# Patient Record
Sex: Female | Born: 1935 | Race: White | Hispanic: No | Marital: Single | Smoking: Never smoker
Health system: Southern US, Community
[De-identification: ages and names within clinical notes are randomized; demographics above are authoritative.]

## PROBLEM LIST (undated history)

## (undated) DIAGNOSIS — K219 Gastro-esophageal reflux disease without esophagitis: Secondary | ICD-10-CM

## (undated) DIAGNOSIS — M1990 Unspecified osteoarthritis, unspecified site: Secondary | ICD-10-CM

## (undated) DIAGNOSIS — R140 Abdominal distension (gaseous): Secondary | ICD-10-CM

## (undated) DIAGNOSIS — R410 Disorientation, unspecified: Secondary | ICD-10-CM

## (undated) DIAGNOSIS — Z923 Personal history of irradiation: Secondary | ICD-10-CM

## (undated) DIAGNOSIS — R519 Headache, unspecified: Secondary | ICD-10-CM

## (undated) DIAGNOSIS — R51 Headache: Secondary | ICD-10-CM

## (undated) DIAGNOSIS — I493 Ventricular premature depolarization: Secondary | ICD-10-CM

## (undated) DIAGNOSIS — R002 Palpitations: Secondary | ICD-10-CM

## (undated) DIAGNOSIS — G4730 Sleep apnea, unspecified: Secondary | ICD-10-CM

## (undated) DIAGNOSIS — I10 Essential (primary) hypertension: Secondary | ICD-10-CM

## (undated) DIAGNOSIS — D0590 Unspecified type of carcinoma in situ of unspecified breast: Principal | ICD-10-CM

## (undated) DIAGNOSIS — F419 Anxiety disorder, unspecified: Secondary | ICD-10-CM

## (undated) DIAGNOSIS — R413 Other amnesia: Secondary | ICD-10-CM

## (undated) DIAGNOSIS — F329 Major depressive disorder, single episode, unspecified: Secondary | ICD-10-CM

## (undated) DIAGNOSIS — C801 Malignant (primary) neoplasm, unspecified: Secondary | ICD-10-CM

## (undated) DIAGNOSIS — K6289 Other specified diseases of anus and rectum: Secondary | ICD-10-CM

## (undated) DIAGNOSIS — E785 Hyperlipidemia, unspecified: Secondary | ICD-10-CM

## (undated) DIAGNOSIS — F32A Depression, unspecified: Secondary | ICD-10-CM

## (undated) HISTORY — DX: Headache, unspecified: R51.9

## (undated) HISTORY — DX: Hyperlipidemia, unspecified: E78.5

## (undated) HISTORY — DX: Unspecified type of carcinoma in situ of unspecified breast: D05.90

## (undated) HISTORY — DX: Other amnesia: R41.3

## (undated) HISTORY — DX: Major depressive disorder, single episode, unspecified: F32.9

## (undated) HISTORY — DX: Headache: R51

## (undated) HISTORY — DX: Anxiety disorder, unspecified: F41.9

## (undated) HISTORY — DX: Ventricular premature depolarization: I49.3

## (undated) HISTORY — DX: Disorientation, unspecified: R41.0

## (undated) HISTORY — DX: Other specified diseases of anus and rectum: K62.89

## (undated) HISTORY — DX: Depression, unspecified: F32.A

## (undated) HISTORY — PX: BREAST BIOPSY: SHX20

## (undated) HISTORY — DX: Essential (primary) hypertension: I10

## (undated) HISTORY — DX: Unspecified osteoarthritis, unspecified site: M19.90

## (undated) HISTORY — DX: Palpitations: R00.2

## (undated) HISTORY — DX: Gastro-esophageal reflux disease without esophagitis: K21.9

## (undated) HISTORY — DX: Abdominal distension (gaseous): R14.0

## (undated) HISTORY — DX: Sleep apnea, unspecified: G47.30

---

## 1976-09-11 HISTORY — PX: ABDOMINAL HYSTERECTOMY: SHX81

## 2002-03-05 ENCOUNTER — Ambulatory Visit (HOSPITAL_BASED_OUTPATIENT_CLINIC_OR_DEPARTMENT_OTHER): Admission: RE | Admit: 2002-03-05 | Discharge: 2002-03-05 | Payer: Self-pay | Admitting: *Deleted

## 2002-03-05 ENCOUNTER — Encounter (INDEPENDENT_AMBULATORY_CARE_PROVIDER_SITE_OTHER): Payer: Self-pay | Admitting: Specialist

## 2003-06-25 ENCOUNTER — Encounter: Payer: Self-pay | Admitting: Gastroenterology

## 2003-06-25 ENCOUNTER — Encounter: Admission: RE | Admit: 2003-06-25 | Discharge: 2003-06-25 | Payer: Self-pay | Admitting: Gastroenterology

## 2003-09-12 HISTORY — PX: FOOT SURGERY: SHX648

## 2003-10-13 ENCOUNTER — Ambulatory Visit (HOSPITAL_COMMUNITY): Admission: RE | Admit: 2003-10-13 | Discharge: 2003-10-13 | Payer: Self-pay | Admitting: Radiology

## 2004-02-09 ENCOUNTER — Encounter: Admission: RE | Admit: 2004-02-09 | Discharge: 2004-02-09 | Payer: Self-pay | Admitting: Gastroenterology

## 2005-09-11 HISTORY — PX: ROTATOR CUFF REPAIR: SHX139

## 2006-01-03 ENCOUNTER — Ambulatory Visit: Payer: Self-pay | Admitting: Family Medicine

## 2006-04-13 ENCOUNTER — Ambulatory Visit (HOSPITAL_BASED_OUTPATIENT_CLINIC_OR_DEPARTMENT_OTHER): Admission: RE | Admit: 2006-04-13 | Discharge: 2006-04-14 | Payer: Self-pay | Admitting: Orthopedic Surgery

## 2009-02-05 DIAGNOSIS — E7800 Pure hypercholesterolemia, unspecified: Secondary | ICD-10-CM | POA: Insufficient documentation

## 2009-02-05 DIAGNOSIS — I10 Essential (primary) hypertension: Secondary | ICD-10-CM | POA: Insufficient documentation

## 2009-02-05 DIAGNOSIS — F419 Anxiety disorder, unspecified: Secondary | ICD-10-CM | POA: Insufficient documentation

## 2009-09-11 HISTORY — PX: KIDNEY STONE SURGERY: SHX686

## 2009-09-11 HISTORY — PX: CATARACT EXTRACTION: SUR2

## 2010-07-12 ENCOUNTER — Ambulatory Visit (HOSPITAL_BASED_OUTPATIENT_CLINIC_OR_DEPARTMENT_OTHER): Admission: RE | Admit: 2010-07-12 | Discharge: 2010-07-12 | Payer: Self-pay | Admitting: Urology

## 2010-09-11 DIAGNOSIS — C50919 Malignant neoplasm of unspecified site of unspecified female breast: Secondary | ICD-10-CM

## 2010-09-11 HISTORY — DX: Malignant neoplasm of unspecified site of unspecified female breast: C50.919

## 2010-09-21 ENCOUNTER — Other Ambulatory Visit: Payer: Self-pay | Admitting: Radiology

## 2010-09-27 ENCOUNTER — Encounter
Admission: RE | Admit: 2010-09-27 | Discharge: 2010-09-27 | Payer: Self-pay | Source: Home / Self Care | Attending: Radiology | Admitting: Radiology

## 2010-10-12 HISTORY — PX: OTHER SURGICAL HISTORY: SHX169

## 2010-10-17 ENCOUNTER — Ambulatory Visit (HOSPITAL_COMMUNITY)
Admission: RE | Admit: 2010-10-17 | Discharge: 2010-10-17 | Disposition: A | Discharge status: Home / Self Care | Payer: Medicare Other | Source: Ambulatory Visit | Attending: General Surgery | Admitting: General Surgery

## 2010-10-17 ENCOUNTER — Encounter (HOSPITAL_COMMUNITY)
Admission: RE | Admit: 2010-10-17 | Discharge: 2010-10-17 | Disposition: A | Discharge status: Home / Self Care | Payer: Medicare Other | Source: Ambulatory Visit | Attending: General Surgery | Admitting: General Surgery

## 2010-10-17 ENCOUNTER — Other Ambulatory Visit (HOSPITAL_COMMUNITY): Payer: Self-pay

## 2010-10-17 ENCOUNTER — Other Ambulatory Visit (HOSPITAL_COMMUNITY): Payer: Self-pay | Admitting: General Surgery

## 2010-10-17 DIAGNOSIS — Z01811 Encounter for preprocedural respiratory examination: Secondary | ICD-10-CM

## 2010-10-17 DIAGNOSIS — C50919 Malignant neoplasm of unspecified site of unspecified female breast: Secondary | ICD-10-CM | POA: Insufficient documentation

## 2010-10-17 LAB — URINALYSIS, ROUTINE W REFLEX MICROSCOPIC
Bilirubin Urine: NEGATIVE
Ketones, ur: NEGATIVE mg/dL
Nitrite: NEGATIVE
Protein, ur: NEGATIVE mg/dL
Urine Glucose, Fasting: NEGATIVE mg/dL

## 2010-10-17 LAB — COMPREHENSIVE METABOLIC PANEL
ALT: 18 U/L (ref 0–35)
Alkaline Phosphatase: 76 U/L (ref 39–117)
CO2: 30 mEq/L (ref 19–32)
GFR calc non Af Amer: >60 mL/min (ref >60)
Glucose, Bld: 121 mg/dL — ABNORMAL HIGH (ref 70–99)
Potassium: 4.6 mEq/L (ref 3.5–5.1)
Sodium: 143 mEq/L (ref 135–145)

## 2010-10-17 LAB — CBC
HCT: 37.5 % (ref 36.0–46.0)
Hemoglobin: 12.7 g/dL (ref 12.0–15.0)
RBC: 4.28 MIL/uL (ref 3.87–5.11)
WBC: 6.5 10*3/uL (ref 4.0–10.5)

## 2010-10-17 LAB — DIFFERENTIAL
Basophils Absolute: 0 10*3/uL (ref 0.0–0.1)
Lymphocytes Relative: 20 % (ref 12–46)
Neutro Abs: 4.6 10*3/uL (ref 1.7–7.7)

## 2010-10-18 ENCOUNTER — Ambulatory Visit (HOSPITAL_COMMUNITY)
Admission: RE | Admit: 2010-10-18 | Discharge: 2010-10-18 | Disposition: A | Discharge status: Home / Self Care | Payer: Medicare Other | Source: Ambulatory Visit | Attending: General Surgery | Admitting: General Surgery

## 2010-10-18 DIAGNOSIS — K219 Gastro-esophageal reflux disease without esophagitis: Secondary | ICD-10-CM | POA: Insufficient documentation

## 2010-10-18 DIAGNOSIS — I10 Essential (primary) hypertension: Secondary | ICD-10-CM | POA: Insufficient documentation

## 2010-10-18 DIAGNOSIS — G4733 Obstructive sleep apnea (adult) (pediatric): Secondary | ICD-10-CM | POA: Insufficient documentation

## 2010-10-18 DIAGNOSIS — D0590 Unspecified type of carcinoma in situ of unspecified breast: Secondary | ICD-10-CM | POA: Insufficient documentation

## 2010-10-19 ENCOUNTER — Other Ambulatory Visit: Payer: Self-pay | Admitting: General Surgery

## 2010-10-19 ENCOUNTER — Other Ambulatory Visit (HOSPITAL_COMMUNITY): Payer: Self-pay | Admitting: General Surgery

## 2010-10-19 DIAGNOSIS — R911 Solitary pulmonary nodule: Secondary | ICD-10-CM

## 2010-10-21 ENCOUNTER — Other Ambulatory Visit (HOSPITAL_COMMUNITY): Payer: Self-pay | Admitting: General Surgery

## 2010-10-21 ENCOUNTER — Ambulatory Visit (HOSPITAL_COMMUNITY)
Admission: RE | Admit: 2010-10-21 | Discharge: 2010-10-21 | Disposition: A | Discharge status: Home / Self Care | Payer: Medicare Other | Source: Ambulatory Visit | Attending: General Surgery | Admitting: General Surgery

## 2010-10-21 ENCOUNTER — Encounter (HOSPITAL_COMMUNITY): Payer: Self-pay

## 2010-10-21 DIAGNOSIS — J984 Other disorders of lung: Secondary | ICD-10-CM | POA: Insufficient documentation

## 2010-10-21 DIAGNOSIS — I2510 Atherosclerotic heart disease of native coronary artery without angina pectoris: Secondary | ICD-10-CM | POA: Insufficient documentation

## 2010-10-21 DIAGNOSIS — R911 Solitary pulmonary nodule: Secondary | ICD-10-CM

## 2010-10-21 HISTORY — DX: Essential (primary) hypertension: I10

## 2010-10-21 HISTORY — DX: Malignant (primary) neoplasm, unspecified: C80.1

## 2010-10-21 MED ORDER — IOHEXOL 300 MG/ML  SOLN
80.0000 mL | Freq: Once | INTRAMUSCULAR | Status: AC | PRN
  Administered 2010-10-21: 80 mL via INTRAVENOUS

## 2010-10-23 NOTE — Op Note (Signed)
Brandi Hanson, Brandi Hanson NO.:  [id number]  MEDICAL RECORD NO.:  [id number]           PATIENT TYPE:  LOCATION:                                 FACILITY:  PHYSICIAN:  Angelia Mould. Derrell Lolling, M.D.DATE OF BIRTH:  1935/10/21  DATE OF PROCEDURE:  10/18/2010 DATE OF DISCHARGE:                              OPERATIVE REPORT   PREOPERATIVE DIAGNOSIS:  Ductal carcinoma in situ, left breast, stage Tis N0.  POSTOPERATIVE DIAGNOSIS:  Ductal carcinoma in situ, left breast, stage Tis N0.  OPERATIONS PERFORMED: 1. Left partial mastectomy with needle localization and specimen     mammogram. 2. Re-excision of medial margin.  SURGEON:  Angelia Mould. Derrell Lolling, MD  OPERATIVE INDICATIONS:  This is a 75 year old Caucasian female who has had a couple of breast biopsies in the past but no prior cancer.  Recent screening mammogram showed a 4-mm area of microcalcifications in the central left breast in the retroareolar area.  This was a very small area.  Core biopsy showed a ductal carcinoma in situ which was receptor positive.  I have discussed her care on two occasions with her.  She had an MRI which showed a biopsy cavity measuring 2 cm x 1.6 cm and a biopsy clip in appropriate area immediately adjacent to the microcalcifications.  This was in the left retroareolar area at the junction of the middle and posterior one third of breast.  She would like to try for breast conservation and is brought to the operating room electively.  OPERATIVE TECHNIQUE:  The patient underwent wire localization by Dr. Rogelia Mire at the [facility].  The wire entered laterally and was directed medially and went right through the marker clip in the area of microcalcifications and was well placed.  The patient was brought to the [facility] Operating Room.  She was taken to the operating room where general anesthesia was induced.  The left breast was prepped and draped in a sterile fashion.  Intravenous  antibiotics were given.  A surgical time-out was held identifying correct patient, correct procedure, and correct site.  I reviewed the x-rays and put them up to review them.  I felt that the best incision was going to be a curved transverse circumareolar incision and I placed it superiorly where the most lateral aspect of the incision was near the insertion site of the wire.  This incision was made. Dissection was carried down into the breast tissue, and I did fairly extensive dissection medially and took the dissection all the way back to the chest wall in most places.  When I came down 2 cm medial to the nipple, I transected the very tip of the wire.  I removed the specimen and marked it with a 6-color margin marker kit.  I also re-excised the medial margin taking about 2 cm x 3 cm rectangle of tissue and also marked it with the yellow color for the medial margin and marked it as re-excision medial margin, yellow color indicating new medial margin.  I placed both of these specimens in the Faxitron and looked at the pictures.  It looked like the marker  clip and the microcalcifications were actually in the main specimen but saw the hook of the wire in the second specimen.  These were sent to Dr. Tilda Burrow at [facility] and we discussed the case.  We felt that we had removed everything that needed to be removed.  The specimens were sent with careful notations to the pathologist.  Hemostasis was excellent and achieved with electrocautery.  The wound was irrigated with saline.  The deeper breast tissues were closed in layers with interrupted sutures of 3-0 Vicryl and the skin closed with a running subcuticular suture of 4-0 Monocryl and Dermabond.  Ice pack was placed.  The patient tolerated the procedure well and was taken to recovery room in stable condition.  Estimated blood loss was about 10-15 mL.  Complications none.  Sponge, needles, and instrument counts  were correct.     Angelia Mould. Derrell Lolling, M.D.     HMI/MEDQ  D:  10/18/2010  T:  10/19/2010  Job:  [id number]  cc:   Harrietta Guardian. Little, M.D. Dr. Rogelia Mire  Electronically Signed by Claud Kelp M.D. on 10/23/2010 03:59:21 PM

## 2010-11-02 ENCOUNTER — Encounter (HOSPITAL_BASED_OUTPATIENT_CLINIC_OR_DEPARTMENT_OTHER): Payer: Medicare Other | Admitting: Oncology

## 2010-11-02 ENCOUNTER — Encounter: Payer: Medicare Other | Admitting: Oncology

## 2010-11-02 DIAGNOSIS — C50119 Malignant neoplasm of central portion of unspecified female breast: Secondary | ICD-10-CM

## 2010-11-18 ENCOUNTER — Ambulatory Visit: Payer: Medicare Other | Attending: Radiation Oncology | Admitting: Radiation Oncology

## 2010-11-18 DIAGNOSIS — L538 Other specified erythematous conditions: Secondary | ICD-10-CM | POA: Insufficient documentation

## 2010-11-18 DIAGNOSIS — C50119 Malignant neoplasm of central portion of unspecified female breast: Secondary | ICD-10-CM | POA: Insufficient documentation

## 2010-11-18 DIAGNOSIS — Z510 Encounter for antineoplastic radiation therapy: Secondary | ICD-10-CM | POA: Insufficient documentation

## 2010-11-18 DIAGNOSIS — M25519 Pain in unspecified shoulder: Secondary | ICD-10-CM | POA: Insufficient documentation

## 2010-11-22 LAB — POCT I-STAT 4, (NA,K, GLUC, HGB,HCT)
HCT: 41 % (ref 36.0–46.0)
Hemoglobin: 13.9 g/dL (ref 12.0–15.0)
Potassium: 4.1 mEq/L (ref 3.5–5.1)

## 2010-12-27 ENCOUNTER — Other Ambulatory Visit: Payer: Self-pay | Admitting: Oncology

## 2010-12-27 ENCOUNTER — Encounter (HOSPITAL_BASED_OUTPATIENT_CLINIC_OR_DEPARTMENT_OTHER): Payer: Medicare Other | Admitting: Oncology

## 2010-12-27 DIAGNOSIS — C50119 Malignant neoplasm of central portion of unspecified female breast: Secondary | ICD-10-CM

## 2010-12-27 DIAGNOSIS — D0590 Unspecified type of carcinoma in situ of unspecified breast: Secondary | ICD-10-CM

## 2010-12-27 DIAGNOSIS — Z170 Estrogen receptor positive status [ER+]: Secondary | ICD-10-CM

## 2010-12-27 LAB — CBC WITH DIFFERENTIAL/PLATELET
BASO%: 0.3 % (ref 0.0–2.0)
EOS%: 1.7 % (ref 0.0–7.0)
HCT: 35.7 % (ref 34.8–46.6)
MCHC: 34.6 g/dL (ref 31.5–36.0)
MONO#: 0.6 10*3/uL (ref 0.1–0.9)
NEUT%: 73 % (ref 38.4–76.8)
RDW: 12.6 % (ref 11.2–14.5)
WBC: 4.2 10*3/uL (ref 3.9–10.3)
lymph#: 0.5 10*3/uL — ABNORMAL LOW (ref 0.9–3.3)

## 2010-12-27 LAB — COMPREHENSIVE METABOLIC PANEL
ALT: 23 U/L (ref 0–35)
AST: 27 U/L (ref 0–37)
Albumin: 4.2 g/dL (ref 3.5–5.2)
CO2: 31 mEq/L (ref 19–32)
Calcium: 9.6 mg/dL (ref 8.4–10.5)
Chloride: 103 mEq/L (ref 96–112)
Potassium: 4.4 mEq/L (ref 3.5–5.3)
Sodium: 140 mEq/L (ref 135–145)
Total Protein: 6.2 g/dL (ref 6.0–8.3)

## 2010-12-27 LAB — LACTATE DEHYDROGENASE: LDH: 147 U/L (ref 94–250)

## 2011-01-27 NOTE — Op Note (Signed)
NAMELAQUESHIA, CIHLAR NO.:  [id number]   MEDICAL RECORD NO.:  [id number]          PATIENT TYPE:  AMB   LOCATION:  DSC                          FACILITY:  [facility]   PHYSICIAN:  Dyke Brackett, M.D.    DATE OF BIRTH:  [birth year]   DATE OF PROCEDURE:  04/13/2006  DATE OF DISCHARGE:  04/14/2006                                 OPERATIVE REPORT   PREOPERATIVE DIAGNOSIS:  Large rotator cuff tear, right shoulder.   POSTOPERATIVE DIAGNOSIS:  Large rotator cuff tear, right shoulder.   OPERATION:  1. Right shoulder open rotator cuff repair with acromioplasty.  2. Arthroscopic debridement, labrum.  3. Open distal clavicle excision.   SURGEON:  Dyke Brackett, M.D.   ASSISTANT:  Arlys John D. Petrarca, P.A.-C.   DESCRIPTION OF PROCEDURE:  Sterile prep and drape beach chair, posterior and  anterior portals created.  A large rotator cuff tear identified with  complete tear of the supraspinatus and significant tear.  Complete but not  full width thickness of the infraspinatus was noted with significant  retraction.  Degenerative tearing of the anterior superior labrum was noted  and debrided separate from the open procedure.  No significant glenohumeral  degenerative change was seen.   Procedure was next converted to an open procedure with excision of a very  prominent acromion and AC joint area followed by exposure of the large tear,  mobilization and freshening with a 15 blade.  The 5.5 Arthrex anchors were  used to create essentially a watertight repair, with 2 to 3 anchors, for a  total of about 6 sutures.  __________ the tear was more of an L-shaped tear  that was converted basically to anatomic repair under no significant  tension.  Closure was affected with #1 Tycron on the deltoid, #2-0 Vicryl on  the subcutaneous tissues, a light compressive sterile dressing and sling  applied.  Taken to the recovery room in stable condition.      Dyke Brackett, M.D.  Electronically  Signed     WDC/MEDQ  D:  05/16/2006  T:  05/16/2006  Job:  [id number]

## 2011-02-23 ENCOUNTER — Ambulatory Visit
Admission: RE | Admit: 2011-02-23 | Discharge: 2011-02-23 | Disposition: A | Discharge status: Home / Self Care | Payer: Medicare Other | Source: Ambulatory Visit | Attending: Radiation Oncology | Admitting: Radiation Oncology

## 2011-02-23 DIAGNOSIS — C50119 Malignant neoplasm of central portion of unspecified female breast: Secondary | ICD-10-CM

## 2011-02-23 LAB — COMPREHENSIVE METABOLIC PANEL
ALT: 13 U/L (ref 0–35)
AST: 17 U/L (ref 0–37)
Albumin: 4.2 g/dL (ref 3.5–5.2)
Alkaline Phosphatase: 76 U/L (ref 39–117)
BUN: 19 mg/dL (ref 6–23)
Calcium: 9.3 mg/dL (ref 8.4–10.5)
Chloride: 103 mEq/L (ref 96–112)
Creatinine, Ser: 0.66 mg/dL (ref 0.50–1.10)
Potassium: 4.2 mEq/L (ref 3.5–5.3)

## 2011-02-23 LAB — CBC WITH DIFFERENTIAL/PLATELET
BASO%: 0.4 % (ref 0.0–2.0)
EOS%: 0.8 % (ref 0.0–7.0)
MCH: 30.6 pg (ref 25.1–34.0)
MCHC: 34.5 g/dL (ref 31.5–36.0)
MCV: 88.6 fL (ref 79.5–101.0)
MONO%: 12.3 % (ref 0.0–14.0)
RDW: 12.4 % (ref 11.2–14.5)
lymph#: 0.6 10*3/uL — ABNORMAL LOW (ref 0.9–3.3)

## 2011-03-07 ENCOUNTER — Encounter (HOSPITAL_BASED_OUTPATIENT_CLINIC_OR_DEPARTMENT_OTHER): Payer: Medicare Other | Admitting: Oncology

## 2011-03-07 DIAGNOSIS — Z170 Estrogen receptor positive status [ER+]: Secondary | ICD-10-CM

## 2011-03-07 DIAGNOSIS — D0590 Unspecified type of carcinoma in situ of unspecified breast: Secondary | ICD-10-CM

## 2011-05-19 ENCOUNTER — Telehealth (INDEPENDENT_AMBULATORY_CARE_PROVIDER_SITE_OTHER): Payer: Self-pay

## 2011-05-19 NOTE — Telephone Encounter (Signed)
Pt called c/o of bloody nipple d/c right breast. Last imaging done was mri done jan 2012/ mgm done in 2011. Pt advised I will review this with Dr Derrell Lolling on Monday to see if he wants imaging possible ductogram prior to appt 9-24/

## 2011-05-22 ENCOUNTER — Telehealth (INDEPENDENT_AMBULATORY_CARE_PROVIDER_SITE_OTHER): Payer: Self-pay

## 2011-05-22 DIAGNOSIS — N6452 Nipple discharge: Secondary | ICD-10-CM

## 2011-05-22 NOTE — Telephone Encounter (Signed)
Pt was called and advised per Dr Derrell Lolling, prior to ov 9-24 she will need mgm,ultrasound and poss ductogram set up at [facility]. Pt wishes to proceed with these test.

## 2011-05-30 ENCOUNTER — Other Ambulatory Visit: Payer: Self-pay | Admitting: Radiology

## 2011-06-02 ENCOUNTER — Encounter (INDEPENDENT_AMBULATORY_CARE_PROVIDER_SITE_OTHER): Payer: Self-pay | Admitting: General Surgery

## 2011-06-05 ENCOUNTER — Encounter (INDEPENDENT_AMBULATORY_CARE_PROVIDER_SITE_OTHER): Payer: Self-pay | Admitting: General Surgery

## 2011-06-05 ENCOUNTER — Ambulatory Visit (INDEPENDENT_AMBULATORY_CARE_PROVIDER_SITE_OTHER): Payer: Medicare Other | Admitting: General Surgery

## 2011-06-05 VITALS — BP 128/68 | HR 60 | Temp 98.0°F | Resp 14 | Ht 67.0 in | Wt 145.0 lb

## 2011-06-05 DIAGNOSIS — N6452 Nipple discharge: Secondary | ICD-10-CM

## 2011-06-05 DIAGNOSIS — N6459 Other signs and symptoms in breast: Secondary | ICD-10-CM

## 2011-06-05 NOTE — Patient Instructions (Signed)
The small density seen in your right breast appears to be completely benign. Since the bleeding happened only once, I do not think that we need to do a biopsy at this time. I will see you back in  in 6 months after you get mammograms and ultrasounds.

## 2011-06-05 NOTE — Progress Notes (Signed)
Subjective:     Patient ID: Brandi Hanson, female   DOB: Dec 03, 1935, 75 y.o.   MRN: [id number]  HPI This is a 75 year old Caucasian female. She is seen in the office today upon referral by Brandi Hanson. She is being evaluated because of a nipple discharge in the right breast.  The patient has a significant history of left partial mastectomy on October 18, 2010. She had high-grade ductal carcinoma in situ, 1.5 cm, ER positive, PR negative. She underwent adjuvant radiation therapy by Dr. Michell Hanson. She is taking Femara and being followed by Dr. Cephas Hanson. She has no complaints about the left breast.  In terms of the right breast, she states that on May 16, 2011 she saw a spot of dark fluid in her bra. She assumes that she had a discharge from the right nipple. She has had no more discharge since that time. She does not feel a mass and is not have any pain.  Bilateral mammograms and bilateral ultrasounds were performed by Brandi Hanson on May 29, 2011. It was felt that there was a 4 mm mass immediately adjacent to the right nipple. She felt this appeared to be located within a duct. The following day she performed a biopsy of this area and felt that she got adequate tissue. The surgical pathology shows benign adipose tissue, no evidence of atypia or malignancy. After Brandi Hanson felt that the results were concordant with the imaging findings.  Patient is here for my opinion.  Past Medical History  Diagnosis Date  . Hypertension   . PVC (premature ventricular contraction)   . Hyperlipidemia   . Arthritis   . Sleep apnea   . GERD (gastroesophageal reflux disease)   . Cancer     skin  . Anxiety   . Osteoporosis   . Abdominal pain   . Abdominal distention   . Palpitations   . Generalized headaches   . Confusion   . Rectal pain   . Current Outpatient Prescriptions  Medication Sig Dispense Refill  . acetaminophen (TYLENOL) 500 MG tablet Take 500 mg by mouth every 6  (six) hours as needed.        Marland Kitchen aspirin 81 MG tablet Take 81 mg by mouth daily.        Marland Kitchen CALCIUM CITRATE-VITAMIN D PO Take by mouth daily.        . Cholecalciferol (VITAMIN D3) 1000 UNITS CAPS Take by mouth daily.        . clidinium-chlordiazePOXIDE (LIBRAX) 2.5-5 MG per capsule Take 1 capsule by mouth 2 (two) times daily.        . Coenzyme Q10 (COQ10) 100 MG CAPS Take by mouth daily.        Marland Kitchen letrozole (FEMARA) 2.5 MG tablet       . lisinopril (PRINIVIL,ZESTRIL) 10 MG tablet Take 10 mg by mouth daily.        . Multiple Vitamin (STRESS B) TABS Take by mouth daily.        . Naproxen Sodium (ALEVE PO) Take by mouth as needed.        Marland Kitchen omeprazole (PRILOSEC) 20 MG capsule Take 20 mg by mouth daily.        . potassium citrate (UROCIT-K) 10 MEQ (1080 MG) SR tablet Take 10 mEq by mouth 2 (two) times daily.        . simvastatin (ZOCOR) 20 MG tablet Take 20 mg by mouth every other day.         No Known Allergies  Review of Systems 12 system review of systems is performed and is negative except as described above.     Objective:   Physical Exam  Constitutional: She appears well-developed and well-nourished. No distress.  Eyes: Conjunctivae are normal. Pupils are equal, round, and reactive to light. Left eye exhibits no discharge. No scleral icterus.  Neck: Normal range of motion. Neck supple. No JVD present. No tracheal deviation present. No thyromegaly present.  Cardiovascular: Normal rate, regular rhythm and normal heart sounds.   No murmur heard. Pulmonary/Chest: Effort normal and breath sounds normal. No respiratory distress. She has no wheezes. She has no rales. She exhibits no tenderness.    Lymphadenopathy:    She has no cervical adenopathy.  Skin: She is not diaphoretic.       Assessment:     Single episode, presumed bloody right nipple discharge. Considering imaging and biopsy findings, this is low risk.  There is a very, very small chance that this could be an intraductal  papilloma.  History ductal carcinoma in situ left breast, 1.5 cm, receptor positive. No evidence of recurrence 7 months following left partial mastectomy and adjuvant radiation therapy and adjuvant tomorrow.    Plan:     I had a long discussion with the patient, and she is comfortable with close clinical followup.  She is aware that there is a very small chance this could be an intraductal papilloma and that further excisional biopsy may be required in the future.  She is going to repeat imaging studies in 6 months and followup with me at that time.  If the mass enlarges we certainly will be to excise this area. If she develops any recurrent bloody nipple discharge I would probably proceed with excisional biopsy at that time and not wait. She is aware of all this.  I will see her in 6 months

## 2011-06-15 ENCOUNTER — Encounter: Payer: Self-pay | Admitting: General Surgery

## 2011-06-27 ENCOUNTER — Other Ambulatory Visit: Payer: Self-pay | Admitting: Oncology

## 2011-06-27 ENCOUNTER — Encounter (HOSPITAL_BASED_OUTPATIENT_CLINIC_OR_DEPARTMENT_OTHER): Payer: BC Managed Care – PPO | Admitting: Oncology

## 2011-06-27 DIAGNOSIS — C50119 Malignant neoplasm of central portion of unspecified female breast: Secondary | ICD-10-CM

## 2011-06-27 LAB — CBC WITH DIFFERENTIAL/PLATELET
Eosinophils Absolute: 0.1 10*3/uL (ref 0.0–0.5)
HCT: 37.8 % (ref 34.8–46.6)
LYMPH%: 22.4 % (ref 14.0–49.7)
MCV: 90.4 fL (ref 79.5–101.0)
MONO#: 0.7 10*3/uL (ref 0.1–0.9)
NEUT#: 3.9 10*3/uL (ref 1.5–6.5)
NEUT%: 65.3 % (ref 38.4–76.8)
Platelets: 212 10*3/uL (ref 145–400)
WBC: 6 10*3/uL (ref 3.9–10.3)

## 2011-06-27 LAB — COMPREHENSIVE METABOLIC PANEL
BUN: 17 mg/dL (ref 6–23)
CO2: 27 mEq/L (ref 19–32)
Creatinine, Ser: 0.71 mg/dL (ref 0.50–1.10)
Glucose, Bld: 95 mg/dL (ref 70–99)
Total Bilirubin: 0.4 mg/dL (ref 0.3–1.2)

## 2011-06-27 LAB — LACTATE DEHYDROGENASE: LDH: 149 U/L (ref 94–250)

## 2011-07-04 ENCOUNTER — Encounter (HOSPITAL_BASED_OUTPATIENT_CLINIC_OR_DEPARTMENT_OTHER): Payer: Medicare Other | Admitting: Oncology

## 2011-07-04 DIAGNOSIS — B029 Zoster without complications: Secondary | ICD-10-CM

## 2011-07-04 DIAGNOSIS — D0590 Unspecified type of carcinoma in situ of unspecified breast: Secondary | ICD-10-CM

## 2011-07-04 DIAGNOSIS — Z170 Estrogen receptor positive status [ER+]: Secondary | ICD-10-CM

## 2011-07-04 DIAGNOSIS — C50119 Malignant neoplasm of central portion of unspecified female breast: Secondary | ICD-10-CM

## 2011-07-22 ENCOUNTER — Other Ambulatory Visit: Payer: Self-pay | Admitting: Oncology

## 2011-07-22 ENCOUNTER — Encounter: Payer: Self-pay | Admitting: Oncology

## 2011-07-22 DIAGNOSIS — D0590 Unspecified type of carcinoma in situ of unspecified breast: Secondary | ICD-10-CM

## 2011-07-22 HISTORY — DX: Unspecified type of carcinoma in situ of unspecified breast: D05.90

## 2011-07-24 ENCOUNTER — Ambulatory Visit (HOSPITAL_BASED_OUTPATIENT_CLINIC_OR_DEPARTMENT_OTHER): Payer: Medicare Other

## 2011-07-24 ENCOUNTER — Other Ambulatory Visit: Payer: Self-pay | Admitting: Oncology

## 2011-07-24 ENCOUNTER — Other Ambulatory Visit: Payer: Medicare Other | Admitting: Lab

## 2011-07-24 VITALS — BP 139/83 | HR 105 | Temp 98.0°F

## 2011-07-24 DIAGNOSIS — D0590 Unspecified type of carcinoma in situ of unspecified breast: Secondary | ICD-10-CM

## 2011-07-24 DIAGNOSIS — M899 Disorder of bone, unspecified: Secondary | ICD-10-CM

## 2011-07-24 LAB — BASIC METABOLIC PANEL
Calcium: 9.4 mg/dL (ref 8.4–10.5)
Chloride: 103 mEq/L (ref 96–112)
Creatinine, Ser: 0.64 mg/dL (ref 0.50–1.10)

## 2011-07-24 MED ORDER — ZOLEDRONIC ACID 4 MG/100ML IV SOLN
4.0000 mg | Freq: Once | INTRAVENOUS | Status: AC
  Administered 2011-07-24: 4 mg via INTRAVENOUS
  Filled 2011-07-24: qty 100

## 2011-07-24 MED ORDER — SODIUM CHLORIDE 0.9 % IV SOLN
Freq: Once | INTRAVENOUS | Status: AC
  Administered 2011-07-24: 14:00:00 via INTRAVENOUS

## 2011-08-08 ENCOUNTER — Other Ambulatory Visit: Payer: Self-pay | Admitting: *Deleted

## 2011-08-08 DIAGNOSIS — C50119 Malignant neoplasm of central portion of unspecified female breast: Secondary | ICD-10-CM

## 2011-08-08 MED ORDER — LETROZOLE 2.5 MG PO TABS: 2.5000 mg | ORAL_TABLET | Freq: Every day | ORAL | Status: DC

## 2011-09-12 HISTORY — PX: BREAST BIOPSY: SHX20

## 2011-10-12 ENCOUNTER — Telehealth (INDEPENDENT_AMBULATORY_CARE_PROVIDER_SITE_OTHER): Payer: Self-pay | Admitting: General Surgery

## 2011-12-04 ENCOUNTER — Ambulatory Visit (INDEPENDENT_AMBULATORY_CARE_PROVIDER_SITE_OTHER): Payer: Medicare Other | Admitting: General Surgery

## 2011-12-04 ENCOUNTER — Encounter (INDEPENDENT_AMBULATORY_CARE_PROVIDER_SITE_OTHER): Payer: Self-pay | Admitting: General Surgery

## 2011-12-04 VITALS — BP 138/80 | HR 100 | Temp 99.0°F | Resp 18 | Ht 67.0 in | Wt 143.6 lb

## 2011-12-04 DIAGNOSIS — Z87898 Personal history of other specified conditions: Secondary | ICD-10-CM

## 2011-12-04 NOTE — Patient Instructions (Signed)
Your breast exam today is normal on both sides. There is no abnormal skin changes and there is no nipple discharge. The recent mammograms of the right breast looked normal. I do not think anything further needs to be done.  Keep your appointments with Dr. Cyndie Chime. Continue to take the Femara pill.  Return to see Dr. Derrell Lolling in October, 2013 after  mammograms.

## 2011-12-04 NOTE — Progress Notes (Signed)
Patient ID: Brandi Hanson, female   DOB: 1936/03/22, 76 y.o.   MRN: [id number]  Chief Complaint  Patient presents with  . Follow-up    f/u mgm breast check    HPI Brandi Hanson is a 76 y.o. female.  She returns for interval followup.  This patient underwent left partial mastectomy with reexcision of medial margin on October 18, 2010. Final pathology showed high-grade DCIS, 1.5 cm, negative margins, receptor positive. She had adjuvant radiation therapy with Dr. Doreen Beam. She is being followed by Dr. Cephas Darby and is on adjuvant Femara and is tolerating that well.  She was last seen in September 2012 with a single reported episode of right nipple discharge. She had a biopsy of a retroareolar density which was negative. We elected to follow her closely. She has had no further drainage or concerns.  Right breast mammogram performed November 28, 2011 is negative, category one. She has been scheduled for bilateral mammograms in September of 2013. HPI  Past Medical History  Diagnosis Date  . Hypertension   . PVC (premature ventricular contraction)   . Hyperlipidemia   . Arthritis   . Sleep apnea   . GERD (gastroesophageal reflux disease)   . Cancer     skin  . Anxiety   . Osteoporosis   . Abdominal pain   . Abdominal distention   . Palpitations   . Generalized headaches   . Confusion   . Rectal pain   . Breast cancer in situ 07/22/2011    Past Surgical History  Procedure Date  . Abdominal hysterectomy 1978  . Breast biopsy 1990, 1994    left  . Foot surgery 2005  . Rotator cuff repair 2007  . Cataract extraction 2011    bilateral  . Kidney stone surgery 2011    Family History  Problem Relation Age of Onset  . Cancer Mother     breast  . Cancer Father     lung  . Cancer Sister     breast and uterine    Social History History  Substance Use Topics  . Smoking status: Never Smoker   . Smokeless tobacco: Not on file  . Alcohol Use: No    No Known  Allergies  Current Outpatient Prescriptions  Medication Sig Dispense Refill  . escitalopram (LEXAPRO) 10 MG tablet Take 5 mg by mouth 2 (two) times daily.      . nitrofurantoin (MACRODANTIN) 100 MG capsule Take 100 mg by mouth 4 (four) times daily.      Marland Kitchen acetaminophen (TYLENOL) 500 MG tablet Take 500 mg by mouth every 6 (six) hours as needed.        Marland Kitchen aspirin 81 MG tablet Take 81 mg by mouth daily.        Marland Kitchen CALCIUM CITRATE-VITAMIN D PO Take by mouth daily.        . Cholecalciferol (VITAMIN D3) 1000 UNITS CAPS Take by mouth daily.        . clidinium-chlordiazePOXIDE (LIBRAX) 2.5-5 MG per capsule Take 1 capsule by mouth 2 (two) times daily.        . Coenzyme Q10 (COQ10) 100 MG CAPS Take by mouth daily.        Marland Kitchen letrozole (FEMARA) 2.5 MG tablet Take 1 tablet (2.5 mg total) by mouth daily.  30 tablet  5  . lisinopril (PRINIVIL,ZESTRIL) 10 MG tablet Take 10 mg by mouth daily.        . Multiple Vitamin (STRESS B) TABS Take by mouth daily.        Marland Kitchen  omeprazole (PRILOSEC) 20 MG capsule Take 20 mg by mouth daily.        . potassium citrate (UROCIT-K) 10 MEQ (1080 MG) SR tablet Take 10 mEq by mouth 2 (two) times daily.        . simvastatin (ZOCOR) 20 MG tablet Take 20 mg by mouth every other day.          Review of Systems Review of Systems  Constitutional: Negative for fever, chills and unexpected weight change.  HENT: Negative for hearing loss, congestion, sore throat, trouble swallowing and voice change.   Eyes: Negative for visual disturbance.  Respiratory: Negative for cough and wheezing.   Cardiovascular: Negative for chest pain, palpitations and leg swelling.  Gastrointestinal: Negative for nausea, vomiting, abdominal pain, diarrhea, constipation, blood in stool, abdominal distention and anal bleeding.  Genitourinary: Negative for hematuria, vaginal bleeding and difficulty urinating.  Musculoskeletal: Negative for arthralgias.  Skin: Negative for rash and wound.  Neurological: Negative  for seizures, syncope and headaches.  Hematological: Negative for adenopathy. Does not bruise/bleed easily.  Psychiatric/Behavioral: Negative for confusion.    Blood pressure 138/80, pulse 100, temperature 99 F (37.2 C), resp. rate 18, height 5\' 7"  (1.702 m), weight 143 lb 9.6 oz (65.137 kg).  Physical Exam Physical Exam  Constitutional: She is oriented to person, place, and time. She appears well-developed and well-nourished. No distress.  HENT:  Head: Normocephalic and atraumatic.  Neck: Neck supple. No JVD present. No tracheal deviation present. No thyromegaly present.  Cardiovascular: Normal rate, regular rhythm, normal heart sounds and intact distal pulses.   Pulmonary/Chest: Effort normal and breath sounds normal. No respiratory distress. She has no wheezes. She has no rales. She exhibits no tenderness.    Musculoskeletal: She exhibits no edema and no tenderness.  Lymphadenopathy:    She has no cervical adenopathy.  Neurological: She is alert and oriented to person, place, and time. She exhibits normal muscle tone. Coordination normal.  Skin: Skin is warm. No rash noted. She is not diaphoretic. No erythema. No pallor.  Psychiatric: She has a normal mood and affect. Her behavior is normal. Judgment and thought content normal.    Data Reviewed I reviewed her recent mammograms and all of my old records.  Assessment    History right nipple discharge, single episode, without recurrence. Considering the imaging findings and the history of a negative biopsy I think this is a low risk finding.  Ductal carcinoma in situ left breast, high-grade, receptor positive. No evidence of recurrence one year following left partial mastectomy, adjuvant radiation therapy and ongoing adjuvant tomorrow.  Hypertension  History of carotid artery blockage    Plan    Bilateral mammograms are scheduled for September 2013.  Keep her appointments with Dr. Cyndie Chime  See me in October 2013 for  long-term followup for surveillance.       Angelia Mould. Derrell Lolling, M.D., [facility] General and Minimally invasive Surgery Breast and Colorectal Surgery Office:   [phone] Pager:   [phone]  12/04/2011, 11:13 AM

## 2011-12-26 ENCOUNTER — Other Ambulatory Visit (HOSPITAL_BASED_OUTPATIENT_CLINIC_OR_DEPARTMENT_OTHER): Payer: Medicare Other | Admitting: Lab

## 2011-12-26 DIAGNOSIS — D0590 Unspecified type of carcinoma in situ of unspecified breast: Secondary | ICD-10-CM

## 2011-12-26 DIAGNOSIS — C50119 Malignant neoplasm of central portion of unspecified female breast: Secondary | ICD-10-CM

## 2011-12-26 DIAGNOSIS — Z170 Estrogen receptor positive status [ER+]: Secondary | ICD-10-CM

## 2011-12-26 LAB — COMPREHENSIVE METABOLIC PANEL
Albumin: 3.8 g/dL (ref 3.5–5.2)
BUN: 14 mg/dL (ref 6–23)
Calcium: 9.5 mg/dL (ref 8.4–10.5)
Chloride: 102 mEq/L (ref 96–112)
Glucose, Bld: 89 mg/dL (ref 70–99)
Potassium: 4.4 mEq/L (ref 3.5–5.3)
Total Protein: 6 g/dL (ref 6.0–8.3)

## 2011-12-26 LAB — CBC WITH DIFFERENTIAL/PLATELET
Basophils Absolute: 0 10*3/uL (ref 0.0–0.1)
Eosinophils Absolute: 0.1 10*3/uL (ref 0.0–0.5)
HGB: 12.6 g/dL (ref 11.6–15.9)
MONO#: 0.6 10*3/uL (ref 0.1–0.9)
NEUT#: 3.6 10*3/uL (ref 1.5–6.5)
RDW: 12.6 % (ref 11.2–14.5)
WBC: 4.9 10*3/uL (ref 3.9–10.3)
lymph#: 0.7 10*3/uL — ABNORMAL LOW (ref 0.9–3.3)

## 2012-01-02 ENCOUNTER — Other Ambulatory Visit: Payer: Self-pay

## 2012-01-02 ENCOUNTER — Ambulatory Visit (HOSPITAL_BASED_OUTPATIENT_CLINIC_OR_DEPARTMENT_OTHER): Payer: Medicare Other | Admitting: Oncology

## 2012-01-02 ENCOUNTER — Encounter: Payer: Self-pay | Admitting: Oncology

## 2012-01-02 VITALS — BP 135/78 | HR 104 | Temp 97.7°F | Ht 67.0 in | Wt 144.8 lb

## 2012-01-02 DIAGNOSIS — D0510 Intraductal carcinoma in situ of unspecified breast: Secondary | ICD-10-CM

## 2012-01-02 DIAGNOSIS — C50119 Malignant neoplasm of central portion of unspecified female breast: Secondary | ICD-10-CM

## 2012-01-02 DIAGNOSIS — M899 Disorder of bone, unspecified: Secondary | ICD-10-CM

## 2012-01-02 DIAGNOSIS — I10 Essential (primary) hypertension: Secondary | ICD-10-CM

## 2012-01-02 DIAGNOSIS — E785 Hyperlipidemia, unspecified: Secondary | ICD-10-CM

## 2012-01-02 DIAGNOSIS — D0590 Unspecified type of carcinoma in situ of unspecified breast: Secondary | ICD-10-CM

## 2012-01-02 HISTORY — DX: Hyperlipidemia, unspecified: E78.5

## 2012-01-02 HISTORY — DX: Essential (primary) hypertension: I10

## 2012-01-02 MED ORDER — LETROZOLE 2.5 MG PO TABS: 2.5000 mg | ORAL_TABLET | Freq: Every day | ORAL | Status: DC

## 2012-01-02 NOTE — Progress Notes (Signed)
Hematology and Oncology Follow Up Visit  Brandi Hanson [id number] 1936-06-30 76 y.o. 01/02/2012 12:48 PM   Principle Diagnosis: Encounter Diagnoses  Name Primary?  Marland Kitchen DCIS (ductal carcinoma in situ) of breast Yes  . Benign essential HTN   . Hyperlipidemia      Interim History:   Followup visit for this 76 year old woman diagnosed with a 1.5 cm poorly differentiated strongly ER positive PR negative DCIS of the left breast in February of 2012. She underwent a left partial mastectomy on 10/18/2010. She then received breast radiation between March 21 and May 4. She was then started on hormonal therapy with Femara which she continues at this time. In September 2012 she developed a bloody discharge from the right nipple. She underwent surgical evaluation. She was found to have a 4 mm mass adjacent to the nipple which appeared to be within the duct. Core needle biopsy on September 18 showed benign adipose tissue. No other gross abnormalities on mammogram. No nipple discharge since that event. She had a short interim followup mammogram done at [facility] in March and no new abnormalities were reported. A copy of the report has not been scanned into the [facility] system yet.  She has had no interim medical problems. She admits to being quite anxious. She was prescribed some Ativan by her primary care physician but doesn't feel that it is really helping. She is already on Lexapro. She has a lot of nonspecific aches and pains primarily enlarged joints hips knees and sometimes her neck. She denies any headache or change in vision. No vaginal bleeding or discharge. Occasional loose bowel movements but no change in her regular bowel habit.   Medications: reviewed  Allergies: No Known Allergies  Review of Systems: Constitutional:   No constitutional symptoms Respiratory: No cough or dyspnea Cardiovascular: No chest pain or palpitations   Gastrointestinal: See above Genito-Urinary: See  above Musculoskeletal: See above Neurologic: See above Skin: Remaining ROS negative.  Physical Exam: Blood pressure 135/78, pulse 104, temperature 97.7 F (36.5 C), temperature source Oral, height 5\' 7"  (1.702 m), weight 144 lb 12.8 oz (65.681 kg). Wt Readings from Last 3 Encounters:  01/02/12 144 lb 12.8 oz (65.681 kg)  12/04/11 143 lb 9.6 oz (65.137 kg)  06/05/11 145 lb (65.772 kg)     General appearance: Thin Caucasian woman HENNT: Pharynx no erythema or exudate Lymph nodes: No cervical supraclavicular or axillary adenopathy Breasts: Surgical changes left breast no dominant mass in either breast Lungs: Clear to auscultation resonant to percussion Heart: Regular rhythm no murmur Abdomen: Soft nontender no mass no organomegaly Extremities: No edema no calf tenderness Vascular: No cyanosis Neurologic: Motor strength 5 over 5 reflexes 1+ symmetric Skin: No rash or ecchymosis  Lab Results: Lab Results  Component Value Date   WBC 4.9 12/26/2011   HGB 12.6 12/26/2011   HCT 37.8 12/26/2011   MCV 86.5 12/26/2011   PLT 178 12/26/2011     Chemistry      Component Value Date/Time   NA 138 12/26/2011 1052   K 4.4 12/26/2011 1052   CL 102 12/26/2011 1052   CO2 29 12/26/2011 1052   BUN 14 12/26/2011 1052   CREATININE 0.66 12/26/2011 1052      Component Value Date/Time   CALCIUM 9.5 12/26/2011 1052   ALKPHOS 69 12/26/2011 1052   AST 21 12/26/2011 1052   ALT 16 12/26/2011 1052   BILITOT 0.5 12/26/2011 1052       Radiological Studies: See discussion above  Impression & Plan: #1. Poorly differentiated DCIS left breast treated as outlined above. She remains free of any obvious new involvement and now out over one year from diagnosis in February 2012. Plan: Continue Femara  #2. Right nipple discharge. See discussion above. Problem resolved. She had a 6 month interval mammogram which was a stable. She will now go back on annual followup.  #3. History of a right T10-T12 dermatome herpes  zoster infection September 2012  #4. Essential hypertension  #5. Osteopenia   CC:. Dr. Lorie Phenix; Dr. Claud Kelp; Dr. Lurline Hare   Levert Feinstein, MD 4/23/201312:48 PM

## 2012-01-04 ENCOUNTER — Other Ambulatory Visit: Payer: Self-pay | Admitting: Oncology

## 2012-01-05 ENCOUNTER — Telehealth: Payer: Self-pay | Admitting: Oncology

## 2012-01-05 ENCOUNTER — Telehealth: Payer: Self-pay | Admitting: *Deleted

## 2012-01-05 NOTE — Telephone Encounter (Signed)
Per staff message from [place], patient appts scheduled. Patient on waiting list for April 2014. Rose aware. [name]

## 2012-01-05 NOTE — Telephone Encounter (Signed)
Talked to pt , gave her appt for May 2013 Zometa ,emailed Marcelino Duster for next Zometa in November 2013

## 2012-01-22 ENCOUNTER — Ambulatory Visit (HOSPITAL_BASED_OUTPATIENT_CLINIC_OR_DEPARTMENT_OTHER): Payer: Medicare Other

## 2012-01-22 ENCOUNTER — Other Ambulatory Visit: Payer: Self-pay | Admitting: Oncology

## 2012-01-22 VITALS — BP 126/71 | HR 80 | Temp 97.5°F

## 2012-01-22 DIAGNOSIS — M949 Disorder of cartilage, unspecified: Secondary | ICD-10-CM

## 2012-01-22 DIAGNOSIS — D0590 Unspecified type of carcinoma in situ of unspecified breast: Secondary | ICD-10-CM

## 2012-01-22 DIAGNOSIS — M899 Disorder of bone, unspecified: Secondary | ICD-10-CM

## 2012-01-22 MED ORDER — ZOLEDRONIC ACID 4 MG/100ML IV SOLN
4.0000 mg | Freq: Once | INTRAVENOUS | Status: AC
  Administered 2012-01-22: 4 mg via INTRAVENOUS
  Filled 2012-01-22: qty 100

## 2012-01-22 MED ORDER — SODIUM CHLORIDE 0.9 % IV SOLN
Freq: Once | INTRAVENOUS | Status: AC
  Administered 2012-01-22: 14:00:00 via INTRAVENOUS

## 2012-02-19 ENCOUNTER — Encounter: Payer: Self-pay | Admitting: Neurology

## 2012-04-09 ENCOUNTER — Encounter: Payer: Self-pay | Admitting: Neurology

## 2012-04-09 ENCOUNTER — Other Ambulatory Visit: Payer: Self-pay | Admitting: Neurology

## 2012-04-09 ENCOUNTER — Ambulatory Visit (INDEPENDENT_AMBULATORY_CARE_PROVIDER_SITE_OTHER): Payer: Medicare Other | Admitting: Neurology

## 2012-04-09 ENCOUNTER — Other Ambulatory Visit (INDEPENDENT_AMBULATORY_CARE_PROVIDER_SITE_OTHER): Payer: Medicare Other

## 2012-04-09 VITALS — BP 152/82 | HR 108 | Ht 67.0 in | Wt 145.0 lb

## 2012-04-09 DIAGNOSIS — R7309 Other abnormal glucose: Secondary | ICD-10-CM

## 2012-04-09 DIAGNOSIS — G609 Hereditary and idiopathic neuropathy, unspecified: Secondary | ICD-10-CM

## 2012-04-09 LAB — CBC WITH DIFFERENTIAL/PLATELET
Basophils Absolute: 0 10*3/uL (ref 0.0–0.1)
Eosinophils Relative: 0.6 % (ref 0.0–5.0)
HCT: 36.1 % (ref 36.0–46.0)
Hemoglobin: 12.2 g/dL (ref 12.0–15.0)
Lymphocytes Relative: 12.1 % (ref 12.0–46.0)
Lymphs Abs: 0.6 10*3/uL — ABNORMAL LOW (ref 0.7–4.0)
Monocytes Relative: 10.7 % (ref 3.0–12.0)
Platelets: 160 10*3/uL (ref 150.0–400.0)
RDW: 12.7 % (ref 11.5–14.6)
WBC: 4.7 10*3/uL (ref 4.5–10.5)

## 2012-04-09 LAB — COMPREHENSIVE METABOLIC PANEL
ALT: 20 U/L (ref 0–35)
Albumin: 4 g/dL (ref 3.5–5.2)
CO2: 31 mEq/L (ref 19–32)
Calcium: 9.4 mg/dL (ref 8.4–10.5)
Chloride: 99 mEq/L (ref 96–112)
GFR: 91.4 mL/min (ref >60.00)
Glucose, Bld: 121 mg/dL — ABNORMAL HIGH (ref 70–99)
Sodium: 137 mEq/L (ref 135–145)
Total Bilirubin: 0.4 mg/dL (ref 0.3–1.2)
Total Protein: 6.6 g/dL (ref 6.0–8.3)

## 2012-04-09 LAB — C-REACTIVE PROTEIN: CRP: <1 mg/dL (ref 1–20)

## 2012-04-09 LAB — SEDIMENTATION RATE: Sed Rate: 21 mm/hr (ref 0–22)

## 2012-04-09 NOTE — Patient Instructions (Signed)
Go to the basement to have your labs drawn today.  . 

## 2012-04-09 NOTE — Progress Notes (Signed)
Dear Dr. Elease Hashimoto,  Thank you for having me see Nathanial Millman in consultation today at [facility] for her problem with memory.  As you may recall, she is a 76 y.o. year old female with a history of anxiety, breast cancer s/p lumpectomy and radiation, depression who has had what she thinks to be worsening memory problems since her diagnosis of breast cancer in late 2011.  She says that she has a hard time with language, sometimes having a hard time coming up with a word, or saying the wrong word.  She sometimes feels confused when she drives, but has not gotten lost.  She doesn't repeat stories.  She denies hallucinations or changes in gait.  She has had worsening anxiety over the same time, with worrying about her health.  She also complains of headaches, with short pain in both small and large areas of her head that last seconds to minutes.  They are not disruptive but they make her worried something else is going on.  She has been dreading this appointment because she is worried I will tell her she has dementia.  Because of her anxiety you have tried her on Lexapro to what sounds like a dose of 20mg  per day.  She has seen no benefit.  She does say that she gets depressed at times as well.  She is very active in her church and is socially engaged.  She lives alone, pays her own bills, and drives without issue.  She prepares food for her self.  Past Medical History  Diagnosis Date  . Hypertension   . PVC (premature ventricular contraction)   . Hyperlipidemia   . Arthritis   . Sleep apnea   . GERD (gastroesophageal reflux disease)   . Cancer     skin  . Anxiety   . Osteoporosis   . Abdominal pain   . Abdominal distention   . Palpitations   . Generalized headaches   . Confusion   . Rectal pain   . Breast cancer in situ 07/22/2011  . Benign essential HTN 01/02/2012  . Hyperlipidemia 01/02/2012  - no history of head trauma, ischemic stroke or seizures  Past Surgical History    Procedure Date  . Abdominal hysterectomy 1978  . Breast biopsy 1990, 1994    left  . Foot surgery 2005  . Rotator cuff repair 2007  . Cataract extraction 2011    bilateral  . Kidney stone surgery 2011    History   Social History  . Marital Status: Single    Spouse Name: N/A    Number of Children: N/A  . Years of Education: N/A   Social History Main Topics  . Smoking status: Never Smoker   . Smokeless tobacco: Never Used  . Alcohol Use: No  . Drug Use: No  . Sexually Active: None   Other Topics Concern  . None   Social History Narrative  . None    Family History  Problem Relation Age of Onset  . Cancer Mother     breast  . Cancer Father     lung  . Cancer Sister     breast and uterine  - no history of memory problems.  Current Outpatient Prescriptions on File Prior to Visit  Medication Sig Dispense Refill  . acetaminophen (TYLENOL) 500 MG tablet Take 500 mg by mouth every 6 (six) hours as needed.        Marland Kitchen aspirin 81 MG tablet Take 81 mg by  mouth daily.        Marland Kitchen CALCIUM CITRATE-VITAMIN D PO Take by mouth daily.        . Cholecalciferol (VITAMIN D3) 1000 UNITS CAPS Take by mouth daily.        . clidinium-chlordiazePOXIDE (LIBRAX) 2.5-5 MG per capsule Take 1 capsule by mouth 2 (two) times daily.        . Coenzyme Q10 (COQ10) 100 MG CAPS Take by mouth daily.        Marland Kitchen escitalopram (LEXAPRO) 10 MG tablet Take 5 mg by mouth 2 (two) times daily.      Marland Kitchen letrozole (FEMARA) 2.5 MG tablet Take 1 tablet (2.5 mg total) by mouth daily.  90 tablet  3  . lisinopril (PRINIVIL,ZESTRIL) 10 MG tablet Take 10 mg by mouth daily.        . Multiple Vitamin (STRESS B) TABS Take by mouth daily.        . nitrofurantoin (MACRODANTIN) 100 MG capsule Take 100 mg by mouth at bedtime.       Marland Kitchen omeprazole (PRILOSEC) 20 MG capsule Take 20 mg by mouth daily.        . potassium citrate (UROCIT-K) 10 MEQ (1080 MG) SR tablet Take 10 mEq by mouth 3 (three) times daily with meals.       .  simvastatin (ZOCOR) 20 MG tablet Take 20 mg by mouth every other day.          No Known Allergies    ROS:  13 systems were reviewed and are notable for no changes in vision.  All other review of systems are unremarkable.   Examination:  Filed Vitals:   04/09/12 1321  BP: 152/82  Pulse: 108  Height: 5\' 7"  (1.702 m)  Weight: 145 lb (65.772 kg)     In general, well appearing older women.  Cardiovascular: The patient has a regular rate and rhythm and no carotid bruits.  Fundoscopy:  Disks are flat. Vessel caliber within normal limits.  Mental status:   MMSE 30/30!  Perfect clock drawing.  Cranial Nerves: Pupils are equally round and reactive to light. Visual fields full to confrontation. Extraocular movements are intact without nystagmus. Facial sensation and muscles of mastication are intact. Muscles of facial expression are symmetric. Hearing intact to bilateral finger rub. Tongue protrusion, uvula, palate midline.  Shoulder shrug intact  Motor:  The patient has normal bulk and tone, no pronator drift.  There are no adventitious movements.  5/5 muscle strength bilaterally.  Reflexes:  1+ reflexes  Toes down  Coordination:  Normal finger to nose.  No dysdiadokinesia.  Sensation is decreased in a length dep fashion to temp and vibration, vib absent at toes.  Gait and Station are normal.  Tandem gait is intact.  Romberg is negative   Impression/Recs: 1.  Memory issues and language issues - While it is possible she has some minor cognitive impairment from a neurodegenerative process, I think given her excellent MMSE exam it is more likely that her problems are mainly due to her anxiety.  I agree with your excellent idea of sending her to psychotherapy.  Also, another medication other than Lexapro may be warranted.  I really don't see a need for imaging at this point given her normal neurologic exam.  I will check a B12 and TSH today but I expect these to be normal. 2.   Short head pains - Uncertain cause, but they are not disabling, and it seems the patient is just more worried  about what they may signify.  Given they move and are transient I do no think they are at all related to a dangerous intracranial process.  If they continue to bother her you could consider a low dose of Pamelor 10mg  to increase to 20 or 30mg  to see if this helps.  I don't think they represent TA but I will get an ESR today. 3.  Subclinical peripheral neuropathy - not symptomatic other than some mild gait unsteadiness.  I will check PN labs.  As I will be moving to [facility] in September she can follow up with you.   We will let her know the results of her tests.   Thank you for having [place] see Nathanial Millman in consultation.  Feel free to contact me with any questions.  Lupita Raider Modesto Charon, MD [facility],  [address] [place], [place] [address] Phone: [phone] Fax: [phone].

## 2012-04-11 LAB — SPEP & IFE WITH QIG
Albumin ELP: 64.2 % (ref 55.8–66.1)
Beta 2: 4.3 % (ref 3.2–6.5)
Beta Globulin: 5.4 % (ref 4.7–7.2)
IgA: 163 mg/dL (ref 69–380)
IgG (Immunoglobin G), Serum: 785 mg/dL (ref 690–1700)
IgM, Serum: 75 mg/dL (ref 52–322)
Total Protein, Serum Electrophoresis: 6.4 g/dL (ref 6.0–8.3)

## 2012-04-22 ENCOUNTER — Telehealth: Payer: Self-pay | Admitting: Neurology

## 2012-04-22 NOTE — Telephone Encounter (Signed)
Message copied by Benay Spice on Mon Apr 22, 2012  9:58 AM ------      Message from: Milas Gain      Created: Mon Apr 22, 2012  6:32 AM       Let [facility]. [facility] know her labs looked ok.

## 2012-04-22 NOTE — Telephone Encounter (Signed)
Left the patient a voice message on her cell phone stating her recent lab work was normal and to call if questions.

## 2012-05-15 ENCOUNTER — Telehealth: Payer: Self-pay | Admitting: Neurology

## 2012-05-15 NOTE — Telephone Encounter (Signed)
Pt states that PCP never received copies of blood work results or any results from other tests she's had done by Dr. Modesto Charon. Can we send those to Dr. Lorie Phenix at [facility]?

## 2012-05-15 NOTE — Telephone Encounter (Signed)
Left a message for Harriett Sine at her home number that lab work and office note faxed as requested.

## 2012-05-21 ENCOUNTER — Encounter (INDEPENDENT_AMBULATORY_CARE_PROVIDER_SITE_OTHER): Payer: Self-pay | Admitting: General Surgery

## 2012-05-21 ENCOUNTER — Ambulatory Visit (INDEPENDENT_AMBULATORY_CARE_PROVIDER_SITE_OTHER): Payer: Medicare Other | Admitting: General Surgery

## 2012-05-21 VITALS — BP 146/84 | HR 90 | Temp 97.9°F | Resp 18 | Ht 67.0 in | Wt 144.6 lb

## 2012-05-21 DIAGNOSIS — D0590 Unspecified type of carcinoma in situ of unspecified breast: Secondary | ICD-10-CM

## 2012-05-21 NOTE — Patient Instructions (Signed)
Your breast exam today is normal. There is no evidence of cancer on either side.  Be sure to get your mammograms next week and yearly in September.  Continue takiing the Femara medicine, and keep you regular appointment with Dr. Cyndie Chime  Return to see Dr. Derrell Lolling in 13 months, after next years mammograms.

## 2012-05-21 NOTE — Progress Notes (Signed)
Patient ID: Brandi Hanson, female   DOB: [birth year], 76 y.o.   MRN: [id number]  Chief Complaint  Patient presents with  . Routine Post Op    HPI Brandi Hanson is a 76 y.o. female.  She returns for long-term followup regarding her left breast cancer.  On 10/19/2011 this patient underwent left partial mastectomy and reexcision of medial margins. 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 She recovered and underwent adjuvant radiation therapy. She is now on adjuvant Femara and is followed by Dr. Nicolasa Ducking every 6 months or 7.  She was evaluated for a right nipple discharge in September 2012 which was biopsied and was benign and nothing further needed to be done. The right nipple discharge has stopped.  She has no significant complaints about her breast. Next mammograms are scheduled make a week next week, September 18. HPI  Past Medical History  Diagnosis Date  . Hypertension   . PVC (premature ventricular contraction)   . Hyperlipidemia   . Arthritis   . Sleep apnea   . GERD (gastroesophageal reflux disease)   . Cancer     skin  . Anxiety   . Osteoporosis   . Abdominal pain   . Abdominal distention   . Palpitations   . Generalized headaches   . Confusion   . Rectal pain   . Breast cancer in situ 07/22/2011  . Benign essential HTN 01/02/2012  . Hyperlipidemia 01/02/2012    Past Surgical History  Procedure Date  . Abdominal hysterectomy 1978  . Breast biopsy 1990, 1994    left  . Foot surgery 2005  . Rotator cuff repair 2007  . Cataract extraction 2011    bilateral  . Kidney stone surgery 2011    Family History  Problem Relation Age of Onset  . Cancer Mother     breast  . Cancer Father     lung  . Cancer Sister     breast and uterine    Social History History  Substance Use Topics  . Smoking status: Never Smoker   . Smokeless tobacco: Never Used  . Alcohol Use: No    No Known Allergies  Current Outpatient  Prescriptions  Medication Sig Dispense Refill  . acetaminophen (TYLENOL) 500 MG tablet Take 500 mg by mouth every 6 (six) hours as needed.        Marland Kitchen aspirin 81 MG tablet Take 81 mg by mouth daily.        Marland Kitchen CALCIUM CITRATE-VITAMIN D PO Take by mouth daily.        . Cholecalciferol (VITAMIN D3) 1000 UNITS CAPS Take by mouth daily.        . clidinium-chlordiazePOXIDE (LIBRAX) 2.5-5 MG per capsule Take 1 capsule by mouth 2 (two) times daily.        . Coenzyme Q10 (COQ10) 100 MG CAPS Take by mouth daily.        Marland Kitchen FLUoxetine (PROZAC) 20 MG capsule Take 20 mg by mouth daily.      Marland Kitchen letrozole (FEMARA) 2.5 MG tablet Take 1 tablet (2.5 mg total) by mouth daily.  90 tablet  3  . lisinopril (PRINIVIL,ZESTRIL) 10 MG tablet Take 10 mg by mouth daily.        . Multiple Vitamin (STRESS B) TABS Take by mouth daily.        . nitrofurantoin (MACRODANTIN) 100 MG capsule Take 100 mg by mouth at bedtime.       Marland Kitchen omeprazole (PRILOSEC) 20  MG capsule Take 20 mg by mouth every other day.       . potassium citrate (UROCIT-K) 10 MEQ (1080 MG) SR tablet Take 10 mEq by mouth 2 (two) times daily.       . simvastatin (ZOCOR) 10 MG tablet Take 10 mg by mouth every other day.        Review of Systems Review of Systems  Constitutional: Negative for fever, chills and unexpected weight change.  HENT: Negative for hearing loss, congestion, sore throat, trouble swallowing and voice change.   Eyes: Negative for visual disturbance.  Respiratory: Negative for cough and wheezing.   Cardiovascular: Negative for chest pain, palpitations and leg swelling.  Gastrointestinal: Negative for nausea, vomiting, abdominal pain, diarrhea, constipation, blood in stool, abdominal distention and anal bleeding.  Genitourinary: Negative for hematuria, vaginal bleeding and difficulty urinating.  Musculoskeletal: Negative for arthralgias.  Skin: Negative for rash and wound.  Neurological: Negative for seizures, syncope and headaches.    Hematological: Negative for adenopathy. Does not bruise/bleed easily.  Psychiatric/Behavioral: Negative for confusion.    Blood pressure 146/84, pulse 90, temperature 97.9 F (36.6 C), resp. rate 18, height 5\' 7"  (1.702 m), weight 144 lb 9.6 oz (65.59 kg).  Physical Exam Physical Exam  Constitutional: She is oriented to person, place, and time. She appears well-developed and well-nourished. No distress.  HENT:  Head: Normocephalic and atraumatic.  Eyes: Conjunctivae and EOM are normal. Pupils are equal, round, and reactive to light. Left eye exhibits no discharge. No scleral icterus.  Neck: Neck supple. No JVD present. No tracheal deviation present. No thyromegaly present.  Cardiovascular: Normal rate, regular rhythm, normal heart sounds and intact distal pulses.   No murmur heard. Pulmonary/Chest: Effort normal and breath sounds normal. No respiratory distress. She has no wheezes. She has no rales. She exhibits no tenderness.       Transverse circumareolar scar at 12:00 position left breast well healed. No other skin changes bilaterally. No breast mass bilaterally. No axillary adenopathy. No nipple discharge.  Musculoskeletal: She exhibits no edema and no tenderness.  Lymphadenopathy:    She has no cervical adenopathy.  Neurological: She is alert and oriented to person, place, and time. She exhibits normal muscle tone. Coordination normal.  Skin: Skin is warm. No rash noted. She is not diaphoretic. No erythema. No pallor.  Psychiatric: She has a normal mood and affect. Her behavior is normal. Judgment and thought content normal.    Data Reviewed Cancer center notes. [facility] notes.  Assessment    High-grade DCIS left breast, 1.5 cm, receptor positive.  No evidence of recurrence 6 months following left partial mastectomy and adjuvant radiation therapy    Plan    Continue Femara and regular followup with Dr. Cyndie Chime.  Bilateral mammogram 05/29/2012, and then annually in  September  Return to see me in 13 months, after her next mammograms.       Angelia Mould. Derrell Lolling, M.D., [facility] General and Minimally invasive Surgery Breast and Colorectal Surgery Office:   [phone] Pager:   [phone]  05/21/2012, 10:05 AM

## 2012-06-10 ENCOUNTER — Other Ambulatory Visit: Payer: Self-pay | Admitting: Radiology

## 2012-06-14 ENCOUNTER — Telehealth: Payer: Self-pay | Admitting: *Deleted

## 2012-06-14 NOTE — Telephone Encounter (Signed)
Called patient and let her know that breast bx was benign.  She very much appreciated the call.

## 2012-06-14 NOTE — Telephone Encounter (Signed)
Message copied by Orbie Hurst on Fri Jun 14, 2012  2:03 PM ------      Message from: Levert Feinstein      Created: Wed Jun 12, 2012  2:15 PM       Call pt  Benign findings on breast biopsy!

## 2012-06-20 ENCOUNTER — Other Ambulatory Visit: Payer: Self-pay | Admitting: *Deleted

## 2012-06-20 ENCOUNTER — Telehealth: Payer: Self-pay | Admitting: *Deleted

## 2012-06-20 NOTE — Telephone Encounter (Signed)
Per patient, she is not to receive zometa but once a year. I told her that we will call her back regardign appt. Desk RN notified. [name]

## 2012-06-20 NOTE — Telephone Encounter (Signed)
Received call from pt stating that she received automated call regarding an appt for tomorrow for something & she called back & someone told her it was for zometa.  She states that she doesn't think she is to receive this & wants to clarify with Dr Cyndie Chime. Message will be left for him to review.  Orders seen for zometa in November 2013.  Cancelled appt with Marcelino Duster in infusion.

## 2012-06-24 ENCOUNTER — Ambulatory Visit: Payer: Medicare Other

## 2012-06-30 ENCOUNTER — Other Ambulatory Visit: Payer: Self-pay | Admitting: Oncology

## 2012-07-02 ENCOUNTER — Other Ambulatory Visit: Payer: Self-pay | Admitting: *Deleted

## 2012-07-03 ENCOUNTER — Telehealth: Payer: Self-pay | Admitting: *Deleted

## 2012-07-03 NOTE — Telephone Encounter (Signed)
Per voice mail from desk RN, patient wants to move her apapts from 11/13 to 11/11. I have called and left her a message to call me back.  [name]

## 2012-07-03 NOTE — Telephone Encounter (Signed)
Patient called back and I have moved her appts from 11/13 to 11/11.  [name]

## 2012-07-04 ENCOUNTER — Other Ambulatory Visit: Payer: Self-pay | Admitting: *Deleted

## 2012-07-04 DIAGNOSIS — D0590 Unspecified type of carcinoma in situ of unspecified breast: Secondary | ICD-10-CM

## 2012-07-22 ENCOUNTER — Other Ambulatory Visit: Payer: Medicare Other | Admitting: Lab

## 2012-07-22 ENCOUNTER — Ambulatory Visit (HOSPITAL_BASED_OUTPATIENT_CLINIC_OR_DEPARTMENT_OTHER): Payer: Medicare Other

## 2012-07-22 VITALS — BP 130/79 | HR 89 | Temp 97.9°F

## 2012-07-22 DIAGNOSIS — D0590 Unspecified type of carcinoma in situ of unspecified breast: Secondary | ICD-10-CM

## 2012-07-22 DIAGNOSIS — M949 Disorder of cartilage, unspecified: Secondary | ICD-10-CM

## 2012-07-22 MED ORDER — SODIUM CHLORIDE 0.9 % IV SOLN
Freq: Once | INTRAVENOUS | Status: AC
  Administered 2012-07-22: 10:00:00 via INTRAVENOUS

## 2012-07-22 MED ORDER — ZOLEDRONIC ACID 4 MG/5ML IV CONC
4.0000 mg | Freq: Once | INTRAVENOUS | Status: AC
  Administered 2012-07-22: 4 mg via INTRAVENOUS
  Filled 2012-07-22: qty 5

## 2012-07-22 NOTE — Patient Instructions (Addendum)
[facility] Discharge Instructions for Patients Receiving Chemotherapy  Today you received the following Zometa  To help prevent nausea and vomiting after your treatment, we encourage you to take your nausea medication as prescribed.   If you develop nausea and vomiting that is not controlled by your nausea medication, call the clinic. If it is after clinic hours your family physician or the after hours number for the clinic or go to the Emergency Department.   BELOW ARE SYMPTOMS THAT SHOULD BE REPORTED IMMEDIATELY:  *FEVER GREATER THAN 100.5 F  *CHILLS WITH OR WITHOUT FEVER  NAUSEA AND VOMITING THAT IS NOT CONTROLLED WITH YOUR NAUSEA MEDICATION  *UNUSUAL SHORTNESS OF BREATH  *UNUSUAL BRUISING OR BLEEDING  TENDERNESS IN MOUTH AND THROAT WITH OR WITHOUT PRESENCE OF ULCERS  *URINARY PROBLEMS  *BOWEL PROBLEMS  UNUSUAL RASH Items with * indicate a potential emergency and should be followed up as soon as possible.  One of the nurses will contact you 24 hours after your treatment. Please let the nurse know about any problems that you may have experienced. Feel free to call the clinic you have any questions or concerns. The clinic phone number is [phone].   I have been informed and understand all the instructions given to me. I know to contact the clinic, my physician, or go to the Emergency Department if any problems should occur. I do not have any questions at this time, but understand that I may call the clinic during office hours   should I have any questions or need assistance in obtaining follow up care.    __________________________________________  _____________  __________ Signature of Patient or Authorized Representative            Date                   Time    __________________________________________ Nurse's Signature

## 2012-07-24 ENCOUNTER — Ambulatory Visit: Payer: Medicare Other

## 2012-07-24 ENCOUNTER — Other Ambulatory Visit: Payer: Medicare Other | Admitting: Lab

## 2012-08-09 ENCOUNTER — Ambulatory Visit: Payer: Medicare Other

## 2012-08-26 ENCOUNTER — Encounter (INDEPENDENT_AMBULATORY_CARE_PROVIDER_SITE_OTHER): Payer: Self-pay

## 2012-11-11 ENCOUNTER — Telehealth: Payer: Self-pay | Admitting: Oncology

## 2012-11-11 NOTE — Telephone Encounter (Signed)
pt called back to confirm appts.Marland Kitchentd

## 2012-11-11 NOTE — Telephone Encounter (Signed)
left message..gv appt d/t..made pt aware that i will mail a letter/cal...td

## 2012-12-06 ENCOUNTER — Telehealth: Payer: Self-pay | Admitting: *Deleted

## 2012-12-06 NOTE — Telephone Encounter (Signed)
Pt notified of stable results of bone density.  Dr. Cyndie Chime wanted her PCP to have a copy & pt reports that she already has seen report & had called her.

## 2012-12-31 ENCOUNTER — Other Ambulatory Visit (HOSPITAL_BASED_OUTPATIENT_CLINIC_OR_DEPARTMENT_OTHER): Payer: Medicare Other | Admitting: Lab

## 2012-12-31 DIAGNOSIS — D0590 Unspecified type of carcinoma in situ of unspecified breast: Secondary | ICD-10-CM

## 2012-12-31 DIAGNOSIS — I10 Essential (primary) hypertension: Secondary | ICD-10-CM

## 2012-12-31 DIAGNOSIS — D0510 Intraductal carcinoma in situ of unspecified breast: Secondary | ICD-10-CM

## 2012-12-31 LAB — COMPREHENSIVE METABOLIC PANEL (CC13)
CO2: 28 mEq/L (ref 22–29)
Creatinine: 0.7 mg/dL (ref 0.6–1.1)
Glucose: 101 mg/dl — ABNORMAL HIGH (ref 70–99)
Total Bilirubin: 0.42 mg/dL (ref 0.20–1.20)
Total Protein: 6.4 g/dL (ref 6.4–8.3)

## 2012-12-31 LAB — CBC WITH DIFFERENTIAL/PLATELET
Eosinophils Absolute: 0.1 10*3/uL (ref 0.0–0.5)
HCT: 35.5 % (ref 34.8–46.6)
LYMPH%: 13.6 % — ABNORMAL LOW (ref 14.0–49.7)
MONO#: 0.7 10*3/uL (ref 0.1–0.9)
NEUT#: 4.6 10*3/uL (ref 1.5–6.5)
NEUT%: 73.5 % (ref 38.4–76.8)
Platelets: 170 10*3/uL (ref 145–400)
WBC: 6.3 10*3/uL (ref 3.9–10.3)

## 2013-01-02 ENCOUNTER — Ambulatory Visit (HOSPITAL_BASED_OUTPATIENT_CLINIC_OR_DEPARTMENT_OTHER): Payer: Medicare Other

## 2013-01-02 VITALS — BP 142/83 | HR 85 | Temp 97.1°F

## 2013-01-02 DIAGNOSIS — M899 Disorder of bone, unspecified: Secondary | ICD-10-CM

## 2013-01-02 DIAGNOSIS — D0590 Unspecified type of carcinoma in situ of unspecified breast: Secondary | ICD-10-CM

## 2013-01-02 MED ORDER — SODIUM CHLORIDE 0.9 % IV SOLN
Freq: Once | INTRAVENOUS | Status: AC
  Administered 2013-01-02: 14:00:00 via INTRAVENOUS

## 2013-01-02 MED ORDER — ZOLEDRONIC ACID 4 MG/100ML IV SOLN
4.0000 mg | Freq: Once | INTRAVENOUS | Status: AC
  Administered 2013-01-02: 4 mg via INTRAVENOUS
  Filled 2013-01-02: qty 100

## 2013-01-02 NOTE — Patient Instructions (Signed)
Zoledronic Acid injection (Paget's Disease, Osteoporosis) What is this medicine? ZOLEDRONIC ACID (ZOE le dron ik AS id) lowers the amount of calcium loss from bone. It is used to treat Paget's disease and osteoporosis in women. This medicine may be used for other purposes; ask your health care provider or pharmacist if you have questions. What should I tell my health care provider before I take this medicine? They need to know if you have any of these conditions: -aspirin-sensitive asthma -dental disease -kidney disease -low levels of calcium in the blood -past surgery on the parathyroid gland or intestines -an unusual or allergic reaction to zoledronic acid, other medicines, foods, dyes, or preservatives -pregnant or trying to get pregnant -breast-feeding How should I use this medicine? This medicine is for infusion into a vein. It is given by a health care professional in a hospital or clinic setting. Talk to your pediatrician regarding the use of this medicine in children. This medicine is not approved for use in children. Overdosage: If you think you have taken too much of this medicine contact a poison control center or emergency room at once. NOTE: This medicine is only for you. Do not share this medicine with others. What if I miss a dose? It is important not to miss your dose. Call your doctor or health care professional if you are unable to keep an appointment. What may interact with this medicine? -certain antibiotics given by injection -NSAIDs, medicines for pain and inflammation, like ibuprofen or naproxen -some diuretics like bumetanide, furosemide -teriparatide This list may not describe all possible interactions. Give your health care provider a list of all the medicines, herbs, non-prescription drugs, or dietary supplements you use. Also tell them if you smoke, drink alcohol, or use illegal drugs. Some items may interact with your medicine. What should I watch for while  using this medicine? Visit your doctor or health care professional for regular checkups. It may be some time before you see the benefit from this medicine. Do not stop taking your medicine unless your doctor tells you to. Your doctor may order blood tests or other tests to see how you are doing. Women should inform their doctor if they wish to become pregnant or think they might be pregnant. There is a potential for serious side effects to an unborn child. Talk to your health care professional or pharmacist for more information. You should make sure that you get enough calcium and vitamin D while you are taking this medicine. Discuss the foods you eat and the vitamins you take with your health care professional. Some people who take this medicine have severe bone, joint, and/or muscle pain. This medicine may also increase your risk for a broken thigh bone. Tell your doctor right away if you have pain in your upper leg or groin. Tell your doctor if you have any pain that does not go away or that gets worse. What side effects may I notice from receiving this medicine? Side effects that you should report to your doctor or health care professional as soon as possible: -allergic reactions like skin rash, itching or hives, swelling of the face, lips, or tongue -breathing problems -changes in vision -feeling faint or lightheaded, falls -jaw burning, cramping, or pain -muscle cramps, stiffness, or weakness -trouble passing urine or change in the amount of urine Side effects that usually do not require medical attention (report to your doctor or health care professional if they continue or are bothersome): -bone, joint, or muscle pain -fever -  irritation at site where injected -loss of appetite -nausea, vomiting -stomach upset -tired This list may not describe all possible side effects. Call your doctor for medical advice about side effects. You may report side effects to FDA at 1-800-FDA-1088. Where  should I keep my medicine? This drug is given in a hospital or clinic and will not be stored at home. NOTE: This sheet is a summary. It may not cover all possible information. If you have questions about this medicine, talk to your doctor, pharmacist, or health care provider.  2013, [employer]. (02/24/2011 9:08:15 AM)  

## 2013-01-07 ENCOUNTER — Ambulatory Visit: Payer: Medicare Other | Admitting: Oncology

## 2013-01-13 ENCOUNTER — Ambulatory Visit (HOSPITAL_BASED_OUTPATIENT_CLINIC_OR_DEPARTMENT_OTHER): Payer: Medicare Other | Admitting: Oncology

## 2013-01-13 VITALS — BP 147/78 | HR 79 | Temp 98.1°F | Resp 17 | Ht 67.0 in | Wt 147.6 lb

## 2013-01-13 DIAGNOSIS — M949 Disorder of cartilage, unspecified: Secondary | ICD-10-CM

## 2013-01-13 DIAGNOSIS — D0592 Unspecified type of carcinoma in situ of left breast: Secondary | ICD-10-CM

## 2013-01-13 DIAGNOSIS — M899 Disorder of bone, unspecified: Secondary | ICD-10-CM

## 2013-01-13 DIAGNOSIS — D0590 Unspecified type of carcinoma in situ of unspecified breast: Secondary | ICD-10-CM

## 2013-01-13 DIAGNOSIS — R0781 Pleurodynia: Secondary | ICD-10-CM

## 2013-01-13 DIAGNOSIS — R079 Chest pain, unspecified: Secondary | ICD-10-CM

## 2013-01-13 DIAGNOSIS — I10 Essential (primary) hypertension: Secondary | ICD-10-CM

## 2013-01-14 ENCOUNTER — Telehealth: Payer: Self-pay | Admitting: Oncology

## 2013-01-14 NOTE — Telephone Encounter (Signed)
Talked to pt and gave her appt for [id number] lab and MD, pt will schedule own mammogram, per pt

## 2013-01-17 ENCOUNTER — Encounter (HOSPITAL_COMMUNITY)
Admission: RE | Admit: 2013-01-17 | Discharge: 2013-01-17 | Disposition: A | Discharge status: Home / Self Care | Payer: Medicare Other | Source: Ambulatory Visit | Attending: Oncology | Admitting: Oncology

## 2013-01-17 ENCOUNTER — Telehealth: Payer: Self-pay | Admitting: *Deleted

## 2013-01-17 ENCOUNTER — Ambulatory Visit (HOSPITAL_COMMUNITY)
Admission: RE | Admit: 2013-01-17 | Discharge: 2013-01-17 | Disposition: A | Discharge status: Home / Self Care | Payer: Medicare Other | Source: Ambulatory Visit | Attending: Oncology | Admitting: Oncology

## 2013-01-17 ENCOUNTER — Other Ambulatory Visit: Payer: Self-pay | Admitting: Oncology

## 2013-01-17 ENCOUNTER — Encounter (HOSPITAL_COMMUNITY): Payer: Self-pay

## 2013-01-17 DIAGNOSIS — M51379 Other intervertebral disc degeneration, lumbosacral region without mention of lumbar back pain or lower extremity pain: Secondary | ICD-10-CM | POA: Insufficient documentation

## 2013-01-17 DIAGNOSIS — D0592 Unspecified type of carcinoma in situ of left breast: Secondary | ICD-10-CM

## 2013-01-17 DIAGNOSIS — Z923 Personal history of irradiation: Secondary | ICD-10-CM | POA: Insufficient documentation

## 2013-01-17 DIAGNOSIS — R0781 Pleurodynia: Secondary | ICD-10-CM

## 2013-01-17 DIAGNOSIS — I10 Essential (primary) hypertension: Secondary | ICD-10-CM | POA: Insufficient documentation

## 2013-01-17 DIAGNOSIS — E785 Hyperlipidemia, unspecified: Secondary | ICD-10-CM | POA: Insufficient documentation

## 2013-01-17 DIAGNOSIS — M4120 Other idiopathic scoliosis, site unspecified: Secondary | ICD-10-CM | POA: Insufficient documentation

## 2013-01-17 DIAGNOSIS — R079 Chest pain, unspecified: Secondary | ICD-10-CM | POA: Insufficient documentation

## 2013-01-17 DIAGNOSIS — M5137 Other intervertebral disc degeneration, lumbosacral region: Secondary | ICD-10-CM | POA: Insufficient documentation

## 2013-01-17 DIAGNOSIS — D0590 Unspecified type of carcinoma in situ of unspecified breast: Secondary | ICD-10-CM | POA: Insufficient documentation

## 2013-01-17 DIAGNOSIS — M47817 Spondylosis without myelopathy or radiculopathy, lumbosacral region: Secondary | ICD-10-CM | POA: Insufficient documentation

## 2013-01-17 MED ORDER — TECHNETIUM TC 99M MEDRONATE IV KIT
25.1000 | PACK | Freq: Once | INTRAVENOUS | Status: AC | PRN
  Administered 2013-01-17: 25.1 via INTRAVENOUS

## 2013-01-17 NOTE — Telephone Encounter (Signed)
Notified pt of normal bone scan except for mild arthritis per Dr Cyndie Chime.  She also asked about rib films & reported that these looked OK also.

## 2013-01-17 NOTE — Telephone Encounter (Signed)
Message copied by Sabino Snipes on Fri Jan 17, 2013  5:46 PM ------      Message from: Levert Feinstein      Created: Fri Jan 17, 2013  5:03 PM       Call pt bone scan normal except for mild arthritis ------

## 2013-01-19 NOTE — Progress Notes (Signed)
Hematology and Oncology Follow Up Visit  Brandi Hanson [id number] 02-03-1936 77 y.o. 01/19/2013 1:35 PM   Principle Diagnosis: Encounter Diagnoses  Name Primary?  . Rib pain on left side   . Breast cancer in situ, left Yes     Interim History:   Followup visit for this 77 year old woman with stage I, ER positive, cancer of the left breast diagnosed in February 2012. 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 She underwent lumpectomy 10/18/2010. She then received breast radiation and was started on hormonal therapy with Femara. I also take care of her sister who has stage I breast cancer in remission.  Overall she is doing well. She has had no interim medical problems. She does report some atypical posterior lower left rib pain which she has had intermittently over the last 2-3 months. No obvious trauma.  Medications: reviewed  Allergies: No Known Allergies  Review of Systems: Constitutional:   No constitutional symptoms Respiratory: No cough or dyspnea Cardiovascular:  No chest pain or palpitations Gastrointestinal: No abdominal pain or change in bowel but Genito-Urinary: No urinary tract symptoms, no vaginal bleeding Musculoskeletal: See above Neurologic: No headache or change in vision Skin: No rash or ecchymosis Remaining ROS negative.  Physical Exam: Blood pressure 147/78, pulse 79, temperature 98.1 F (36.7 C), temperature source Oral, resp. rate 17, height 5\' 7"  (1.702 m), weight 147 lb 9.6 oz (66.951 kg). Wt Readings from Last 3 Encounters:  01/13/13 147 lb 9.6 oz (66.951 kg)  05/21/12 144 lb 9.6 oz (65.59 kg)  04/09/12 145 lb (65.772 kg)     General appearance: Thin Caucasian woman HENNT: Pharynx no erythema or exudate Lymph nodes: No lymphadenopathy Breasts: Surgical changes left breast, no dominant mass in either breast Lungs: Clear to auscultation resonant to percussion Heart: Regular rhythm no murmur Abdomen: Soft, nontender, no mass, no  organomegaly Extremities: No edema, no calf tenderness Musculoskeletal: Discomfort on palpation over left posterior lower ribs. GU: Vascular: No cyanosis Neurologic: Motor strength 5 over 5, reflexes 1+ symmetric Skin: No rash or ecchymosis  Lab Results: Lab Results  Component Value Date   WBC 6.3 12/31/2012   HGB 12.0 12/31/2012   HCT 35.5 12/31/2012   MCV 88.3 12/31/2012   PLT 170 12/31/2012     Chemistry      Component Value Date/Time   NA 140 12/31/2012 1032   NA 137 04/09/2012 1439   K 4.3 12/31/2012 1032   K 4.1 04/09/2012 1439   CL 105 12/31/2012 1032   CL 99 04/09/2012 1439   CO2 28 12/31/2012 1032   CO2 31 04/09/2012 1439   BUN 23.6 12/31/2012 1032   BUN 13 04/09/2012 1439   CREATININE 0.7 12/31/2012 1032   CREATININE 0.7 04/09/2012 1439      Component Value Date/Time   CALCIUM 9.2 12/31/2012 1032   CALCIUM 9.4 04/09/2012 1439   ALKPHOS 80 12/31/2012 1032   ALKPHOS 63 04/09/2012 1439   AST 25 12/31/2012 1032   AST 25 04/09/2012 1439   ALT 31 12/31/2012 1032   ALT 20 04/09/2012 1439   BILITOT 0.42 12/31/2012 1032   BILITOT 0.4 04/09/2012 1439       Radiological Studies: Dg Ribs Unilateral W/chest Left  01/17/2013  *RADIOLOGY REPORT*  Clinical Data: Breast cancer, lower rib pain  LEFT RIBS AND CHEST - 3+ VIEW  Comparison: 10/17/2010, 2-80,012, 01/17/2013  Findings: Chronic biapical pleural scarring.  Normal heart size and vascularity.  No focal pneumonia, collapse, consolidation, effusion, or pneumothorax.  Trachea midline.  No free air.  No displaced rib fracture or focal abnormality.  No subcutaneous emphysema or chest wall asymmetry.  IMPRESSION: Stable chronic findings.  No acute process.   Original Report Authenticated By: Judie Petit. Miles Costain, M.D.    Nm Bone Scan Whole Body  01/17/2013  *RADIOLOGY REPORT*  Clinical Data: Unexplained posterior lower rib pain.  History of breast cancer.  NUCLEAR MEDICINE WHOLE BODY BONE SCINTIGRAPHY  Technique:  Whole body anterior and posterior images were  obtained approximately 3 hours after intravenous injection of radiopharmaceutical.  Radiopharmaceutical: 25.1MILLI CURIE TC-MDP TECHNETIUM TC 83M MEDRONATE IV KIT  Comparison: None.  Findings: There is a normal physiologic tissue fusion of the radiotracer within both kidneys and urinary bladder.  There is a mild curvature of the thoracic and lumbar spine which is convex to the left.  Mild degenerative disc disease is noted within the upper and lower lumbar spine.  There is no abnormal focus of increased uptake to suggest metastatic disease.  No post-traumatic uptake identified.  IMPRESSION:  1.  No evidence for metastases. 2. Mild scoliosis and lumbar spondylosis.   Original Report Authenticated By: Signa Kell, M.D.     Impression: #1. Stage 1, DCIS left breast, treated as outlined above. No gross evidence for recurrence at this time. Plan: She will continue Femara to complete 5 years of treatment through March of 2017.  #2. Musculoskeletal pain left lower ribs. I did go ahead and get rib x-rays today and scheduled a bone scan. These do not show any evidence for metastatic disease.  #3. History of right nipple discharge. No recurrence now out 1-1/2 years.  #4. History of right T10-T12 dermatome herpes zoster infection September 2012  #5. Essential hypertension  #6. Osteopenia     CC:. Dr. Lorie Phenix; Dr. Claud Kelp; Dr. Hal Neer   Levert Feinstein, MD 5/11/20141:35 PM

## 2013-06-06 ENCOUNTER — Encounter (INDEPENDENT_AMBULATORY_CARE_PROVIDER_SITE_OTHER): Payer: Self-pay

## 2013-06-09 ENCOUNTER — Ambulatory Visit (INDEPENDENT_AMBULATORY_CARE_PROVIDER_SITE_OTHER): Payer: Medicare Other | Admitting: Neurology

## 2013-06-09 ENCOUNTER — Encounter: Payer: Self-pay | Admitting: Neurology

## 2013-06-09 VITALS — BP 132/72 | HR 98 | Temp 98.5°F | Ht 67.0 in | Wt 146.0 lb

## 2013-06-09 DIAGNOSIS — G3184 Mild cognitive impairment, so stated: Secondary | ICD-10-CM

## 2013-06-09 DIAGNOSIS — F411 Generalized anxiety disorder: Secondary | ICD-10-CM | POA: Insufficient documentation

## 2013-06-09 DIAGNOSIS — R413 Other amnesia: Secondary | ICD-10-CM

## 2013-06-09 NOTE — Patient Instructions (Addendum)
Start fish oil 2 caps daily and Cerefolin nac one tablet daily. I gave her a prescription and samples of Cerefolin nac to take. Check memory panel labs, EEG, MRI scan and referred to Dr. Leonides Cave for neuropsychological testing to tease out the relative contributions of anxiety versus mild cognitive impairment to her memory symptoms. Return for followup in 4 weeks or call earlier if necessary.

## 2013-06-09 NOTE — Progress Notes (Signed)
[facility] [address] [place]. [place] [address] [phone]       OFFICE CONSULT NOTE  [name] Date of Birth:  1936-03-28 Medical Record Number:  [id number]   Referring MD:  Hendricks Hanson  Reason for Referral:  Memory loss  HPI: 74 year Caucasian lady  having mild progressive memory and cognitive difficulties for last 2 years which have been mildly progressive.She forgets  recent  Events, tasks and at times struggles to find words and finish sentences stopping in midsentence or occcasionally even stuttering..She misplaces things.at times she has been noticed as staring blankly into space and transiently unaware of her surroundings.She has longstanding h/o anxiety and has been on Librax 2.5 mg twice daily  For years and Inderal LA 60 mg has been added recently.She is yet independent in her ADLs and lives alone. There is no h/o headache, seizure, TIA, stroke, significant head injury with loss of consciousness.She has not had any brain imaging or lab work for causes of memory loss or detailed neuropsych testing.  ROS:   14 system review of systems is positive for eye pain,cpough,chest pain,palpititions,hearing loss,ringing in ears,itching,eye pain,easy bruising,confusion,headache,numbness,dizziness,sleepiness,snoring,depression,anxiety,not enough sleep,disinterest in activities, racing thoughts.  PMH:  Past Medical History  Diagnosis Date  . Hypertension   . PVC (premature ventricular contraction)   . Hyperlipidemia   . Arthritis   . Sleep apnea   . GERD (gastroesophageal reflux disease)   . Cancer     skin  . Anxiety   . Osteoporosis   . Abdominal pain   . Abdominal distention   . Palpitations   . Generalized headaches   . Confusion   . Rectal pain   . Breast cancer in situ 07/22/2011  . Benign essential HTN 01/02/2012  . Hyperlipidemia 01/02/2012    Social History:  History   Social History  . Marital Status: Single    Spouse Name: N/A     Number of Children: N/A  . Years of Education: N/A   Occupational History  . Not on file.   Social History Main Topics  . Smoking status: Never Smoker   . Smokeless tobacco: Never Used  . Alcohol Use: No  . Drug Use: No  . Sexual Activity: Not on file   Other Topics Concern  . Not on file   Social History Narrative  . No narrative on file    Medications:   Current Outpatient Prescriptions on File Prior to Visit  Medication Sig Dispense Refill  . acetaminophen (TYLENOL) 500 MG tablet Take 500 mg by mouth every 6 (six) hours as needed.        Marland Kitchen aspirin 81 MG tablet Take 81 mg by mouth daily.        Marland Kitchen CALCIUM CITRATE-VITAMIN D PO Take by mouth daily.        . Cholecalciferol (VITAMIN D3) 1000 UNITS CAPS Take by mouth daily.        . clidinium-chlordiazePOXIDE (LIBRAX) 2.5-5 MG per capsule Take 1 capsule by mouth 2 (two) times daily.        . Coenzyme Q10 (COQ10) 100 MG CAPS Take by mouth daily.        Marland Kitchen letrozole (FEMARA) 2.5 MG tablet Take 1 tablet (2.5 mg total) by mouth daily.  90 tablet  3  . lisinopril (PRINIVIL,ZESTRIL) 5 MG tablet Take 5 mg by mouth daily.      . Multiple Vitamin (STRESS B) TABS Take by mouth daily.        Marland Kitchen  nitrofurantoin (MACRODANTIN) 100 MG capsule Take 100 mg by mouth at bedtime.       Marland Kitchen omeprazole (PRILOSEC) 20 MG capsule Take 20 mg by mouth every other day.       . potassium citrate (UROCIT-K) 10 MEQ (1080 MG) SR tablet Take 10 mEq by mouth 2 (two) times daily.       . propranolol ER (INDERAL LA) 60 MG 24 hr capsule Take 60 mg by mouth daily.      . rosuvastatin (CRESTOR) 5 MG tablet Take 5 mg by mouth 3 (three) times a week.       No current facility-administered medications on file prior to visit.    Allergies:  No Known Allergies  Physical Exam General: well developed, well nourished, seated, in no evident distress Head: head normocephalic and atraumatic. Orohparynx benign Neck: supple with no carotid or supraclavicular  bruits Cardiovascular: regular rate and rhythm, no murmurs Musculoskeletal: no deformity Skin:  no rash/petichiae Vascular:  Normal pulses all extremities Filed Vitals:   06/09/13 1330  BP: 132/72  Pulse: 98  Temp: 98.5 F (36.9 C)    Neurologic Exam Mental Status: Awake and fully alert. Oriented to place and time. Recent and remote memory intact. Attention span, concentration and fund of knowledge appropriate. Mood and affect appropriate. MMSE 30/30. Clock drawing 4/4. Animal Naming Test 16. Cranial Nerves: Fundoscopic exam reveals sharp disc margins. Pupils equal, briskly reactive to light. Extraocular movements full without nystagmus. Visual fields full to confrontation. Hearing intact. Facial sensation intact. Face, tongue, palate moves normally and symmetrically.  Motor: Normal bulk and tone. Normal strength in all tested extremity muscles. Sensory.: intact to tough and pinprick and vibratory.  Coordination: Rapid alternating movements normal in all extremities. Finger-to-nose and heel-to-shin performed accurately bilaterally. Gait and Station: Arises from chair without difficulty. Stance is normal. Gait demonstrates normal stride length and balance . Able to heel, toe and tandem walk without difficulty.  Reflexes: 1+ and symmetric. Toes downgoing.      ASSESSMENT: 33 year lady with progressive mild memory difficulties  X 2 years- anxiety related versus mild cognitive impairment.    PLAN: I had a long discussion with patient and sister and answered questions. Start fish oil 2 caps daily and Cerefolin nac one tablet daily. I gave her a prescription and samples of Cerefolin nac to take. Check memory panel labs, EEG, MRI scan and referred to Dr. Leonides Hanson for neuropsychological testing to tease out the relative contributions of anxiety versus mild cognitive impairment to her memory symptoms. Return for followup in 4 weeks or call earlier if necessary.

## 2013-06-10 LAB — RPR: RPR: NONREACTIVE

## 2013-06-10 LAB — VITAMIN B12: Vitamin B-12: 944 pg/mL (ref 211–946)

## 2013-06-10 LAB — TSH: TSH: 1.09 u[IU]/mL (ref 0.450–4.500)

## 2013-06-12 NOTE — Progress Notes (Signed)
Quick Note:  I called and spoke to sister, Mammie Russian, and relayed normal lab results to her. She will relay to pt. ______

## 2013-06-19 ENCOUNTER — Other Ambulatory Visit (INDEPENDENT_AMBULATORY_CARE_PROVIDER_SITE_OTHER): Payer: Medicare Other | Admitting: Radiology

## 2013-06-19 DIAGNOSIS — R413 Other amnesia: Secondary | ICD-10-CM

## 2013-06-20 ENCOUNTER — Ambulatory Visit
Admission: RE | Admit: 2013-06-20 | Discharge: 2013-06-20 | Disposition: A | Discharge status: Home / Self Care | Payer: Medicare Other | Source: Ambulatory Visit | Attending: Neurology | Admitting: Neurology

## 2013-06-20 DIAGNOSIS — R413 Other amnesia: Secondary | ICD-10-CM

## 2013-06-20 DIAGNOSIS — G3184 Mild cognitive impairment, so stated: Secondary | ICD-10-CM

## 2013-06-20 MED ORDER — GADOBENATE DIMEGLUMINE 529 MG/ML IV SOLN
14.0000 mL | Freq: Once | INTRAVENOUS | Status: AC | PRN
  Administered 2013-06-20: 14 mL via INTRAVENOUS

## 2013-06-23 NOTE — Progress Notes (Signed)
Quick Note:  I called and relayed the MRI results to pt. She verbalized understanding. She wrote things down for her sister. I will mail copy to [employer]. ______

## 2013-06-24 ENCOUNTER — Telehealth: Payer: Self-pay | Admitting: Neurology

## 2013-06-24 NOTE — Telephone Encounter (Signed)
I called home # and LMVM for pt or her sister about the upcoming neuropsych testing.

## 2013-06-24 NOTE — Progress Notes (Signed)
Quick Note:  I called pt and relayed the EEG results (normal). She asked about the neuropsych testing. I recommended that she proceed with this. ______

## 2013-06-25 ENCOUNTER — Telehealth: Payer: Self-pay | Admitting: Neurology

## 2013-06-25 NOTE — Telephone Encounter (Signed)
I called and spoke to pt and let her know of the reason for Neuropsych testing and would proceed with this as ordered per Dr. Pearlean Brownie.

## 2013-06-30 LAB — HM COLONOSCOPY: HM Colonoscopy: NORMAL

## 2013-07-02 ENCOUNTER — Emergency Department: Payer: Self-pay

## 2013-07-02 LAB — CBC WITH DIFFERENTIAL/PLATELET
Basophil %: 1 %
Eosinophil #: 0.1 10*3/uL (ref 0.0–0.7)
Eosinophil %: 1.5 %
HCT: 36.7 % (ref 35.0–47.0)
Lymphocyte #: 1 10*3/uL (ref 1.0–3.6)
MCHC: 35.2 g/dL (ref 32.0–36.0)
Monocyte #: 0.8 x10 3/mm (ref 0.2–0.9)
Neutrophil #: 5.3 10*3/uL (ref 1.4–6.5)
Neutrophil %: 72.9 %
Platelet: 207 10*3/uL (ref 150–440)
RBC: 4.19 10*6/uL (ref 3.80–5.20)
RDW: 12.7 % (ref 11.5–14.5)

## 2013-07-02 LAB — COMPREHENSIVE METABOLIC PANEL
Anion Gap: 3 — ABNORMAL LOW (ref 7–16)
Co2: 29 mmol/L (ref 21–32)
EGFR (African American): >60
Glucose: 149 mg/dL — ABNORMAL HIGH (ref 65–99)
SGOT(AST): 29 U/L (ref 15–37)
Sodium: 137 mmol/L (ref 136–145)

## 2013-07-02 LAB — URINALYSIS, COMPLETE
Bacteria: NONE SEEN
Bilirubin,UR: NEGATIVE
Blood: NEGATIVE
Glucose,UR: NEGATIVE mg/dL
Ketone: NEGATIVE
Leukocyte Esterase: NEGATIVE
Nitrite: NEGATIVE
Ph: 6
Protein: NEGATIVE
RBC,UR: 4 /HPF
Specific Gravity: 1.01
Squamous Epithelial: NONE SEEN
WBC UR: 4 /HPF

## 2013-07-14 ENCOUNTER — Ambulatory Visit: Payer: Self-pay | Admitting: Family Medicine

## 2013-07-14 ENCOUNTER — Other Ambulatory Visit: Payer: Self-pay | Admitting: *Deleted

## 2013-07-14 DIAGNOSIS — R0781 Pleurodynia: Secondary | ICD-10-CM

## 2013-07-14 DIAGNOSIS — D0592 Unspecified type of carcinoma in situ of left breast: Secondary | ICD-10-CM

## 2013-07-14 MED ORDER — LISINOPRIL 5 MG PO TABS: ORAL_TABLET | ORAL | Status: DC

## 2013-07-14 NOTE — Telephone Encounter (Signed)
Rx was sent to pharmacy electronically. 

## 2013-08-12 ENCOUNTER — Encounter: Payer: Self-pay | Admitting: Neurology

## 2013-08-12 ENCOUNTER — Ambulatory Visit (INDEPENDENT_AMBULATORY_CARE_PROVIDER_SITE_OTHER): Payer: Medicare Other | Admitting: Neurology

## 2013-08-12 VITALS — BP 139/82 | HR 101 | Temp 99.1°F | Ht 66.5 in | Wt 144.0 lb

## 2013-08-12 DIAGNOSIS — R413 Other amnesia: Secondary | ICD-10-CM

## 2013-08-12 NOTE — Progress Notes (Addendum)
[facility] [address] [place]. [place] [address] [phone]       OFFICE FOLLOW UP NOTE  [name] Date of Birth:  Jul 15, 1936 Medical Record Number:  [id number]   Referring MD:  Hendricks Hanson  Reason for Referral:  Memory loss  HPI: 33 year Caucasian lady  having mild progressive memory and cognitive difficulties for last 2 years which have been mildly progressive.She forgets  recent  Events, tasks and at times struggles to find words and finish sentences stopping in midsentence or occcasionally even stuttering..She misplaces things.at times she has been noticed as staring blankly into space and transiently unaware of her surroundings.She has longstanding h/o anxiety and has been on Librax 2.5 mg twice daily  For years and Inderal LA 60 mg has been added recently.She is yet independent in her ADLs and lives alone. There is no h/o headache, seizure, TIA, stroke, significant head injury with loss of consciousness.She has not had any brain imaging or lab work for causes of memory loss or detailed neuropsych testing. Update 08/12/13 : She returns for followup today and states that she did not start taking the fish oil as I had instructed and took only  the samples of Cerefolin I gave her and when she ran out she did not fill the prescription. She states her memory difficulties the about the same and anxiety seems to be increased. She did undergo the tests which I had ordered however. Vitamin B12, TSH and RPR on 06/09/13 were all normal. EEG done and 06/19/13 was normal without any epileptiform features. MRI scan of the brain on 06/20/13 showed mild changes of chronic microvascular ischemia and generalized cerebral atrophy. She admits that she is quite anxious and she has been taking Lipper ask and Inderal for a long time and has seen only her primary physician and perhaps may benefit by referral to a psychiatrist. She did not keep the appointment which we had made  [facility] as she prefers Dr Brandi Hanson in [place] which is shorter drive for her. ROS:   14 system review of systems is positive for eye pain,cpough,chest pain,palpititions,hearing loss,ringing in ears,itching,eye pain,easy bruising,confusion,headache,numbness,dizziness,sleepiness,snoring,depression,anxiety,not enough sleep,disinterest in activities, racing thoughts.  PMH:  Past Medical History  Diagnosis Date  . Hypertension   . PVC (premature ventricular contraction)   . Hyperlipidemia   . Arthritis   . Sleep apnea   . GERD (gastroesophageal reflux disease)   . Cancer     skin  . Anxiety   . Osteoporosis   . Abdominal pain   . Abdominal distention   . Palpitations   . Generalized headaches   . Confusion   . Rectal pain   . Breast cancer in situ 07/22/2011  . Benign essential HTN 01/02/2012  . Hyperlipidemia 01/02/2012    Social History:  History   Social History  . Marital Status: Single    Spouse Name: N/A    Number of Children: N/A  . Years of Education: N/A   Occupational History  . Not on file.   Social History Main Topics  . Smoking status: Never Smoker   . Smokeless tobacco: Never Used  . Alcohol Use: No  . Drug Use: No  . Sexual Activity: Not on file   Other Topics Concern  . Not on file   Social History Narrative  . No narrative on file    Medications:   Current Outpatient Prescriptions on File Prior to Visit  Medication Sig Dispense Refill  . aspirin  81 MG tablet Take 81 mg by mouth daily.        Marland Kitchen CALCIUM CITRATE-VITAMIN D PO Take by mouth daily.        . Cholecalciferol (VITAMIN D3) 1000 UNITS CAPS Take by mouth daily.        . Coenzyme Q10 (COQ10) 100 MG CAPS Take by mouth daily.        Marland Kitchen letrozole (FEMARA) 2.5 MG tablet Take 1 tablet (2.5 mg total) by mouth daily.  90 tablet  3  . lisinopril (PRINIVIL,ZESTRIL) 5 MG tablet Take 1 tablet by mouth one time daily. <Please schedule appointment>  90 tablet  0  . Multiple Vitamin  (STRESS B) TABS Take by mouth daily.        . potassium citrate (UROCIT-K) 10 MEQ (1080 MG) SR tablet Take 10 mEq by mouth 2 (two) times daily.       . propranolol (INDERAL) 60 MG tablet Take 1 tablet by mouth daily.      . rosuvastatin (CRESTOR) 5 MG tablet Take 5 mg by mouth 3 (three) times a week.       No current facility-administered medications on file prior to visit.    Allergies:  No Known Allergies  Physical Exam General: well developed, well nourished, seated, in no evident distress Head: head normocephalic and atraumatic. Orohparynx benign Neck: supple with no carotid or supraclavicular bruits Cardiovascular: regular rate and rhythm, no murmurs Musculoskeletal: no deformity Skin:  no rash/petichiae Vascular:  Normal pulses all extremities Filed Vitals:   08/12/13 1435  BP: 139/82  Pulse: 101  Temp: 99.1 F (37.3 C)    Neurologic Exam Mental Status: Awake and fully alert. Oriented to place and time. Recent and remote memory intact. Attention span, concentration and fund of knowledge appropriate. Mood and affect appropriate. MMSE 30/30. Clock drawing 4/4. Animal Naming Test 7. Geriatric depression scale for only Cranial Nerves: Fundoscopic exam reveals sharp disc margins. Pupils equal, briskly reactive to light. Extraocular movements full without nystagmus. Visual fields full to confrontation. Hearing intact. Facial sensation intact. Face, tongue, palate moves normally and symmetrically.  Motor: Normal bulk and tone. Normal strength in all tested extremity muscles. Sensory.: intact to tough and pinprick and vibratory.  Coordination: Rapid alternating movements normal in all extremities. Finger-to-nose and heel-to-shin performed accurately bilaterally. Gait and Station: Arises from chair without difficulty. Stance is normal. Gait demonstrates normal stride length and balance . Able to heel, toe and tandem walk without difficulty.  Reflexes: 1+ and symmetric. Toes downgoing.        ASSESSMENT: 25 year lady with progressive mild memory difficulties  X 2 years- anxiety related versus mild cognitive impairment.    PLAN: I had a long discussion with patient and nephew and answered questions. We discussed age-related mild cognitive impairment as well as untreated anxiety possibly contributing to her  memory deficits Start fish oil 2 caps daily  and referred to Dr. Leonides Hanson for neuropsychological testing to tease out the relative contributions of anxiety versus mild cognitive impairment to her memory symptoms.I had a long discussion with the patient and nephew about the results of MRI, EEG, lab work and cognitive testing in the office. I feel she has mild age related cognitive impairment as well as significant underlying anxiety component. Recommend neuropsych testing and management of anxiety as per her primary physician. No further followup appointment is needed with me.

## 2013-08-12 NOTE — Patient Instructions (Signed)
I had a long discussion with the patient and nephew about the results of MRI, EEG, lab work and cognitive testing in the office. I feel she has mild age related cognitive impairment as well as significant underlying anxiety component. Recommend neuropsych testing and management of anxiety as per her primary physician. No further followup appointment is needed with me.

## 2013-08-18 ENCOUNTER — Encounter: Payer: Self-pay | Admitting: Cardiovascular Disease

## 2013-08-18 ENCOUNTER — Ambulatory Visit (INDEPENDENT_AMBULATORY_CARE_PROVIDER_SITE_OTHER): Payer: Medicare Other | Admitting: Cardiovascular Disease

## 2013-08-18 VITALS — BP 132/70 | HR 98 | Resp 16 | Ht 67.5 in | Wt 143.2 lb

## 2013-08-18 DIAGNOSIS — R002 Palpitations: Secondary | ICD-10-CM

## 2013-08-18 DIAGNOSIS — E785 Hyperlipidemia, unspecified: Secondary | ICD-10-CM

## 2013-08-18 NOTE — Patient Instructions (Signed)
Dr. Salena Saner recommends you wear compression stockings during your waking hours.  Dr. Salena Saner recommends that you schedule a follow-up appointment in: One year.

## 2013-08-21 ENCOUNTER — Telehealth: Payer: Self-pay | Admitting: Cardiovascular Disease

## 2013-08-21 ENCOUNTER — Encounter: Payer: Self-pay | Admitting: Cardiovascular Disease

## 2013-08-21 DIAGNOSIS — R002 Palpitations: Secondary | ICD-10-CM | POA: Insufficient documentation

## 2013-08-21 NOTE — Telephone Encounter (Signed)
Paper chart requested to Dr. Renaye Rakers cart.

## 2013-08-21 NOTE — Assessment & Plan Note (Signed)
These have responded well to propranolol. It does not appear to be any evidence of significant structural heart disease by previous workup and no complex arrhythmia by previous Holter monitoring. I see no reason to change his current medical regimen.

## 2013-08-21 NOTE — Telephone Encounter (Signed)
Returned call and informed pt that no change in studies from 2009 to 2011.  Also informed not necessary to repeat unless symptoms of "mini stroke" or change on exam.  Pt verbalized understanding and agreed w/ plan.

## 2013-08-21 NOTE — Telephone Encounter (Signed)
Returned call and pt verified x 2.  Pt stated she has a couple of things she was concerned about from her appt on Monday.  Pt stated when she was a patient of Dr. Clarene Duke, he would check her carotid arteries every now and then.  Stated she also forgot to mention a tingling in her L hand that is worrisome.  Stated it mostly happens at night and is painful sometimes.    Pt informed Dr. Salena Saner will be notified about her concerns with her carotid arteries for further instructions.  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  Stated she and Dr. Salena Saner talked about that at her appt. Pt would like her mind to be eased by knowing what Dr. Salena Saner thinks about her carotid arteries and is fine if he thinks she needs to come in again or not.  Pt also advised to see PCP r/t L hand tingling/pain as she denied CP or SOB or other symptoms w/ tingling.  Informed she may have carpal tunnel syndrome and pt stated she does have that.  Pt informed she may need to wear splints and advised she see her PCP.  Pt verbalized understanding and agreed w/ plan.  Message forwarded to Dr. Royann Shivers.

## 2013-08-21 NOTE — Assessment & Plan Note (Signed)
She does not have known vascular disease there is no evidence of significant atherosclerotic peripheral disease. If there is any concern that the statin may be contributing to her cognitive difficulties, it should be discontinued.

## 2013-08-21 NOTE — Progress Notes (Signed)
Patient ID: Brandi Hanson, female   DOB: Mar 09, 1936, 77 y.o.   MRN: [id number]    Reason for office visit Palpitations  Mrs. [facility] followed with Dr. Clarene Hanson for years for palpitations. She is tried a variety of medications for palliation of these complaints and eventually settled on propranolol which seems to cause a few his side effects. Many times when seen in the office was found to have very mild sinus tachycardia end of sustained atrial arrhythmia has been identified. Occasional ventricular bigeminy and trigeminy were seen on a Holter monitor performed in the remote past. She self-admittedly is a very anxious person and emotional situations often worsen her palpitations. Recently she has been troubled by her inability to concentrate and worsening lapses of memory. Chest had a neurological evaluation with Dr. Pearlean Hanson who feels that her difficulties and be due to both early cognitive impairment and anxiety and suggested neuropsychological testing. MRI, EEG and blood tests have generally been unrevealing. She does not have a history of stroke or TIA. Carotid duplex ultrasonography performed in 2009 and again in 2011 showed minimal carotid plaque and no evidence of disease progression during the two-year interval. She also has bilateral varicose veins but has never had stasis ulcers or severe symptoms of venous insufficiency. The right lower extremity has always had more prominent dilated veins in the left. She also has systemic hypertension which has been easily managed with the propranolol and a low dose of ACE inhibitor. In 2000 1221 surgery and radiation therapy for left breast cancer. She is considered to be in remission at this time.     Allergies  Allergen Reactions  . Sulfa Antibiotics     Current Outpatient Prescriptions  Medication Sig Dispense Refill  . acetaminophen (TYLENOL) 500 MG tablet Take 500 mg by mouth every 6 (six) hours as needed.      Marland Kitchen aspirin 81 MG tablet Take 81 mg by  mouth daily.        . calcium carbonate (TUMS EX) 750 MG chewable tablet Chew 1 tablet by mouth daily.      Marland Kitchen CALCIUM CITRATE-VITAMIN D PO Take by mouth daily.        . Cholecalciferol (VITAMIN D3) 1000 UNITS CAPS Take by mouth daily.        . clidinium-chlordiazePOXIDE (LIBRAX) 5-2.5 MG per capsule Take 1 capsule by mouth 2 (two) times daily.      . Coenzyme Q10 (COQ10) 100 MG CAPS Take by mouth daily.        Marland Kitchen letrozole (FEMARA) 2.5 MG tablet Take 1 tablet (2.5 mg total) by mouth daily.  90 tablet  3  . lisinopril (PRINIVIL,ZESTRIL) 5 MG tablet Take 1 tablet by mouth one time daily. <Please schedule appointment>  90 tablet  0  . Multiple Vitamin (STRESS B) TABS Take by mouth daily.        . Omega-3 Fatty Acids (FISH OIL) 1000 MG CPDR Take 1,000 mg by mouth daily.      . polyethylene glycol (MIRALAX / GLYCOLAX) packet Take 17 g by mouth daily as needed.      . potassium citrate (UROCIT-K) 10 MEQ (1080 MG) SR tablet Take 10 mEq by mouth 2 (two) times daily.       . propranolol (INDERAL) 60 MG tablet Take 1 tablet by mouth daily.      . rosuvastatin (CRESTOR) 5 MG tablet Take 5 mg by mouth 3 (three) times a week.       No current facility-administered medications for  this visit.    Past Medical History  Diagnosis Date  . Hypertension   . PVC (premature ventricular contraction)   . Hyperlipidemia   . Arthritis   . Sleep apnea   . GERD (gastroesophageal reflux disease)   . Cancer     skin  . Anxiety   . Osteoporosis   . Abdominal pain   . Abdominal distention   . Palpitations   . Generalized headaches   . Confusion   . Rectal pain   . Breast cancer in situ 07/22/2011  . Benign essential HTN 01/02/2012  . Hyperlipidemia 01/02/2012    Past Surgical History  Procedure Laterality Date  . Abdominal hysterectomy  1978  . Breast biopsy  1990, 1994    left  . Foot surgery  2005  . Rotator cuff repair  2007  . Cataract extraction  2011    bilateral  . Kidney stone surgery  2011     Family History  Problem Relation Age of Onset  . Cancer Mother     breast  . Cancer Father     lung  . Cancer Sister     breast and uterine    History   Social History  . Marital Status: Single    Spouse Name: N/A    Number of Children: N/A  . Years of Education: N/A   Occupational History  . Not on file.   Social History Main Topics  . Smoking status: Never Smoker   . Smokeless tobacco: Never Used  . Alcohol Use: No  . Drug Use: No  . Sexual Activity: Not on file   Other Topics Concern  . Not on file   Social History Narrative  . No narrative on file    Review of systems: The patient specifically denies any chest pain at rest or with exertion, dyspnea at rest or with exertion, orthopnea, paroxysmal nocturnal dyspnea, syncope, palpitations, focal neurological deficits, intermittent claudication, lower extremity edema, unexplained weight gain, cough, hemoptysis or wheezing.  The patient also denies abdominal pain, nausea, vomiting, dysphagia, diarrhea, constipation, polyuria, polydipsia, dysuria, hematuria, frequency, urgency, abnormal bleeding or bruising, fever, chills, unexpected weight changes, mood swings, change in skin or hair texture, change in voice quality, auditory or visual problems, allergic reactions or rashes, new musculoskeletal complaints other than usual "aches and pains".   PHYSICAL EXAM BP 132/70  Pulse 98  Resp 16  Ht 5' 7.5" (1.715 m)  Wt 143 lb 3.2 oz (64.955 kg)  BMI 22.08 kg/m2  General: Alert, oriented x3, no distress Head: no evidence of trauma, PERRL, EOMI, no exophtalmos or lid lag, no myxedema, no xanthelasma; normal ears, nose and oropharynx Neck: normal jugular venous pulsations and no hepatojugular reflux; brisk carotid pulses without delay and no carotid bruits Chest: clear to auscultation, no signs of consolidation by percussion or palpation, normal fremitus, symmetrical and full respiratory excursions Cardiovascular: normal  position and quality of the apical impulse, regular rhythm, normal first and second heart sounds, no murmurs, rubs or gallops Abdomen: no tenderness or distention, no masses by palpation, no abnormal pulsatility or arterial bruits, normal bowel sounds, no hepatosplenomegaly Extremities: Prominent varicose veins especially on the medial surface of the right calf; no clubbing, cyanosis or edema; 2+ radial, ulnar and brachial pulses bilaterally; 2+ right femoral, posterior tibial and dorsalis pedis pulses; 2+ left femoral, posterior tibial and dorsalis pedis pulses; no subclavian or femoral bruits Neurological: grossly nonfocal   EKG: Normal sinus rhythm, slightly delayed anterior wall R  wave progression. Patient previous tracings, no acute repolarization abnormalities  Lipid Panel December 2013 total cholesterol 174, triglycerides 110, HDL 46, LDL 106 Creatinine 0.67, normal liver function tests   BMET    Component Value Date/Time   NA 140 12/31/2012 1032   NA 137 04/09/2012 1439   K 4.3 12/31/2012 1032   K 4.1 04/09/2012 1439   CL 105 12/31/2012 1032   CL 99 04/09/2012 1439   CO2 28 12/31/2012 1032   CO2 31 04/09/2012 1439   GLUCOSE 101* 12/31/2012 1032   GLUCOSE 121* 04/09/2012 1439   BUN 23.6 12/31/2012 1032   BUN 13 04/09/2012 1439   CREATININE 0.7 12/31/2012 1032   CREATININE 0.7 04/09/2012 1439   CALCIUM 9.2 12/31/2012 1032   CALCIUM 9.4 04/09/2012 1439   GFRNONAA >60 10/17/2010 1444   GFRAA  Value: >60        The eGFR has been calculated using the MDRD equation. This calculation has not been validated in all clinical situations. eGFR's persistently <60 mL/min signify possible Chronic Kidney Disease. 10/17/2010 1444     ASSESSMENT AND PLAN Palpitations These have responded well to propranolol. It does not appear to be any evidence of significant structural heart disease by previous workup and no complex arrhythmia by previous Holter monitoring. I see no reason to change his current medical  regimen.  Hyperlipidemia She does not have known vascular disease there is no evidence of significant atherosclerotic peripheral disease. If there is any concern that the statin may be contributing to her cognitive difficulties, it should be discontinued.   Orders Placed This Encounter  Procedures  . EKG 12-Lead   Meds ordered this encounter  Medications  . clidinium-chlordiazePOXIDE (LIBRAX) 5-2.5 MG per capsule    Sig: Take 1 capsule by mouth 2 (two) times daily.  . polyethylene glycol (MIRALAX / GLYCOLAX) packet    Sig: Take 17 g by mouth daily as needed.  . Omega-3 Fatty Acids (FISH OIL) 1000 MG CPDR    Sig: Take 1,000 mg by mouth daily.    Junious Silk, MD, [facility] [phone] office [phone] pager

## 2013-08-21 NOTE — Telephone Encounter (Signed)
Was her on Monday,please call. She need to ask some questions.

## 2013-08-21 NOTE — Telephone Encounter (Signed)
Actually, I looked at it today. She had carotid studies checked in 2009 and 2011. The amount of plaque was very mild and importantly, unchanged on serial comparison. Repeat scans are not necessary except if she develops focal neurological complaints (like a "mini-stroke" or TIA) or if there is a marked change on exam.

## 2013-08-21 NOTE — Telephone Encounter (Signed)
I need to look at her paper chart and will do that tomorrow

## 2013-09-18 ENCOUNTER — Telehealth: Payer: Self-pay | Admitting: *Deleted

## 2013-09-18 ENCOUNTER — Other Ambulatory Visit: Payer: Self-pay | Admitting: Oncology

## 2013-09-18 ENCOUNTER — Other Ambulatory Visit: Payer: Self-pay | Admitting: *Deleted

## 2013-09-18 DIAGNOSIS — D0590 Unspecified type of carcinoma in situ of unspecified breast: Secondary | ICD-10-CM

## 2013-09-18 DIAGNOSIS — D0592 Unspecified type of carcinoma in situ of left breast: Secondary | ICD-10-CM

## 2013-09-18 NOTE — Telephone Encounter (Signed)
Per staff message and POF I have scheduled appts.  [name]  

## 2013-09-18 NOTE — Telephone Encounter (Signed)
Received vm call from pt's sister stating that pt had been getting IV med for her bones twice a year & wants to know if she is supposed to be getting this this year.  Reviewed chart & pt received zometa last in April 2014.  Her next f/u app is scheduled for May with Dr Beryle Beams but Dr. Beryle Beams will not be here on this date.  Will discuss with Dr Beryle Beams.

## 2013-09-22 ENCOUNTER — Telehealth: Payer: Self-pay | Admitting: Oncology

## 2013-09-22 NOTE — Telephone Encounter (Signed)
Talked to pt and she is aware of appt for lab and Zometa tomorrow, advised pt to get appt calendar for May 2015 °

## 2013-09-23 ENCOUNTER — Ambulatory Visit (HOSPITAL_BASED_OUTPATIENT_CLINIC_OR_DEPARTMENT_OTHER): Payer: Medicare Other

## 2013-09-23 ENCOUNTER — Other Ambulatory Visit (HOSPITAL_BASED_OUTPATIENT_CLINIC_OR_DEPARTMENT_OTHER): Payer: Medicare Other

## 2013-09-23 VITALS — BP 115/61 | HR 90 | Temp 98.0°F | Resp 16

## 2013-09-23 DIAGNOSIS — D0590 Unspecified type of carcinoma in situ of unspecified breast: Secondary | ICD-10-CM

## 2013-09-23 DIAGNOSIS — D0592 Unspecified type of carcinoma in situ of left breast: Secondary | ICD-10-CM

## 2013-09-23 LAB — COMPREHENSIVE METABOLIC PANEL (CC13)
ALK PHOS: 70 U/L (ref 40–150)
ALT: 16 U/L (ref 0–55)
AST: 17 U/L (ref 5–34)
Albumin: 4 g/dL (ref 3.5–5.0)
Anion Gap: 9 mEq/L (ref 3–11)
BUN: 19.1 mg/dL (ref 7.0–26.0)
CALCIUM: 9.8 mg/dL (ref 8.4–10.4)
CHLORIDE: 104 meq/L (ref 98–109)
CO2: 30 mEq/L — ABNORMAL HIGH (ref 22–29)
CREATININE: 0.7 mg/dL (ref 0.6–1.1)
Glucose: 152 mg/dl — ABNORMAL HIGH (ref 70–140)
Potassium: 3.9 mEq/L (ref 3.5–5.1)
Sodium: 143 mEq/L (ref 136–145)
Total Bilirubin: 0.65 mg/dL (ref 0.20–1.20)
Total Protein: 6.8 g/dL (ref 6.4–8.3)

## 2013-09-23 LAB — CBC WITH DIFFERENTIAL/PLATELET
BASO%: 0.7 % (ref 0.0–2.0)
BASOS ABS: 0 10*3/uL (ref 0.0–0.1)
EOS%: 1.4 % (ref 0.0–7.0)
Eosinophils Absolute: 0.1 10*3/uL (ref 0.0–0.5)
HEMATOCRIT: 38.9 % (ref 34.8–46.6)
HEMOGLOBIN: 13.1 g/dL (ref 11.6–15.9)
LYMPH%: 16.6 % (ref 14.0–49.7)
MCH: 30 pg (ref 25.1–34.0)
MCHC: 33.5 g/dL (ref 31.5–36.0)
MCV: 89.6 fL (ref 79.5–101.0)
MONO#: 0.6 10*3/uL (ref 0.1–0.9)
MONO%: 9.4 % (ref 0.0–14.0)
NEUT#: 4.6 10*3/uL (ref 1.5–6.5)
NEUT%: 71.9 % (ref 38.4–76.8)
PLATELETS: 202 10*3/uL (ref 145–400)
RBC: 4.34 10*6/uL (ref 3.70–5.45)
RDW: 13 % (ref 11.2–14.5)
WBC: 6.4 10*3/uL (ref 3.9–10.3)
lymph#: 1.1 10*3/uL (ref 0.9–3.3)

## 2013-09-23 MED ORDER — ZOLEDRONIC ACID 4 MG/100ML IV SOLN
4.0000 mg | Freq: Once | INTRAVENOUS | Status: AC
  Administered 2013-09-23: 4 mg via INTRAVENOUS
  Filled 2013-09-23: qty 100

## 2013-09-23 MED ORDER — SODIUM CHLORIDE 0.9 % IV SOLN
Freq: Once | INTRAVENOUS | Status: AC
  Administered 2013-09-23: 14:00:00 via INTRAVENOUS

## 2013-09-23 NOTE — Patient Instructions (Signed)

## 2013-09-24 ENCOUNTER — Telehealth: Payer: Self-pay | Admitting: *Deleted

## 2013-09-24 NOTE — Telephone Encounter (Signed)
Per Dr. Beryle Beams; notified pt lab OK except mild elevation of sugar at 152.  Pt verbalized understanding and stated "I'll watch my sugar intake"

## 2013-09-24 NOTE — Telephone Encounter (Signed)
Message copied by Domenic Schwab on Wed Sep 24, 2013  4:30 PM ------      Message from: Annia Belt      Created: Wed Sep 24, 2013 10:51 AM       Call pt: lab OK except mild elevation of sugar at 152 ------

## 2013-10-01 ENCOUNTER — Other Ambulatory Visit: Payer: Self-pay | Admitting: Oncology

## 2013-10-06 ENCOUNTER — Other Ambulatory Visit: Payer: Self-pay

## 2013-10-06 DIAGNOSIS — R0781 Pleurodynia: Secondary | ICD-10-CM

## 2013-10-06 MED ORDER — LISINOPRIL 5 MG PO TABS: 5.0000 mg | ORAL_TABLET | Freq: Every day | ORAL | Status: DC

## 2013-10-06 NOTE — Telephone Encounter (Signed)
Rx was sent to pharmacy electronically. 

## 2013-10-27 ENCOUNTER — Other Ambulatory Visit: Payer: Self-pay | Admitting: *Deleted

## 2013-10-27 MED ORDER — PROPRANOLOL HCL 60 MG PO TABS: 60.0000 mg | ORAL_TABLET | Freq: Every day | ORAL | Status: DC

## 2013-11-08 ENCOUNTER — Encounter: Payer: Self-pay | Admitting: Oncology

## 2014-01-12 ENCOUNTER — Other Ambulatory Visit: Payer: Medicare Other

## 2014-01-12 ENCOUNTER — Ambulatory Visit: Payer: Medicare Other | Admitting: Oncology

## 2014-01-12 ENCOUNTER — Ambulatory Visit: Payer: BC Managed Care – PPO | Admitting: Adult Health

## 2014-01-12 ENCOUNTER — Other Ambulatory Visit: Payer: BC Managed Care – PPO

## 2014-01-13 ENCOUNTER — Other Ambulatory Visit (HOSPITAL_BASED_OUTPATIENT_CLINIC_OR_DEPARTMENT_OTHER): Payer: Medicare Other

## 2014-01-13 ENCOUNTER — Ambulatory Visit (HOSPITAL_BASED_OUTPATIENT_CLINIC_OR_DEPARTMENT_OTHER): Payer: Medicare Other | Admitting: Internal Medicine

## 2014-01-13 VITALS — BP 138/68 | HR 85 | Temp 98.1°F | Resp 18 | Ht 67.5 in | Wt 139.2 lb

## 2014-01-13 DIAGNOSIS — M899 Disorder of bone, unspecified: Secondary | ICD-10-CM

## 2014-01-13 DIAGNOSIS — F411 Generalized anxiety disorder: Secondary | ICD-10-CM

## 2014-01-13 DIAGNOSIS — D0590 Unspecified type of carcinoma in situ of unspecified breast: Secondary | ICD-10-CM

## 2014-01-13 DIAGNOSIS — Z79811 Long term (current) use of aromatase inhibitors: Secondary | ICD-10-CM

## 2014-01-13 DIAGNOSIS — I10 Essential (primary) hypertension: Secondary | ICD-10-CM

## 2014-01-13 DIAGNOSIS — M949 Disorder of cartilage, unspecified: Secondary | ICD-10-CM

## 2014-01-13 DIAGNOSIS — Z87898 Personal history of other specified conditions: Secondary | ICD-10-CM

## 2014-01-13 LAB — BASIC METABOLIC PANEL (CC13)
Anion Gap: 9 mEq/L (ref 3–11)
BUN: 20.2 mg/dL (ref 7.0–26.0)
CHLORIDE: 103 meq/L (ref 98–109)
CO2: 29 mEq/L (ref 22–29)
Calcium: 9.9 mg/dL (ref 8.4–10.4)
Creatinine: 0.8 mg/dL (ref 0.6–1.1)
Glucose: 106 mg/dl (ref 70–140)
POTASSIUM: 4.3 meq/L (ref 3.5–5.1)
Sodium: 141 mEq/L (ref 136–145)
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
## 2014-01-13 NOTE — Patient Instructions (Signed)
Letrozole tablets What is this medicine? LETROZOLE (LET roe zole) blocks the production of estrogen. Certain types of breast cancer grow under the influence of estrogen. Letrozole helps block tumor growth. This medicine is used to treat advanced breast cancer in postmenopausal women. This medicine may be used for other purposes; ask your health care provider or pharmacist if you have questions. COMMON BRAND NAME(S): Femara What should I tell my health care provider before I take this medicine? They need to know if you have any of these conditions: -liver disease -osteoporosis (weak bones) -an unusual or allergic reaction to letrozole, other medicines, foods, dyes, or preservatives -pregnant or trying to get pregnant -breast-feeding How should I use this medicine? Take this medicine by mouth with a glass of water. You may take it with or without food. Follow the directions on the prescription label. Take your medicine at regular intervals. Do not take your medicine more often than directed. Do not stop taking except on your doctor's advice. Talk to your pediatrician regarding the use of this medicine in children. Special care may be needed. Overdosage: If you think you have taken too much of this medicine contact a poison control center or emergency room at once. NOTE: This medicine is only for you. Do not share this medicine with others. What if I miss a dose? If you miss a dose, take it as soon as you can. If it is almost time for your next dose, take only that dose. Do not take double or extra doses. What may interact with this medicine? Do not take this medicine with any of the following medications: -estrogens, like hormone replacement therapy or birth control pills This medicine may also interact with the following medications: -dietary supplements such as androstenedione or DHEA -prasterone -tamoxifen This list may not describe all possible interactions. Give your health care provider  a list of all the medicines, herbs, non-prescription drugs, or dietary supplements you use. Also tell them if you smoke, drink alcohol, or use illegal drugs. Some items may interact with your medicine. What should I watch for while using this medicine? Visit your doctor or health care professional for regular check-ups to monitor your condition. Do not use this drug if you are pregnant. Serious side effects to an unborn child are possible. Talk to your doctor or pharmacist for more information. You may get drowsy or dizzy. Do not drive, use machinery, or do anything that needs mental alertness until you know how this medicine affects you. Do not stand or sit up quickly, especially if you are an older patient. This reduces the risk of dizzy or fainting spells. What side effects may I notice from receiving this medicine? Side effects that you should report to your doctor or health care professional as soon as possible: -allergic reactions like skin rash, itching, or hives -bone fracture -chest pain -difficulty breathing or shortness of breath -severe pain, swelling, warmth in the leg -unusually weak or tired -vaginal bleeding Side effects that usually do not require medical attention (report to your doctor or health care professional if they continue or are bothersome): -bone, back, joint, or muscle pain -dizziness -fatigue -fluid retention -headache -hot flashes, night sweats -nausea -weight gain This list may not describe all possible side effects. Call your doctor for medical advice about side effects. You may report side effects to FDA at 1-800-FDA-1088. Where should I keep my medicine? Keep out of the reach of children. Store between 15 and 30 degrees C (59 and 86  degrees F). Throw away any unused medicine after the expiration date. NOTE: This sheet is a summary. It may not cover all possible information. If you have questions about this medicine, talk to your doctor, pharmacist, or  health care provider.  2014, Elsevier/Gold Standard. (2007-11-08 16:43:44) Zoledronic Acid injection (Hypercalcemia, Oncology) What is this medicine? ZOLEDRONIC ACID (ZOE le dron ik AS id) lowers the amount of calcium loss from bone. It is used to treat too much calcium in your blood from cancer. It is also used to prevent complications of cancer that has spread to the bone. This medicine may be used for other purposes; ask your health care provider or pharmacist if you have questions. COMMON BRAND NAME(S): Zometa What should I tell my health care provider before I take this medicine? They need to know if you have any of these conditions: -aspirin-sensitive asthma -cancer, especially if you are receiving medicines used to treat cancer -dental disease or wear dentures -infection -kidney disease -receiving corticosteroids like dexamethasone or prednisone -an unusual or allergic reaction to zoledronic acid, other medicines, foods, dyes, or preservatives -pregnant or trying to get pregnant -breast-feeding How should I use this medicine? This medicine is for infusion into a vein. It is given by a health care professional in a hospital or clinic setting. Talk to your pediatrician regarding the use of this medicine in children. Special care may be needed. Overdosage: If you think you have taken too much of this medicine contact a poison control center or emergency room at once. NOTE: This medicine is only for you. Do not share this medicine with others. What if I miss a dose? It is important not to miss your dose. Call your doctor or health care professional if you are unable to keep an appointment. What may interact with this medicine? -certain antibiotics given by injection -NSAIDs, medicines for pain and inflammation, like ibuprofen or naproxen -some diuretics like bumetanide, furosemide -teriparatide -thalidomide This list may not describe all possible interactions. Give your health care  provider a list of all the medicines, herbs, non-prescription drugs, or dietary supplements you use. Also tell them if you smoke, drink alcohol, or use illegal drugs. Some items may interact with your medicine. What should I watch for while using this medicine? Visit your doctor or health care professional for regular checkups. It may be some time before you see the benefit from this medicine. Do not stop taking your medicine unless your doctor tells you to. Your doctor may order blood tests or other tests to see how you are doing. Women should inform their doctor if they wish to become pregnant or think they might be pregnant. There is a potential for serious side effects to an unborn child. Talk to your health care professional or pharmacist for more information. You should make sure that you get enough calcium and vitamin D while you are taking this medicine. Discuss the foods you eat and the vitamins you take with your health care professional. Some people who take this medicine have severe bone, joint, and/or muscle pain. This medicine may also increase your risk for jaw problems or a broken thigh bone. Tell your doctor right away if you have severe pain in your jaw, bones, joints, or muscles. Tell your doctor if you have any pain that does not go away or that gets worse. Tell your dentist and dental surgeon that you are taking this medicine. You should not have major dental surgery while on this medicine. See your dentist  to have a dental exam and fix any dental problems before starting this medicine. Take good care of your teeth while on this medicine. Make sure you see your dentist for regular follow-up appointments. What side effects may I notice from receiving this medicine? Side effects that you should report to your doctor or health care professional as soon as possible: -allergic reactions like skin rash, itching or hives, swelling of the face, lips, or tongue -anxiety, confusion, or  depression -breathing problems -changes in vision -eye pain -feeling faint or lightheaded, falls -jaw pain, especially after dental work -mouth sores -muscle cramps, stiffness, or weakness -trouble passing urine or change in the amount of urine Side effects that usually do not require medical attention (report to your doctor or health care professional if they continue or are bothersome): -bone, joint, or muscle pain -constipation -diarrhea -fever -hair loss -irritation at site where injected -loss of appetite -nausea, vomiting -stomach upset -trouble sleeping -trouble swallowing -weak or tired This list may not describe all possible side effects. Call your doctor for medical advice about side effects. You may report side effects to FDA at 1-800-FDA-1088. Where should I keep my medicine? This drug is given in a hospital or clinic and will not be stored at home. NOTE: This sheet is a summary. It may not cover all possible information. If you have questions about this medicine, talk to your doctor, pharmacist, or health care provider.  2014, Elsevier/Gold Standard. (2013-02-06 13:03:13)

## 2014-01-16 ENCOUNTER — Telehealth: Payer: Self-pay | Admitting: Internal Medicine

## 2014-01-16 NOTE — Telephone Encounter (Signed)
, °

## 2014-03-24 ENCOUNTER — Ambulatory Visit: Payer: Medicare Other

## 2014-03-24 NOTE — Progress Notes (Signed)
Pt here for Zometa.  Pt complained of a sore on right lower gum line.  Nurse assessed and did not notice any opened sore, no bleeding, no redness area.  Pt stated the discomfort area occurred about 2 - 3 months.  Pt last had Zometa in Jan. 2015.  Pt is taking Femara 2.5mg  daily.  Pt also had question about if Zometa is necessary for pt.  Dr. Juliann Mule notified of pt's concerns.  Per Dr. Juliann Mule,  HOLD Zometa today.  Pt to call dentist and have a check up very soon.  Pt to call Dr. Boyce Medici nurse for a follow up appt after seeing her dentist  Dr. Cyndia Diver in [place].  Explanations given to pt.  Pt verbalized understanding.

## 2014-03-26 ENCOUNTER — Telehealth: Payer: Self-pay | Admitting: *Deleted

## 2014-03-26 NOTE — Telephone Encounter (Signed)
Patient called stating she wants to cancel her zometa. Patient has had some gum issues in the past and patient is to follow up with her dentist. She also states that she has not received future appointments. Called patient back to let her know that appointments one year in advance have not been made yet. Let her know to call [place] back if she does not hear about her appointment by December.

## 2014-04-03 ENCOUNTER — Ambulatory Visit: Payer: Self-pay | Admitting: Family Medicine

## 2014-04-03 LAB — CBC AND DIFFERENTIAL
HCT: 38 % (ref 36–46)
Hemoglobin: 12.9 g/dL (ref 12.0–16.0)
Platelets: 197 10*3/uL (ref 150–399)
WBC: 5.8 10^3/mL

## 2014-04-07 ENCOUNTER — Ambulatory Visit: Payer: Self-pay | Admitting: Family Medicine

## 2014-06-04 LAB — HM MAMMOGRAPHY

## 2014-07-08 ENCOUNTER — Other Ambulatory Visit: Payer: Self-pay | Admitting: Nurse Practitioner

## 2014-07-20 LAB — TSH: TSH: 1.13 u[IU]/mL (ref <=5.90)

## 2014-07-21 ENCOUNTER — Telehealth: Payer: Self-pay | Admitting: Physician Assistant

## 2014-07-21 NOTE — Telephone Encounter (Signed)
Received records from [facility] [place], [place]) for appointment with Tarri Fuller, PA on 07/22/14.  Records given to [employer] (medical records) for Byran's schedule on 07/22/14  lp

## 2014-07-22 ENCOUNTER — Encounter: Payer: Self-pay | Admitting: Physician Assistant

## 2014-07-22 ENCOUNTER — Ambulatory Visit (INDEPENDENT_AMBULATORY_CARE_PROVIDER_SITE_OTHER): Payer: Medicare Other | Admitting: Physician Assistant

## 2014-07-22 VITALS — BP 124/86 | HR 91 | Ht 65.0 in | Wt 137.9 lb

## 2014-07-22 DIAGNOSIS — I10 Essential (primary) hypertension: Secondary | ICD-10-CM

## 2014-07-22 DIAGNOSIS — R002 Palpitations: Secondary | ICD-10-CM

## 2014-07-22 MED ORDER — PROPRANOLOL HCL 40 MG PO TABS: 40.0000 mg | ORAL_TABLET | Freq: Two times a day (BID) | ORAL | Status: DC

## 2014-07-22 NOTE — Assessment & Plan Note (Signed)
Blood pressure well-controlled. May need to decrease or stop lisinopril increased propranolol

## 2014-07-22 NOTE — Patient Instructions (Signed)
Your physician recommends that you schedule a follow-up appointment in: Keep Appointment with Dr Sallyanne Kuster in December  Your physician has recommended you make the following change in your medication: Take propranolol 40 mg twice daily  If you continue to feel dizzy or lightheaded please call our office

## 2014-07-22 NOTE — Progress Notes (Signed)
Date:  07/22/2014   ID:  [name], DOB Jan 20, 1936, MRN [id number]  PCP:  Margarita Rana, MD  Primary Cardiologist:  [place]    History of Present Illness:  Brandi Hanson is a 78 y.o. female  Who was followed by Dr. Rex Kras for years for palpitations. She is tried a variety of medications for palliation of these complaints and eventually settled on propranolol which seems to cause a few his side effects. Many times when seen in the office was found to have very mild sinus tachycardia end of sustained atrial arrhythmia has been identified. Occasional ventricular bigeminy and trigeminy were seen on a Holter monitor performed in the remote past. She self-admittedly is a very anxious person and emotional situations often worsen her palpitations. Recently she has been troubled by her inability to concentrate and worsening lapses of memory. Chest had a neurological evaluation with Dr. Leonie Man who feels that her difficulties and be due to both early cognitive impairment and anxiety and suggested neuropsychological testing. MRI, EEG and blood tests have generally been unrevealing. She does not have a history of stroke or TIA. Carotid duplex ultrasonography performed in 2009 and again in 2011 showed minimal carotid plaque and no evidence of disease progression during the two-year interval.  She also has bilateral varicose veins but has never had stasis ulcers or severe symptoms of venous insufficiency. The right lower extremity has always had more prominent dilated veins in the left. She also has systemic hypertension which has been easily managed with the propranolol and a low dose of ACE inhibitor. In 2000 1221 surgery and radiation therapy for left breast cancer. She is considered to be in remission at this time.  Patient presents today with increased palpitations for the last week. She reports that last several seconds to minutes. They happen several times per day. She says she may only feels a fast  heart heart rate and some mild dizziness. Denies any presyncope shortness of breath or chest pain. She's not having any sleep deprivation.  The patient currently denies nausea, vomiting, fever, chest pain, shortness of breath, orthopnea,  PND, cough, congestion, abdominal pain, hematochezia, melena, lower extremity edema, claudication.  Wt Readings from Last 3 Encounters:  07/22/14 137 lb 14.4 oz (62.551 kg)  01/13/14 139 lb 3.2 oz (63.141 kg)  08/18/13 143 lb 3.2 oz (64.955 kg)     Past Medical History  Diagnosis Date  . Hypertension   . PVC (premature ventricular contraction)   . Hyperlipidemia   . Arthritis   . Sleep apnea   . GERD (gastroesophageal reflux disease)   . Cancer     skin  . Anxiety   . Osteoporosis   . Abdominal pain   . Abdominal distention   . Palpitations   . Generalized headaches   . Confusion   . Rectal pain   . Breast cancer in situ 07/22/2011  . Benign essential HTN 01/02/2012  . Hyperlipidemia 01/02/2012    Current Outpatient Prescriptions  Medication Sig Dispense Refill  . acetaminophen (TYLENOL) 500 MG tablet Take 500 mg by mouth every 6 (six) hours as needed.    Marland Kitchen aspirin 81 MG tablet Take 81 mg by mouth daily.      . calcium carbonate (TUMS EX) 750 MG chewable tablet Chew 1 tablet by mouth daily.    Marland Kitchen CALCIUM CITRATE-VITAMIN D PO Take by mouth daily.      . Cholecalciferol (VITAMIN D3) 1000 UNITS CAPS Take by mouth daily.      Marland Kitchen  clidinium-chlordiazePOXIDE (LIBRAX) 5-2.5 MG per capsule Take 1 capsule by mouth 2 (two) times daily.    . Coenzyme Q10 (COQ10) 100 MG CAPS Take by mouth daily.      Marland Kitchen letrozole (FEMARA) 2.5 MG tablet Take 1 tablet (2.5 mg total) by mouth daily. 90 tablet 3  . lisinopril (PRINIVIL,ZESTRIL) 5 MG tablet Take 1 tablet (5 mg total) by mouth daily. 90 tablet 3  . meloxicam (MOBIC) 7.5 MG tablet Take 2 tablets by mouth daily.    . Multiple Vitamin (STRESS B) TABS Take by mouth daily.      . potassium citrate (UROCIT-K) 10 MEQ  (1080 MG) SR tablet Take 10 mEq by mouth 2 (two) times daily.     . propranolol (INDERAL) 40 MG tablet Take 1 tablet (40 mg total) by mouth 2 (two) times daily. 60 tablet 5  . rosuvastatin (CRESTOR) 5 MG tablet Take 5 mg by mouth 3 (three) times a week.    . Sennosides (SENNA LAX PO) Take 1 tablet by mouth as needed.     No current facility-administered medications for this visit.    Allergies:    Allergies  Allergen Reactions  . Sulfa Antibiotics Nausea Only    Social History:  The patient  reports that she has never smoked. She has never used smokeless tobacco. She reports that she does not drink alcohol or use illicit drugs.   Family history:   Family History  Problem Relation Age of Onset  . Cancer Mother     breast  . Cancer Father     lung  . Cancer Sister     breast and uterine    ROS:  Please see the history of present illness.  All other systems reviewed and negative.   PHYSICAL EXAM: VS:  BP 124/86 mmHg  Pulse 91  Ht 5\' 5"  (1.651 m)  Wt 137 lb 14.4 oz (62.551 kg)  BMI 22.95 kg/m2 Well nourished, well developed, in no acute distress HEENT: Pupils are equal round react to light accommodation extraocular movements are intact.  Neck: no JVDNo cervical lymphadenopathy. Cardiac: Regular rate and rhythm without murmurs rubs or gallops. Lungs:  clear to auscultation bilaterally, no wheezing, rhonchi or rales Abd: soft, nontender, positive bowel sounds all quadrants, no hepatosplenomegaly Ext: no lower extremity edema.  2+ radial and dorsalis pedis pulses. Skin: warm and dry Neuro:  Grossly normal  EKG:  Normal sinus rhythm rate 91 bpm    ASSESSMENT AND PLAN:  Problem List Items Addressed This Visit    Benign essential HTN    Blood pressure well-controlled. May need to decrease or stop lisinopril increased propranolol    Relevant Medications      propranolol (INDERAL) IR tablet   Palpitations - Primary    Patient has noticed increased palpitations for the last  week. They only lasts seconds to minutes but happens several times per day. Due to her memory history is somewhat difficult to obtain. She was here with her sister. Going to increase her propranolol from 60 mg daily to 40 mg twice daily.  She'll follow-up in a month    Relevant Orders      EKG 12-Lead

## 2014-07-22 NOTE — Assessment & Plan Note (Signed)
Patient has noticed increased palpitations for the last week. They only lasts seconds to minutes but happens several times per day. Due to her memory history is somewhat difficult to obtain. She was here with her sister. Going to increase her propranolol from 60 mg daily to 40 mg twice daily.  She'll follow-up in a month

## 2014-08-10 ENCOUNTER — Encounter: Payer: Self-pay | Admitting: Cardiovascular Disease

## 2014-08-19 ENCOUNTER — Ambulatory Visit (INDEPENDENT_AMBULATORY_CARE_PROVIDER_SITE_OTHER): Payer: Medicare Other | Admitting: Cardiovascular Disease

## 2014-08-19 ENCOUNTER — Encounter: Payer: Self-pay | Admitting: Cardiovascular Disease

## 2014-08-19 VITALS — BP 134/80 | HR 84 | Ht 66.0 in | Wt 138.9 lb

## 2014-08-19 DIAGNOSIS — R002 Palpitations: Secondary | ICD-10-CM

## 2014-08-19 DIAGNOSIS — I10 Essential (primary) hypertension: Secondary | ICD-10-CM

## 2014-08-19 NOTE — Patient Instructions (Signed)
Your physician has recommended you make the following change in your medication: Continue same medications.  If you have increased palpitations in the middle of the day you can take an extra 40mg  of propranolol.  Dr. Sallyanne Kuster recommends that you schedule a follow-up appointment in: one year.

## 2014-08-20 NOTE — Progress Notes (Signed)
Patient ID: Brandi Hanson, female   DOB: March 23, 1936, 78 y.o.   MRN: [id number]      Reason for office visit Palpitations.  Nahiara has occasional palpitations which appear to be related to occasional PVCs (seen on arrhythmia monitor) in the absence of significant structural heart disease. She has early cognitive impairment. She has well controlled HBP. She saw Tarri Fuller in the office last month for increased palpitations, which have now subsided. Her memory difficulties makes it a little difficult to assess the symptom burden.  Allergies  Allergen Reactions  . Sulfa Antibiotics Nausea Only    Current Outpatient Prescriptions  Medication Sig Dispense Refill  . acetaminophen (TYLENOL) 500 MG tablet Take 500 mg by mouth every 6 (six) hours as needed.    Marland Kitchen aspirin 81 MG tablet Take 81 mg by mouth daily.      . calcium carbonate (TUMS EX) 750 MG chewable tablet Chew 1 tablet by mouth daily.    Marland Kitchen CALCIUM CITRATE-VITAMIN D PO Take by mouth daily.      . Cholecalciferol (VITAMIN D3) 1000 UNITS CAPS Take by mouth daily.      . clidinium-chlordiazePOXIDE (LIBRAX) 5-2.5 MG per capsule Take 1 capsule by mouth 2 (two) times daily.    . Coenzyme Q10 (COQ10) 100 MG CAPS Take by mouth daily.      Marland Kitchen letrozole (FEMARA) 2.5 MG tablet Take 1 tablet (2.5 mg total) by mouth daily. 90 tablet 3  . lisinopril (PRINIVIL,ZESTRIL) 5 MG tablet Take 1 tablet (5 mg total) by mouth daily. 90 tablet 3  . meloxicam (MOBIC) 7.5 MG tablet Take 2 tablets by mouth daily.    . Multiple Vitamin (STRESS B) TABS Take by mouth daily.      . Phosphatidylserine-DHA-EPA (VAYACOG PO) Take 1 capsule by mouth daily.    . potassium citrate (UROCIT-K) 10 MEQ (1080 MG) SR tablet Take 10 mEq by mouth 2 (two) times daily.     . propranolol (INDERAL) 40 MG tablet Take 1 tablet (40 mg total) by mouth 2 (two) times daily. 60 tablet 5  . rosuvastatin (CRESTOR) 5 MG tablet Take 5 mg by mouth 3 (three) times a week.    . Sennosides (SENNA LAX  PO) Take 1 tablet by mouth as needed.     No current facility-administered medications for this visit.    Past Medical History  Diagnosis Date  . Hypertension   . PVC (premature ventricular contraction)   . Hyperlipidemia   . Arthritis   . Sleep apnea   . GERD (gastroesophageal reflux disease)   . Cancer     skin  . Anxiety   . Osteoporosis   . Abdominal pain   . Abdominal distention   . Palpitations   . Generalized headaches   . Confusion   . Rectal pain   . Breast cancer in situ 07/22/2011  . Benign essential HTN 01/02/2012  . Hyperlipidemia 01/02/2012    Past Surgical History  Procedure Laterality Date  . Abdominal hysterectomy  1978  . Breast biopsy  1990, 1994    left  . Foot surgery  2005  . Rotator cuff repair  2007  . Cataract extraction  2011    bilateral  . Kidney stone surgery  2011    Family History  Problem Relation Age of Onset  . Cancer Mother     breast  . Cancer Father     lung  . Cancer Sister     breast and uterine  History   Social History  . Marital Status: Single    Spouse Name: N/A    Number of Children: N/A  . Years of Education: N/A   Occupational History  . Not on file.   Social History Main Topics  . Smoking status: Never Smoker   . Smokeless tobacco: Never Used  . Alcohol Use: No  . Drug Use: No  . Sexual Activity: Not on file   Other Topics Concern  . Not on file   Social History Narrative    Review of systems: The patient specifically denies any chest pain at rest or with exertion, dyspnea at rest or with exertion, orthopnea, paroxysmal nocturnal dyspnea, syncope,, focal neurological deficits, intermittent claudication, lower extremity edema, unexplained weight gain, cough, hemoptysis or wheezing.  The patient also denies abdominal pain, nausea, vomiting, dysphagia, diarrhea, constipation, polyuria, polydipsia, dysuria, hematuria, frequency, urgency, abnormal bleeding or bruising, fever, chills, unexpected  weight changes, mood swings, change in skin or hair texture, change in voice quality, auditory or visual problems, allergic reactions or rashes, new musculoskeletal complaints other than usual "aches and pains".   PHYSICAL EXAM BP 134/80 mmHg  Pulse 84  Ht 5' 6"  (1.676 m)  Wt 138 lb 14.4 oz (63.005 kg)  BMI 22.43 kg/m2  General: Alert, oriented x3, no distress Head: no evidence of trauma, PERRL, EOMI, no exophtalmos or lid lag, no myxedema, no xanthelasma; normal ears, nose and oropharynx Neck: normal jugular venous pulsations and no hepatojugular reflux; brisk carotid pulses without delay and no carotid bruits Chest: clear to auscultation, no signs of consolidation by percussion or palpation, normal fremitus, symmetrical and full respiratory excursions Cardiovascular: normal position and quality of the apical impulse, regular rhythm, normal first and second heart sounds, no murmurs, rubs or gallops Abdomen: no tenderness or distention, no masses by palpation, no abnormal pulsatility or arterial bruits, normal bowel sounds, no hepatosplenomegaly Extremities: no clubbing, cyanosis or edema; 2+ radial, ulnar and brachial pulses bilaterally; 2+ right femoral, posterior tibial and dorsalis pedis pulses; 2+ left femoral, posterior tibial and dorsalis pedis pulses; no subclavian or femoral bruits Neurological: grossly nonfocal   EKG: NSR  Lipid Panel  No results found for: CHOL, TRIG, HDL, CHOLHDL, VLDL, LDLCALC, LDLDIRECT  BMET    Component Value Date/Time   NA 141 01/13/2014 1410   NA 137 04/09/2012 1439   K 4.3 01/13/2014 1410   K 4.1 04/09/2012 1439   CL 105 12/31/2012 1032   CL 99 04/09/2012 1439   CO2 29 01/13/2014 1410   CO2 31 04/09/2012 1439   GLUCOSE 106 01/13/2014 1410   GLUCOSE 101* 12/31/2012 1032   GLUCOSE 121* 04/09/2012 1439   BUN 20.2 01/13/2014 1410   BUN 13 04/09/2012 1439   CREATININE 0.8 01/13/2014 1410   CREATININE 0.7 04/09/2012 1439   CALCIUM 9.9  01/13/2014 1410   CALCIUM 9.4 04/09/2012 1439   GFRNONAA >60 10/17/2010 1444   GFRAA  10/17/2010 1444    >60        The eGFR has been calculated using the MDRD equation. This calculation has not been validated in all clinical situations. eGFR's persistently <60 mL/min signify possible Chronic Kidney Disease.     ASSESSMENT AND PLAN  Samyah feels better. Increased palpitations may have been related to an upper respiratory infection and have since subsided. She can take an extra 40 mg of propranolol mid-day if needed for persistent palpitations.  Patient Instructions  Your physician has recommended you make the following change in  your medication: Continue same medications.  If you have increased palpitations in the middle of the day you can take an extra 1m of propranolol.  Dr. CSallyanne Kusterrecommends that you schedule a follow-up appointment in: one year.        Meds ordered this encounter  Medications  . Phosphatidylserine-DHA-EPA (VAYACOG PO)    Sig: Take 1 capsule by mouth daily.    CHolli Humbles MD, [place]([phone] [phone]

## 2014-10-05 ENCOUNTER — Other Ambulatory Visit: Payer: Self-pay

## 2014-10-05 DIAGNOSIS — R0781 Pleurodynia: Secondary | ICD-10-CM

## 2014-10-05 MED ORDER — LISINOPRIL 5 MG PO TABS: 5.0000 mg | ORAL_TABLET | Freq: Every day | ORAL | Status: DC

## 2014-10-05 NOTE — Telephone Encounter (Signed)
Rx sent to pharmacy   

## 2014-10-29 LAB — LIPID PANEL
Cholesterol: 174 mg/dL (ref 0–200)
HDL: 52 mg/dL (ref 35–70)
LDL Cholesterol: 98 mg/dL
Triglycerides: 119 mg/dL (ref 40–160)

## 2014-10-29 LAB — BASIC METABOLIC PANEL
BUN: 15 mg/dL (ref 4–21)
CREATININE: 0.6 mg/dL (ref 0.5–1.1)
GLUCOSE: 95 mg/dL
Potassium: 4.2 mmol/L (ref 3.4–5.3)
Sodium: 142 mmol/L (ref 137–147)

## 2014-10-29 LAB — HEPATIC FUNCTION PANEL
ALT: 13 U/L (ref 7–35)
AST: 15 U/L (ref 13–35)

## 2014-11-10 ENCOUNTER — Telehealth: Payer: Self-pay | Admitting: Neurology

## 2014-11-10 NOTE — Telephone Encounter (Signed)
Pt is calling to make sure Dr. Leonie Man got office notes from Dr. Marlane Hatcher.  Please call back to let pt know if notes were received.

## 2014-11-10 NOTE — Telephone Encounter (Signed)
Dr Leonie Man did you receive these notes yet, please advise

## 2014-11-12 NOTE — Telephone Encounter (Signed)
Spoke with patient and informed her that notes were not received, called Dr Georges Mouse office and could never get an answer will try again later.

## 2014-11-12 NOTE — Telephone Encounter (Signed)
No

## 2014-11-17 LAB — HM DEXA SCAN

## 2014-12-11 ENCOUNTER — Telehealth: Payer: Self-pay | Admitting: Hematology and Oncology

## 2014-12-11 NOTE — Telephone Encounter (Signed)
Called and left a message with new dr gudena appointment °

## 2015-01-08 DIAGNOSIS — Z6823 Body mass index (BMI) 23.0-23.9, adult: Secondary | ICD-10-CM | POA: Insufficient documentation

## 2015-01-08 DIAGNOSIS — F329 Major depressive disorder, single episode, unspecified: Secondary | ICD-10-CM | POA: Insufficient documentation

## 2015-01-08 DIAGNOSIS — K5909 Other constipation: Secondary | ICD-10-CM | POA: Insufficient documentation

## 2015-01-08 DIAGNOSIS — C50412 Malignant neoplasm of upper-outer quadrant of left female breast: Secondary | ICD-10-CM | POA: Insufficient documentation

## 2015-01-08 DIAGNOSIS — Z9181 History of falling: Secondary | ICD-10-CM | POA: Insufficient documentation

## 2015-01-08 DIAGNOSIS — B0229 Other postherpetic nervous system involvement: Secondary | ICD-10-CM | POA: Insufficient documentation

## 2015-01-08 DIAGNOSIS — Z853 Personal history of malignant neoplasm of breast: Secondary | ICD-10-CM | POA: Insufficient documentation

## 2015-01-08 DIAGNOSIS — F32A Depression, unspecified: Secondary | ICD-10-CM | POA: Insufficient documentation

## 2015-01-08 DIAGNOSIS — G4730 Sleep apnea, unspecified: Secondary | ICD-10-CM | POA: Insufficient documentation

## 2015-01-14 ENCOUNTER — Other Ambulatory Visit: Payer: Self-pay

## 2015-01-14 DIAGNOSIS — C50919 Malignant neoplasm of unspecified site of unspecified female breast: Secondary | ICD-10-CM

## 2015-01-15 ENCOUNTER — Other Ambulatory Visit (HOSPITAL_BASED_OUTPATIENT_CLINIC_OR_DEPARTMENT_OTHER): Payer: Medicare Other

## 2015-01-15 ENCOUNTER — Telehealth: Payer: Self-pay | Admitting: Hematology and Oncology

## 2015-01-15 ENCOUNTER — Ambulatory Visit (HOSPITAL_BASED_OUTPATIENT_CLINIC_OR_DEPARTMENT_OTHER): Payer: Medicare Other | Admitting: Hematology and Oncology

## 2015-01-15 ENCOUNTER — Ambulatory Visit (HOSPITAL_BASED_OUTPATIENT_CLINIC_OR_DEPARTMENT_OTHER): Payer: Medicare Other

## 2015-01-15 VITALS — BP 149/85 | HR 85 | Temp 98.0°F | Resp 18 | Ht 66.0 in | Wt 140.5 lb

## 2015-01-15 DIAGNOSIS — N390 Urinary tract infection, site not specified: Secondary | ICD-10-CM

## 2015-01-15 DIAGNOSIS — M859 Disorder of bone density and structure, unspecified: Secondary | ICD-10-CM

## 2015-01-15 DIAGNOSIS — C50919 Malignant neoplasm of unspecified site of unspecified female breast: Secondary | ICD-10-CM

## 2015-01-15 DIAGNOSIS — D0512 Intraductal carcinoma in situ of left breast: Secondary | ICD-10-CM

## 2015-01-15 DIAGNOSIS — C50912 Malignant neoplasm of unspecified site of left female breast: Secondary | ICD-10-CM

## 2015-01-15 LAB — URINALYSIS, MICROSCOPIC - CHCC
Bilirubin (Urine): NEGATIVE
Glucose: NEGATIVE mg/dL
Ketones: NEGATIVE mg/dL
NITRITE: NEGATIVE
Protein: NEGATIVE mg/dL
SPECIFIC GRAVITY, URINE: 1.005 (ref 1.003–1.035)
UROBILINOGEN UR: 0.2 mg/dL (ref 0.2–1)
pH: 7.5 (ref 4.6–8.0)

## 2015-01-15 LAB — COMPREHENSIVE METABOLIC PANEL (CC13)
ALT: 15 U/L (ref 0–55)
AST: 14 U/L (ref 5–34)
Albumin: 3.8 g/dL (ref 3.5–5.0)
Alkaline Phosphatase: 82 U/L (ref 40–150)
Anion Gap: 12 mEq/L — ABNORMAL HIGH (ref 3–11)
BILIRUBIN TOTAL: 0.66 mg/dL (ref 0.20–1.20)
BUN: 26.1 mg/dL — ABNORMAL HIGH (ref 7.0–26.0)
CALCIUM: 9.5 mg/dL (ref 8.4–10.4)
CO2: 24 mEq/L (ref 22–29)
CREATININE: 0.7 mg/dL (ref 0.6–1.1)
Chloride: 106 mEq/L (ref 98–109)
EGFR: 84 mL/min/{1.73_m2} — AB (ref >90)
Glucose: 103 mg/dl (ref 70–140)
Potassium: 4.2 mEq/L (ref 3.5–5.1)
Sodium: 142 mEq/L (ref 136–145)
Total Protein: 6.8 g/dL (ref 6.4–8.3)

## 2015-01-15 LAB — CBC WITH DIFFERENTIAL/PLATELET
BASO%: 0.6 % (ref 0.0–2.0)
Basophils Absolute: 0.1 10*3/uL (ref 0.0–0.1)
EOS%: 0.6 % (ref 0.0–7.0)
Eosinophils Absolute: 0.1 10*3/uL (ref 0.0–0.5)
HEMATOCRIT: 36.8 % (ref 34.8–46.6)
HGB: 12.4 g/dL (ref 11.6–15.9)
LYMPH%: 7.2 % — ABNORMAL LOW (ref 14.0–49.7)
MCH: 29.8 pg (ref 25.1–34.0)
MCHC: 33.8 g/dL (ref 31.5–36.0)
MCV: 88 fL (ref 79.5–101.0)
MONO#: 0.9 10*3/uL (ref 0.1–0.9)
MONO%: 8.5 % (ref 0.0–14.0)
NEUT#: 8.7 10*3/uL — ABNORMAL HIGH (ref 1.5–6.5)
NEUT%: 83.1 % — AB (ref 38.4–76.8)
Platelets: 170 10*3/uL (ref 145–400)
RBC: 4.18 10*6/uL (ref 3.70–5.45)
RDW: 12.9 % (ref 11.2–14.5)
WBC: 10.4 10*3/uL — ABNORMAL HIGH (ref 3.9–10.3)
lymph#: 0.7 10*3/uL — ABNORMAL LOW (ref 0.9–3.3)

## 2015-01-15 NOTE — Assessment & Plan Note (Signed)
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 Breast exam 01/15/2015: Normal 2. Mammogram September 2015 No abnormalities. Postsurgical changes. Breast Density Category . I recommended that she get 3-D mammograms for surveillance. Discussed the differences between different breast density categories.  Hypertension: Follow-up with primary care Osteopenia: DEXA scan March 2016  Return to clinic in 1 year for follow-up

## 2015-01-15 NOTE — Progress Notes (Signed)
Patient Care Team: Margarita Rana, MD as PCP - General (Family Medicine)  DIAGNOSIS: No matching staging information was found for the patient.  SUMMARY OF ONCOLOGIC HISTORY:   Breast cancer, left breast   10/19/2010 Surgery Left breast lumpectomy: High-grade DCIS 1.5 cm, necrosis present, ER 95%, PR 0%    CHIEF COMPLIANT: Follow-up of DCIS left breast  INTERVAL HISTORY: Brandi Hanson is a 79 year old lady with above-mentioned history of left breast DCIS currently on hormonal therapy with letrozole and tolerating it extremely well without any major problems or concerns. She did have osteopenia. Does not have any lumps or nodules in the breasts. September 2015 mammograms were normal.  REVIEW OF SYSTEMS:   Constitutional: Denies fevers, chills or abnormal weight loss Eyes: Denies blurriness of vision Ears, nose, mouth, throat, and face: Denies mucositis or sore throat Respiratory: Denies cough, dyspnea or wheezes Cardiovascular: Denies palpitation, chest discomfort or lower extremity swelling Gastrointestinal:  Denies nausea, heartburn or change in bowel habits Skin: Denies abnormal skin rashes Lymphatics: Denies new lymphadenopathy or easy bruising Neurological:Denies numbness, tingling or new weaknesses Behavioral/Psych: Mood is stable, no new changes  Breast:  denies any pain or lumps or nodules in either breasts All other systems were reviewed with the patient and are negative.  I have reviewed the past medical history, past surgical history, social history and family history with the patient and they are unchanged from previous note.  ALLERGIES:  is allergic to sulfa antibiotics.  MEDICATIONS:  Current Outpatient Prescriptions  Medication Sig Dispense Refill  . acetaminophen (TYLENOL) 500 MG tablet Take 500 mg by mouth every 6 (six) hours as needed.    Marland Kitchen aspirin 81 MG tablet Take 81 mg by mouth daily.      Marland Kitchen aspirin 81 MG tablet ASPIRIN EC LOW DOSE, 81MG  (Oral Tablet Delayed  Release)  1 Every Day for 0 days  Quantity: 0.00;  Refills: 0   Ordered :12-May-2010  Abran Richard ;  Started 05-Feb-2009 Active Comments: DX: 272.0    . calcium carbonate (TUMS - DOSED IN MG ELEMENTAL CALCIUM) 500 MG chewable tablet Chew by mouth.    . calcium carbonate (TUMS EX) 750 MG chewable tablet Chew 1 tablet by mouth daily.    . Calcium Carbonate-Vitamin D 600-200 MG-UNIT TABS CALCIUM + D, 600-200MG -UNIT (Oral Tablet)  1 Twice Daily for 0 days  Quantity: 0.00;  Refills: 0   Ordered :12-May-2010  Abran Richard ;  Started 05-Feb-2009 Active Comments: DX: 733.90    . CALCIUM CITRATE-VITAMIN D PO Take by mouth daily.      . Cholecalciferol (VITAMIN D3) 1000 UNITS CAPS Take by mouth daily.      . Cholecalciferol (VITAMIN D3) 1000 UNITS CAPS Take by mouth.    . clidinium-chlordiazePOXIDE (LIBRAX) 5-2.5 MG per capsule Take 1 capsule by mouth 2 (two) times daily.    . clidinium-chlordiazePOXIDE (LIBRAX) 5-2.5 MG per capsule Take by mouth.    . Coenzyme Q-10 100 MG capsule Take by mouth.    . Coenzyme Q10 (COQ10) 100 MG CAPS Take by mouth daily.      . diphenhydramine-acetaminophen (TYLENOL PM) 25-500 MG TABS TYLENOL PM EXTRA STRENGTH, 500-25MG  (Oral Tablet)  2 Every Day, As Needed for 0 days  Quantity: 0.00;  Refills: 0   Ordered :12-May-2010  Doy Hutching ;  Started 28-Jul-2009 Active Comments: Medication taken as needed. DX: 627.8    . donepezil (ARICEPT) 5 MG tablet Take by mouth.    . letrozole (FEMARA) 2.5 MG tablet  Take 1 tablet (2.5 mg total) by mouth daily. 90 tablet 3  . letrozole (FEMARA) 2.5 MG tablet Take by mouth.    Marland Kitchen lisinopril (PRINIVIL,ZESTRIL) 5 MG tablet Take 1 tablet (5 mg total) by mouth daily. 90 tablet 3  . lisinopril (PRINIVIL,ZESTRIL) 5 MG tablet Take by mouth.    . meloxicam (MOBIC) 7.5 MG tablet Take 2 tablets by mouth daily.    . meloxicam (MOBIC) 7.5 MG tablet Take by mouth.    . Multiple Vitamin (STRESS B) TABS Take by mouth daily.      . Phosphatidylserine-DHA-EPA  (VAYACOG PO) Take 1 capsule by mouth daily.    . Phosphatidylserine-DHA-EPA (VAYACOG PO) VAYACOG - Historical Medication  1 Daily Active    . potassium citrate (UROCIT-K) 10 MEQ (1080 MG) SR tablet Take 10 mEq by mouth 2 (two) times daily.     . potassium citrate (UROCIT-K) 10 MEQ (1080 MG) SR tablet Take by mouth.    . propranolol (INDERAL) 20 MG tablet Take by mouth.    . propranolol (INDERAL) 40 MG tablet Take 1 tablet (40 mg total) by mouth 2 (two) times daily. 60 tablet 5  . rosuvastatin (CRESTOR) 5 MG tablet Take 5 mg by mouth 3 (three) times a week.    . rosuvastatin (CRESTOR) 5 MG tablet Take by mouth.    . senna (SENOKOT) 8.6 MG TABS tablet Take 1 tablet by mouth 2 (two) times daily.    . Sennosides (SENNA LAX PO) Take 1 tablet by mouth as needed.     No current facility-administered medications for this visit.    PHYSICAL EXAMINATION: ECOG PERFORMANCE STATUS: 0 - Asymptomatic  Filed Vitals:   01/15/15 1051  BP: 149/85  Pulse: 85  Temp: 98 F (36.7 C)  Resp: 18   Filed Weights   01/15/15 1051  Weight: 140 lb 8 oz (63.73 kg)    GENERAL:alert, no distress and comfortable SKIN: skin color, texture, turgor are normal, no rashes or significant lesions EYES: normal, Conjunctiva are pink and non-injected, sclera clear OROPHARYNX:no exudate, no erythema and lips, buccal mucosa, and tongue normal  NECK: supple, thyroid normal size, non-tender, without nodularity LYMPH:  no palpable lymphadenopathy in the cervical, axillary or inguinal LUNGS: clear to auscultation and percussion with normal breathing effort HEART: regular rate & rhythm and no murmurs and no lower extremity edema ABDOMEN:abdomen soft, non-tender and normal bowel sounds Musculoskeletal:no cyanosis of digits and no clubbing  NEURO: alert & oriented x 3 with fluent speech, no focal motor/sensory deficits BREAST: I told her we will find her blood No palpable masses or nodules in either right or left breasts. No  palpable axillary supraclavicular or infraclavicular adenopathy no breast tenderness or nipple discharge. (exam performed in the presence of a chaperone)  LABORATORY DATA:  I have reviewed the data as listed   Chemistry      Component Value Date/Time   NA 142 01/15/2015 1030   NA 142 10/29/2014   NA 137 07/02/2013 1525   NA 137 04/09/2012 1439   K 4.2 01/15/2015 1030   K 4.2 10/29/2014   K 4.9 07/02/2013 1525   CL 105 07/02/2013 1525   CL 105 12/31/2012 1032   CL 99 04/09/2012 1439   CO2 24 01/15/2015 1030   CO2 29 07/02/2013 1525   CO2 31 04/09/2012 1439   BUN 26.1* 01/15/2015 1030   BUN 15 10/29/2014   BUN 18 07/02/2013 1525   BUN 13 04/09/2012 1439   CREATININE 0.7  01/15/2015 1030   CREATININE 0.6 10/29/2014   CREATININE 0.77 07/02/2013 1525   CREATININE 0.7 04/09/2012 1439   GLU 95 10/29/2014      Component Value Date/Time   CALCIUM 9.5 01/15/2015 1030   CALCIUM 9.5 07/02/2013 1525   CALCIUM 9.4 04/09/2012 1439   ALKPHOS 82 01/15/2015 1030   ALKPHOS 78 07/02/2013 1525   ALKPHOS 63 04/09/2012 1439   AST 14 01/15/2015 1030   AST 15 10/29/2014   AST 29 07/02/2013 1525   ALT 15 01/15/2015 1030   ALT 13 10/29/2014   ALT 24 07/02/2013 1525   BILITOT 0.66 01/15/2015 1030   BILITOT 0.4 04/09/2012 1439       Lab Results  Component Value Date   WBC 10.4* 01/15/2015   HGB 12.4 01/15/2015   HCT 36.8 01/15/2015   MCV 88.0 01/15/2015   PLT 170 01/15/2015   NEUTROABS 8.7* 01/15/2015     RADIOGRAPHIC STUDIES: I have personally reviewed the radiology reports and agreed with their findings. 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 Breast exam 01/15/2015: Normal 2. Mammogram September 2015 No  abnormalities. Postsurgical changes. Breast Density Category . I recommended that she get 3-D mammograms for surveillance. Discussed the differences between different breast density categories.  Hypertension: Follow-up with primary care Osteopenia: DEXA scan March 2016 UTI: We will get urinalysis and call her with the results of this test if she needs to go on antibiotics for UTI.  Return to clinic in 1 year for follow-up    Orders Placed This Encounter  Procedures  . Urine culture    Standing Status: Future     Number of Occurrences:      Standing Expiration Date: 01/15/2016  . Urinalysis, Microscopic - [facility]    Standing Status: Future     Number of Occurrences:      Standing Expiration Date: 01/15/2016   The patient has a good understanding of the overall plan. she agrees with it. she will call with any problems that may develop before the next visit here.   Rulon Eisenmenger, MD

## 2015-01-15 NOTE — Telephone Encounter (Signed)
Appointments made and avs printed for patient °

## 2015-01-18 ENCOUNTER — Other Ambulatory Visit: Payer: Self-pay | Admitting: *Deleted

## 2015-01-18 ENCOUNTER — Ambulatory Visit: Payer: Medicare Other

## 2015-01-18 ENCOUNTER — Other Ambulatory Visit: Payer: Medicare Other

## 2015-01-18 LAB — URINE CULTURE

## 2015-01-18 MED ORDER — PROPRANOLOL HCL 20 MG PO TABS: 40.0000 mg | ORAL_TABLET | Freq: Two times a day (BID) | ORAL | Status: DC

## 2015-01-19 ENCOUNTER — Other Ambulatory Visit: Payer: Self-pay | Admitting: *Deleted

## 2015-01-19 ENCOUNTER — Telehealth: Payer: Self-pay | Admitting: *Deleted

## 2015-01-19 NOTE — Telephone Encounter (Signed)
VM message from patient requesting results of recent urine culture (01/15/15) and any possible treatment needed.

## 2015-01-19 NOTE — Telephone Encounter (Signed)
Culture negative. Left VMM for patient to this effect.

## 2015-02-04 ENCOUNTER — Ambulatory Visit (INDEPENDENT_AMBULATORY_CARE_PROVIDER_SITE_OTHER): Payer: Medicare Other | Admitting: Neurology

## 2015-02-04 ENCOUNTER — Encounter: Payer: Self-pay | Admitting: Neurology

## 2015-02-04 VITALS — BP 124/76 | HR 86

## 2015-02-04 DIAGNOSIS — G3184 Mild cognitive impairment, so stated: Secondary | ICD-10-CM

## 2015-02-04 MED ORDER — DONEPEZIL HCL 5 MG PO TABS: 10.0000 mg | ORAL_TABLET | Freq: Every day | ORAL | Status: DC

## 2015-02-04 NOTE — Patient Instructions (Addendum)
I had a long discussion with the patient and her sister regarding her mild cognitive impairment and memory difficulties. I have reviewed results of neuropsych testing and discuss findings with them. I agree with Aricept but increase the dose to 10 mg daily if tolerated. I have discussed possible side effects with the patient and advised her to call me. She was also encouraged to participate in activities for cognitive challenge like solving crossword puzzles, sudoku and playing bridge. I advised the patient to limit her driving to daytime hours and to familiar roads. She was asked to keep her appointment with her therapist and continue treatment for anxiety. She may possibly consider participation in the [place] study in the future if interested. Return for follow-up in 2 months

## 2015-02-04 NOTE — Progress Notes (Signed)
[facility] [address] [place]. [place] [address] [phone]       OFFICE FOLLOW UP NOTE  [name] [facility] Date of Birth:  05-May-1936 Medical Record Number:  [id number]   Referring MD:  Brandi Hanson  Reason for Referral:  Memory loss  HPI: 89 year Caucasian lady  having mild progressive memory and cognitive difficulties for last 2 years which have been mildly progressive.She forgets  recent  Events, tasks and at times struggles to find words and finish sentences stopping in midsentence or occcasionally even stuttering..She misplaces things.at times she has been noticed as staring blankly into space and transiently unaware of her surroundings.She has longstanding h/o anxiety and has been on Librax 2.5 mg twice daily  For years and Inderal LA 60 mg has been added recently.She is yet independent in her ADLs and lives alone. There is no h/o headache, seizure, TIA, stroke, significant head injury with loss of consciousness.She has not had any brain imaging or lab work for causes of memory loss or detailed neuropsych testing. Update 08/12/13 : She returns for followup today and states that she did not start taking the fish oil as I had instructed and took only  the samples of Cerefolin I gave her and when she ran out she did not fill the prescription. She states her memory difficulties the about the same and anxiety seems to be increased. She did undergo the tests which I had ordered however. Vitamin B12, TSH and RPR on 06/09/13 were all normal. EEG done and 06/19/13 was normal without any epileptiform features. MRI scan of the brain on 06/20/13 showed mild changes of chronic microvascular ischemia and generalized cerebral atrophy. She admits that she is quite anxious and she has been taking Lipper ask and Inderal for a long time and has seen only her primary physician and perhaps may benefit by referral to a psychiatrist. She did not keep the appointment which we had made  [facility] as she prefers Dr Brandi Hanson in [place] which is shorter drive for her. Update 02/04/2015 : She returns for follow-up today after last visit in December 2014. She is accompanied by sister. She continues to have cognitive difficulties mainly with short-term memory, multitasking, concentration. These subjectively seems to be worse but on objective testing today she actually did not score significantly less than last visit. She however did undergo detailed neuropsych testing on 10/22/14 by Dr. 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 Compared with similar testing by her in 2015 there appeared to be slight worsening. She was started on Aricept 5 mg daily 3 months ago by her primary physician she is tolerating it well without any GI or CNS side effects but the patient feels she is not subjectively any better. She was found to have difficulty with complex visual processing and advised to not drive or limit her driving. The patient informs me that she's not had any problems with driving and on my neurological exam she has full fields of vision and no major neurological deficits. She saw a psychiatrist in [place] but was not happy with him as he did not change her medications. ROS:   14 system review of systems is positive for eye  itching, hearing loss, ringing in the ears, runny nose, choking, palpitations, flushing, abdominal pain, constipation, frequent waking, daytime sleepiness, blood in urine, joint and back pain, aching muscles, walking difficulty, decreased concentration, agitation,  depression, nervousness, anxiety, speech difficulty, numbness, memory loss and easy bruising.  PMH:  Past Medical History  Diagnosis Date  . Hypertension   . PVC (premature ventricular contraction)   . Hyperlipidemia   . Arthritis   . Sleep apnea   . GERD (gastroesophageal reflux disease)     . Cancer     skin  . Anxiety   . Osteoporosis   . Abdominal pain   . Abdominal distention   . Palpitations   . Generalized headaches   . Confusion   . Rectal pain   . Breast cancer in situ 07/22/2011  . Benign essential HTN 01/02/2012  . Hyperlipidemia 01/02/2012  . Memory loss   . Depression     Social History:  History   Social History  . Marital Status: Single    Spouse Name: N/A  . Number of Children: N/A  . Years of Education: N/A   Occupational History  . Not on file.   Social History Main Topics  . Smoking status: Never Smoker   . Smokeless tobacco: Never Used  . Alcohol Use: No  . Drug Use: No  . Sexual Activity: Not on file   Other Topics Concern  . Not on file   Social History Narrative    Medications:   Current Outpatient Prescriptions on File Prior to Visit  Medication Sig Dispense Refill  . acetaminophen (TYLENOL) 500 MG tablet Take 500 mg by mouth every 6 (six) hours as needed.    Marland Kitchen aspirin 81 MG tablet ASPIRIN EC LOW DOSE, 81MG  (Oral Tablet Delayed Release)  1 Every Day for 0 days  Quantity: 0.00;  Refills: 0   Ordered :12-May-2010  Abran Richard ;  Started 05-Feb-2009 Active Comments: DX: 272.0    . calcium carbonate (TUMS - DOSED IN MG ELEMENTAL CALCIUM) 500 MG chewable tablet Chew by mouth.    . Calcium Carbonate-Vitamin D 600-200 MG-UNIT TABS CALCIUM + D, 600-200MG -UNIT (Oral Tablet)  1 Twice Daily for 0 days  Quantity: 0.00;  Refills: 0   Ordered :12-May-2010  Abran Richard ;  Started 05-Feb-2009 Active Comments: DX: 733.90    . Cholecalciferol (VITAMIN D3) 1000 UNITS CAPS Take by mouth.    . clidinium-chlordiazePOXIDE (LIBRAX) 5-2.5 MG per capsule Take by mouth.    . letrozole (FEMARA) 2.5 MG tablet Take by mouth.    Marland Kitchen lisinopril (PRINIVIL,ZESTRIL) 5 MG tablet Take 1 tablet (5 mg total) by mouth daily. 90 tablet 3  . Multiple Vitamin (STRESS B) TABS Take by mouth daily.      . Phosphatidylserine-DHA-EPA (VAYACOG PO) VAYACOG - Historical  Medication  1 Daily Active    . potassium citrate (UROCIT-K) 10 MEQ (1080 MG) SR tablet Take by mouth.    . propranolol (INDERAL) 20 MG tablet Take 2 tablets (40 mg total) by mouth 2 (two) times daily. 120 tablet 6  . rosuvastatin (CRESTOR) 5 MG tablet Take by mouth.    . Sennosides (SENNA LAX PO) Take 1 tablet by mouth as needed.     No current facility-administered medications on file prior to visit.    Allergies:   Allergies  Allergen Reactions  . Sulfa Antibiotics Nausea Only    Physical Exam General: well developed, well nourished elderly Caucasian lady, seated, in no evident distress Head: head normocephalic and atraumatic. Orohparynx benign Neck: supple with no carotid or supraclavicular bruits Cardiovascular: regular rate and rhythm, no murmurs Musculoskeletal: no deformity Skin:  no rash/petichiae Vascular:  Normal pulses all extremities Filed Vitals:  02/04/15 1423  BP: 124/76  Pulse: 86    Neurologic Exam Mental Status: Awake and fully alert. Oriented to place and time. Recent and remote memory intact. Attention span, concentration and fund of knowledge appropriate. Mood and affect appropriate. MMSE 29/30. Clock drawing 4/4. Animal Naming Test 11. Geriatric depression scale 7/ MOCA test  28/30  With only 2 deficits in delayed recall. Cranial Nerves: Fundoscopic exam reveals sharp disc margins. Pupils equal, briskly reactive to light. Extraocular movements full without nystagmus. Visual fields full to confrontation. Hearing intact. Facial sensation intact. Face, tongue, palate moves normally and symmetrically.  Motor: Normal bulk and tone. Normal strength in all tested extremity muscles. Sensory.: intact to tough and pinprick and vibratory.  Coordination: Rapid alternating movements normal in all extremities. Finger-to-nose and heel-to-shin performed accurately bilaterally. Gait and Station: Arises from chair without difficulty. Stance is normal. Gait demonstrates normal  stride length and balance . Able to heel, toe and tandem walk without difficulty.  Reflexes: 1+ and symmetric. Toes downgoing.      ASSESSMENT: 89 year lady with progressive mild memory difficulties  X 3 years-likely mild cognitive impairment with some component of underlying anxiety/depression .Doubt this is dementia due to lot of significant progression over last 2 years    PLAN: I had a long discussion with the patient and her sister regarding her mild cognitive impairment and memory difficulties. I have reviewed results of neuropsych testing and discuss findings with them. I agree with Aricept but increase the dose to 10 mg daily if tolerated. I have discussed possible side effects with the patient and advised her to call me. She was also encouraged to participate in activities for cognitive challenge like solving crossword puzzles, sudoku and playing bridge. She was asked to keep her appointment with her therapist and continue treatment for anxiety. I advised the patient to limit her driving to daytime hours and to familiar roads. She may possibly consider participation in the [place] study in the future if interested. Return for follow-up in 2 months  Antony Contras, MD

## 2015-02-13 ENCOUNTER — Other Ambulatory Visit: Payer: Self-pay | Admitting: Family Medicine

## 2015-02-13 DIAGNOSIS — K589 Irritable bowel syndrome without diarrhea: Secondary | ICD-10-CM

## 2015-02-15 ENCOUNTER — Other Ambulatory Visit: Payer: Self-pay | Admitting: Family Medicine

## 2015-02-15 DIAGNOSIS — K589 Irritable bowel syndrome without diarrhea: Secondary | ICD-10-CM

## 2015-03-02 ENCOUNTER — Ambulatory Visit (INDEPENDENT_AMBULATORY_CARE_PROVIDER_SITE_OTHER): Payer: Medicare Other | Admitting: Family Medicine

## 2015-03-02 ENCOUNTER — Encounter: Payer: Self-pay | Admitting: Family Medicine

## 2015-03-02 VITALS — BP 116/80 | HR 80 | Temp 97.5°F | Resp 16 | Wt 140.2 lb

## 2015-03-02 DIAGNOSIS — N3091 Cystitis, unspecified with hematuria: Secondary | ICD-10-CM | POA: Diagnosis not present

## 2015-03-02 LAB — POCT URINALYSIS DIPSTICK
Bilirubin, UA: NEGATIVE
Glucose, UA: NEGATIVE
KETONES UA: NEGATIVE
Nitrite, UA: NEGATIVE
PH UA: 6.5
PROTEIN UA: NEGATIVE
SPEC GRAV UA: 1.01
UROBILINOGEN UA: 0.2

## 2015-03-02 MED ORDER — NITROFURANTOIN MONOHYD MACRO 100 MG PO CAPS: 100.0000 mg | ORAL_CAPSULE | Freq: Two times a day (BID) | ORAL | Status: DC

## 2015-03-02 NOTE — Patient Instructions (Signed)
We will call you with results of your urine culture

## 2015-03-02 NOTE — Progress Notes (Signed)
Subjective:     Patient ID: Brandi Hanson, female   DOB: 1936/05/04, 79 y.o.   MRN: [id number]  HPI  Chief Complaint  Patient presents with  . Urinary Tract Infection    patient comes in office today with complaints of discomfort with urination and blood in urine, patient reports that this is an ongoing issue and has never gone away. She could not specify how many days or weeks she has had current symptoms. Patient also complains of LLQ pain   Last treated for a UTI last year. Hx of diverticulosis   Review of Systems  Constitutional: Negative for fever and chills.       Objective:   Physical Exam  Constitutional: She appears well-developed. No distress.  Abdominal: Soft. There is no tenderness.  No cva tenderness     Assessment:    1. Cystitis with hematuria  - POCT urinalysis dipstick - Urine culture - nitrofurantoin, macrocrystal-monohydrate, (MACROBID) 100 MG capsule; Take 1 capsule (100 mg total) by mouth 2 (two) times daily.  Dispense: 14 capsule; Refill: 0    Plan:     Further f/u pending culture results

## 2015-03-04 ENCOUNTER — Telehealth: Payer: Self-pay

## 2015-03-04 LAB — URINE CULTURE

## 2015-03-04 NOTE — Telephone Encounter (Signed)
-----   Message from Carmon Ginsberg, [place] sent at 03/04/2015  7:55 AM EDT ----- No significant infection on culture. Is she feeling better on antibiotics?

## 2015-03-04 NOTE — Telephone Encounter (Signed)
Informed pt of lab results. Pt states she is feeling better on abx, though she believes it caused her to have diarrhea. Advised pt she can D/C abx if diarrhea persists. Renaldo Fiddler, CMA

## 2015-03-09 ENCOUNTER — Ambulatory Visit (INDEPENDENT_AMBULATORY_CARE_PROVIDER_SITE_OTHER): Payer: Medicare Other | Admitting: Family Medicine

## 2015-03-09 ENCOUNTER — Encounter: Payer: Self-pay | Admitting: Family Medicine

## 2015-03-09 VITALS — BP 130/72 | HR 80 | Temp 98.1°F | Resp 16 | Ht 66.0 in | Wt 140.0 lb

## 2015-03-09 DIAGNOSIS — K5900 Constipation, unspecified: Secondary | ICD-10-CM

## 2015-03-09 DIAGNOSIS — F329 Major depressive disorder, single episode, unspecified: Secondary | ICD-10-CM

## 2015-03-09 DIAGNOSIS — E785 Hyperlipidemia, unspecified: Secondary | ICD-10-CM

## 2015-03-09 DIAGNOSIS — K5909 Other constipation: Secondary | ICD-10-CM

## 2015-03-09 DIAGNOSIS — I10 Essential (primary) hypertension: Secondary | ICD-10-CM | POA: Diagnosis not present

## 2015-03-09 DIAGNOSIS — F419 Anxiety disorder, unspecified: Secondary | ICD-10-CM

## 2015-03-09 DIAGNOSIS — F32A Depression, unspecified: Secondary | ICD-10-CM

## 2015-03-09 MED ORDER — ROSUVASTATIN CALCIUM 5 MG PO TABS: 5.0000 mg | ORAL_TABLET | Freq: Every day | ORAL | Status: DC

## 2015-03-09 NOTE — Progress Notes (Signed)
Subjective:    Patient ID: Brandi Hanson, female    DOB: 1936-03-02, 79 y.o.   MRN: [id number]  Hypertension This is a chronic problem. The problem is unchanged. The problem is controlled. Associated symptoms include anxiety, chest pain, palpitations and shortness of breath. Pertinent negatives include no neck pain. Risk factors for coronary artery disease include dyslipidemia. There are no compliance problems.   Hyperlipidemia This is a chronic problem. The problem is controlled. Recent lipid tests were reviewed and are normal (Last check 10/29/2014  Total Cholesterol:  174; Tri:  119;  HDL:  52;  LDL:  98 ). Associated symptoms include chest pain and shortness of breath. Pertinent negatives include no focal weakness, leg pain or myalgias. There are no compliance problems.   Anxiety Presents for follow-up visit. Symptoms include chest pain, decreased concentration, nervous/anxious behavior, palpitations and shortness of breath. Patient reports no confusion, nausea or suicidal ideas. Symptoms occur most days. The severity of symptoms is interfering with daily activities.      Patient Active Problem List   Diagnosis Date Noted  . IBS (irritable bowel syndrome) 02/15/2015  . At risk for falling 01/08/2015  . Body mass index (BMI) of 23.0-23.9 in adult 01/08/2015  . H/O malignant neoplasm of breast 01/08/2015  . Chronic constipation 01/08/2015  . Clinical depression 01/08/2015  . Breast cancer, left breast 01/08/2015  . HZV (herpes zoster virus) post herpetic neuralgia 01/08/2015  . Apnea, sleep 01/08/2015  . Palpitations 08/21/2013  . Memory loss 06/09/2013  . Mild cognitive impairment 06/09/2013  . Anxiety state, unspecified 06/09/2013  . Benign essential HTN 01/02/2012  . Hyperlipidemia 01/02/2012  . History of nipple discharge 12/04/2011  . Breast cancer in situ 07/22/2011  . Abnormal blood sugar 01/31/2010  . Adaptive colitis 04/21/2009  . Beat, premature ventricular  04/21/2009  . Absolute anemia 02/05/2009  . Anxiety 02/05/2009  . Acid reflux 02/05/2009  . Essential (primary) hypertension 02/05/2009  . Hypercholesteremia 02/05/2009  . Bone/cartilage disorder 07/22/2007  . Avitaminosis D 10/08/2006  . Carotid artery obstruction 09/11/1998   History   Social History  . Marital Status: Single    Spouse Name: N/A  . Number of Children: N/A  . Years of Education: N/A   Occupational History  . Not on file.   Social History Main Topics  . Smoking status: Never Smoker   . Smokeless tobacco: Never Used  . Alcohol Use: No  . Drug Use: No  . Sexual Activity: Not on file   Other Topics Concern  . Not on file   Social History Narrative   Allergies  Allergen Reactions  . Sulfa Antibiotics Nausea Only   Previous Medications   ACETAMINOPHEN (TYLENOL) 500 MG TABLET    Take 500 mg by mouth every 6 (six) hours as needed.   ASPIRIN 81 MG TABLET    ASPIRIN EC LOW DOSE, 81MG  (Oral Tablet Delayed Release)  1 Every Day for 0 days  Quantity: 0.00;  Refills: 0   Ordered :12-May-2010  Abran Richard ;  Started 05-Feb-2009 Active Comments: DX: 272.0   CALCIUM CARBONATE (TUMS - DOSED IN MG ELEMENTAL CALCIUM) 500 MG CHEWABLE TABLET    Chew by mouth.   CALCIUM CARBONATE-VITAMIN D 600-200 MG-UNIT TABS    CALCIUM + D, 600-200MG -UNIT (Oral Tablet)  1 Twice Daily for 0 days  Quantity: 0.00;  Refills: 0   Ordered :12-May-2010  Abran Richard ;  Started 05-Feb-2009 Active Comments: DX: 733.90   CHOLECALCIFEROL (VITAMIN D3) 1000 UNITS  CAPS    Take by mouth.   CLIDINIUM-CHLORDIAZEPOXIDE (LIBRAX) 5-2.5 MG PER CAPSULE    TAKE 1 OR 2 CAPSULES BY MOUTH 3 TIMES A DAY AS NEEDED   DONEPEZIL (ARICEPT) 5 MG TABLET    Take 2 tablets (10 mg total) by mouth at bedtime.   LETROZOLE (FEMARA) 2.5 MG TABLET    Take by mouth.   LISINOPRIL (PRINIVIL,ZESTRIL) 5 MG TABLET    Take 1 tablet (5 mg total) by mouth daily.   PHOSPHATIDYLSERINE-DHA-EPA (VAYACOG PO)    VAYACOG - Historical Medication  1  Daily Active   POTASSIUM CITRATE (UROCIT-K) 10 MEQ (1080 MG) SR TABLET    Take by mouth.   PROPRANOLOL (INDERAL) 20 MG TABLET    Take 2 tablets (40 mg total) by mouth 2 (two) times daily.   ROSUVASTATIN (CRESTOR) 5 MG TABLET    Take by mouth.   SENNOSIDES (SENNA LAX PO)    Take 1 tablet by mouth as needed.   BP 130/72 mmHg  Pulse 80  Temp(Src) 98.1 F (36.7 C) (Oral)  Resp 16  Ht 5\' 6"  (1.676 m)  Wt 140 lb (63.504 kg)  BMI 22.61 kg/m2  Review of Systems  Constitutional: Negative.   Respiratory: Positive for shortness of breath. Negative for apnea, cough, choking, chest tightness, wheezing and stridor.   Cardiovascular: Positive for chest pain and palpitations. Negative for leg swelling.       Chronic Issue   Gastrointestinal: Positive for abdominal pain, diarrhea (Pt had diarrhea secondary to the antilbiotic, pt reports it is improving.  ) and constipation. Negative for nausea, vomiting, blood in stool, abdominal distention, anal bleeding and rectal pain.  Musculoskeletal: Positive for back pain and arthralgias. Negative for myalgias, joint swelling, gait problem, neck pain and neck stiffness.  Neurological: Negative.  Negative for focal weakness.  Psychiatric/Behavioral: Positive for decreased concentration. Negative for suicidal ideas, hallucinations, behavioral problems, confusion, sleep disturbance (Pt reports sleeping okay, but is having very vivid dreams lately.  ), self-injury, dysphoric mood and agitation. The patient is nervous/anxious. The patient is not hyperactive.        Objective:   Physical Exam  Constitutional: She is oriented to person, place, and time. She appears well-developed and well-nourished.  Cardiovascular: Normal rate and regular rhythm.   Pulmonary/Chest: Effort normal and breath sounds normal.  Neurological: She is alert and oriented to person, place, and time.  Psychiatric: She has a normal mood and affect. Her behavior is normal. Judgment and thought  content normal.  Patient very anxious and talkative as per her usual.           Assessment & Plan:   1. Anxiety  Stable. Continue current medication.    2. Hyperlipidemia Stable. Continue current medication and recheck at CPE.  - rosuvastatin (CRESTOR) 5 MG tablet; Take 1 tablet (5 mg total) by mouth at bedtime.  Dispense: 30 tablet; Refill: 5  3. Essential (primary) hypertension Stable. Continue current increased dose of Propranolol.   4. Clinical depression Stable.  Continue current medication.    5. Constipation-   Ok to continue Senna as she has been.   Margarita Rana, MD

## 2015-03-12 ENCOUNTER — Encounter: Payer: Self-pay | Admitting: Cardiovascular Disease

## 2015-03-12 ENCOUNTER — Encounter: Payer: Self-pay | Admitting: Internal Medicine

## 2015-03-24 ENCOUNTER — Ambulatory Visit (INDEPENDENT_AMBULATORY_CARE_PROVIDER_SITE_OTHER): Payer: No Typology Code available for payment source | Admitting: Licensed Clinical Social Worker

## 2015-03-24 DIAGNOSIS — F411 Generalized anxiety disorder: Secondary | ICD-10-CM | POA: Diagnosis not present

## 2015-03-24 DIAGNOSIS — G3184 Mild cognitive impairment, so stated: Secondary | ICD-10-CM | POA: Diagnosis not present

## 2015-03-24 DIAGNOSIS — F330 Major depressive disorder, recurrent, mild: Secondary | ICD-10-CM | POA: Diagnosis not present

## 2015-03-24 NOTE — Progress Notes (Signed)
THERAPIST PROGRESS NOTE  Session Time: 11:10 a.m. -  12:20 p.m.  Participation Level: Active  Behavioral Response: Well GroomedAlertAnxious and Depressed  Type of Therapy: Individual Therapy  Treatment Goals addressed: Communication: family dynamics given client's memory loss and Coping  Interventions: Solution Focused, Strength-based, Supportive and Family Systems  Summary: Brandi Hanson is a 79 y.o. female who returns to OPT to discuss areas of her life that she feels dissatisfied with and saddened by.  She is well-groomed, thoughts are a bit tangential and speech is delayed as client appears to search for the words she wants to use, yet overall she is able to express her thoughts and feelings adequately.  Client was appropriately tearful when talking about how kind her nephew is to her and things that he buys for her and assist her with around her home. She reports continued feelings of anxiety and feeling down and sad on some days, especially on days where she is not visiting with someone, especially her nephew, or participating in some activity.  "I still have guilt. It's about what I've said or haven't said."  She voiced some concern that she is still not on any medication for anxiety and depression.  She discussed various things that contribute to her symptoms, primarily when she feels alone and without visitors or things to do.  She admits that she has not done any recommended strategies to improve functioning and memory issues. Brandi Hanson voiced commitment to begin reading hand-outs provided by LCSW and other providers and to see what she can use.  Some positive things that she does when alone are puzzles in the newspaper and she also has a book with 100s of these.  Brandi Hanson also reported that she is reading more although she admits that she has difficulty remembering and also that she has bells ringing in her ears all the time and has a difficult time concentrating and staying focused.   Admits that she jumps "here and there" when reading and in conversations.  Does occasionally go out to lunch or dinner with friends that she worked with and does still go to [employer].  Family dynamics were discussed and the strain that often results due to having to rely on her sister and sister's family more because she is not married and without children of her own.  Brandi Hanson opened up more about what she means by guilty feelings in relation to her sister and things that she says under her breath.  Per client, it seems that she has anxiety about confronting her sister.  Goal for therapy:  "To find ways to not react in some way that's going to make me feel sad or guilty and let things go that I can't do anything about."   Suicidal/Homicidal: Negativewithout intent/plan  Therapist Response:   Supportive psychotherapy with insight was provided to client along with much emotional support and encouragement.  Assisted client to become more aware of common thinking errors that can intensify negative and frightening thoughts and impact a person's feelings, moods and outlook on life.  Examples used include: Feelings are Facts; Generalizing; fortune-telling and forecasting and mind-reading.  Commended client for opening up about her thoughts and feelings.  Assist in developing awareness of cognitive messages that reinforce hopelessness and helplessness and assist in self recognition of healthier cognitive messages that enhance self-confidence and improve coping strategies.  Validated client's feelings of being alone and desire for company/visitors more often than she receives.  Reassured her that if a time comes  that she wants to address some of her concerns and feelings with her sister that LCSW can assist her with a script of how to stay on topic with what she needs sister to know.  Also pointed out that client and LCSW can work together on developing additional aides that can support her memory where it is now and  strategies that may prompt her when she feels insecure about her memory.   Plan: Return again in two weeks.  Client and her sister, [name], are aware that they can call to clinic in between appointments as needed.  LCSW will discuss case with Dr. Jimmye Norman regarding appropriateness of medications to address mild depression and anxiety symptoms. Diagnosis: Mild Cognitive Impairment with memory loss   Generalized anxiety disorder   Major Depressive disorder, recurrent, mild    Miguel Dibble, LCSW 03/24/2015

## 2015-03-25 DIAGNOSIS — G3184 Mild cognitive impairment, so stated: Secondary | ICD-10-CM | POA: Insufficient documentation

## 2015-03-25 DIAGNOSIS — F411 Generalized anxiety disorder: Secondary | ICD-10-CM | POA: Insufficient documentation

## 2015-03-25 DIAGNOSIS — F330 Major depressive disorder, recurrent, mild: Secondary | ICD-10-CM | POA: Insufficient documentation

## 2015-04-15 ENCOUNTER — Encounter: Payer: Self-pay | Admitting: Neurology

## 2015-04-15 ENCOUNTER — Ambulatory Visit (INDEPENDENT_AMBULATORY_CARE_PROVIDER_SITE_OTHER): Payer: Medicare Other | Admitting: Neurology

## 2015-04-15 VITALS — BP 136/79 | HR 81 | Ht 66.0 in | Wt 137.6 lb

## 2015-04-15 DIAGNOSIS — F0390 Unspecified dementia without behavioral disturbance: Secondary | ICD-10-CM | POA: Diagnosis not present

## 2015-04-15 NOTE — Patient Instructions (Signed)
I had a long discussion with the patient, sister and nephew regarding her cognitive and memory difficulties which may have progressed slightly to early dementia. She seems to be tolerating Aricept fairly well but without obvious improvement. After long discussion with the available treatment options family has decided to try Namenda. We will begin with starter pack and increase as tolerated. Continue Aricept 10 mg daily. Patient doesn't wish to continue parts patient in activities for colic cognitively challenging exercises. She may consider possible participation in the [place] study later. Return for follow-up in 2 months or call earlier if necessary.

## 2015-04-16 NOTE — Progress Notes (Signed)
[facility] [address] [place]. [place] [address] [phone]       OFFICE FOLLOW UP NOTE  [name] [facility] Date of Birth:  1935/12/01 Medical Record Number:  [id number]   Referring MD:  Gaspar Garbe  Reason for Referral:  Memory loss  HPI: 89 year Caucasian lady  having mild progressive memory and cognitive difficulties for last 2 years which have been mildly progressive.She forgets  recent  Events, tasks and at times struggles to find words and finish sentences stopping in midsentence or occcasionally even stuttering..She misplaces things.at times she has been noticed as staring blankly into space and transiently unaware of her surroundings.She has longstanding h/o anxiety and has been on Librax 2.5 mg twice daily  For years and Inderal LA 60 mg has been added recently.She is yet independent in her ADLs and lives alone. There is no h/o headache, seizure, TIA, stroke, significant head injury with loss of consciousness.She has not had any brain imaging or lab work for causes of memory loss or detailed neuropsych testing. Update 08/12/13 : She returns for followup today and states that she did not start taking the fish oil as I had instructed and took only  the samples of Cerefolin I gave her and when she ran out she did not fill the prescription. She states her memory difficulties the about the same and anxiety seems to be increased. She did undergo the tests which I had ordered however. Vitamin B12, TSH and RPR on 06/09/13 were all normal. EEG done and 06/19/13 was normal without any epileptiform features. MRI scan of the brain on 06/20/13 showed mild changes of chronic microvascular ischemia and generalized cerebral atrophy. She admits that she is quite anxious and she has been taking Lipper ask and Inderal for a long time and has seen only her primary physician and perhaps may benefit by referral to a psychiatrist. She did not keep the appointment which we had made  [facility] as she prefers Dr Valentina Shaggy in [place] which is shorter drive for her. Update 02/04/2015 : She returns for follow-up today after last visit in December 2014. She is accompanied by sister. She continues to have cognitive difficulties mainly with short-term memory, multitasking, concentration. These subjectively seems to be worse but on objective testing today she actually did not score significantly less than last visit. She however did undergo detailed neuropsych testing on 10/22/14 by Dr. 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 Compared with similar testing by her in 2015 there appeared to be slight worsening. She was started on Aricept 5 mg daily 3 months ago by her primary physician she is tolerating it well without any GI or CNS side effects but the patient feels she is not subjectively any better. She was found to have difficulty with complex visual processing and advised to not drive or limit her driving. The patient informs me that she's not had any problems with driving and on my neurological exam she has full fields of vision and no major neurological deficits. She saw a psychiatrist in [place] but was not happy with him as he did not change her medications. Update 04/15/2015 : She returns for follow-up after last visit 2 months ago a complaint by sister and nephew. She continues to have memory difficulties as well as periods of transient confusion and disorientation off and on. She has now been on Aricept 10 mg for  the last 2 months and seems to be tolerating it reasonably well without significant GI side effects or dizziness. She has been driving without major problems but restricted to short distances. She does have difficulty with remembering directions. She has not had any delusions, hallucinations, falls, balance problems or safety concerns. On Mini-Mental status exam  today she scored 25 which is actually a drop from 29 from last visit. ROS:   14 system review of systems is positive for hearing loss, runny nose, constipation, diarrhea, leg swelling, palpitations, apnea, joint pain and swelling, back pain, walking difficulty, itching, memory loss, headache, speech difficulty, tremors, agitation, confusion  PMH:  Past Medical History  Diagnosis Date  . Hypertension   . PVC (premature ventricular contraction)   . Hyperlipidemia   . Arthritis   . Sleep apnea   . GERD (gastroesophageal reflux disease)   . Cancer     skin  . Anxiety   . Osteoporosis   . Abdominal pain   . Abdominal distention   . Palpitations   . Generalized headaches   . Confusion   . Rectal pain   . Breast cancer in situ 07/22/2011  . Benign essential HTN 01/02/2012  . Hyperlipidemia 01/02/2012  . Memory loss   . Depression     Social History:  History   Social History  . Marital Status: Single    Spouse Name: N/A  . Number of Children: N/A  . Years of Education: N/A   Occupational History  . Not on file.   Social History Main Topics  . Smoking status: Never Smoker   . Smokeless tobacco: Never Used  . Alcohol Use: No  . Drug Use: No  . Sexual Activity: Not on file   Other Topics Concern  . Not on file   Social History Narrative    Medications:   Current Outpatient Prescriptions on File Prior to Visit  Medication Sig Dispense Refill  . acetaminophen (TYLENOL) 500 MG tablet Take 500 mg by mouth every 6 (six) hours as needed.    Marland Kitchen aspirin 81 MG tablet ASPIRIN EC LOW DOSE, 81MG  (Oral Tablet Delayed Release)  1 Every Day for 0 days  Quantity: 0.00;  Refills: 0   Ordered :12-May-2010  Abran Richard ;  Started 05-Feb-2009 Active Comments: DX: 272.0    . calcium carbonate (TUMS - DOSED IN MG ELEMENTAL CALCIUM) 500 MG chewable tablet Chew by mouth.    . Calcium Carbonate-Vitamin D 600-200 MG-UNIT TABS CALCIUM + D, 600-200MG -UNIT (Oral Tablet)  1 Twice Daily for 0 days   Quantity: 0.00;  Refills: 0   Ordered :12-May-2010  Abran Richard ;  Started 05-Feb-2009 Active Comments: DX: 733.90    . Cholecalciferol (VITAMIN D3) 1000 UNITS CAPS Take by mouth.    . clidinium-chlordiazePOXIDE (LIBRAX) 5-2.5 MG per capsule TAKE 1 OR 2 CAPSULES BY MOUTH 3 TIMES A DAY AS NEEDED 120 capsule 5  . donepezil (ARICEPT) 5 MG tablet Take 2 tablets (10 mg total) by mouth at bedtime. 60 tablet 3  . letrozole (FEMARA) 2.5 MG tablet Take by mouth.    Marland Kitchen lisinopril (PRINIVIL,ZESTRIL) 5 MG tablet Take 1 tablet (5 mg total) by mouth daily. 90 tablet 3  . potassium citrate (UROCIT-K) 10 MEQ (1080 MG) SR tablet Take by mouth.    . propranolol (INDERAL) 20 MG tablet Take 2 tablets (40 mg total) by mouth 2 (two) times daily. 120 tablet 6  . rosuvastatin (CRESTOR) 5 MG tablet Take 1 tablet (5 mg total)  by mouth at bedtime. 30 tablet 5  . Sennosides (SENNA LAX PO) Take 1 tablet by mouth as needed.     No current facility-administered medications on file prior to visit.    Allergies:   Allergies  Allergen Reactions  . Sulfa Antibiotics Nausea Only    Physical Exam General: well developed, well nourished elderly Caucasian lady, seated, in no evident distress Head: head normocephalic and atraumatic.   Neck: supple with no carotid or supraclavicular bruits Cardiovascular: regular rate and rhythm, no murmurs Musculoskeletal: no deformity Skin:  no rash/petichiae Vascular:  Normal pulses all extremities Filed Vitals:   04/15/15 0918  BP: 136/79  Pulse: 81    Neurologic Exam Mental Status: Awake and fully alert. Oriented to place and time. Recent and remote memory intact. Attention span, concentration and fund of knowledge appropriate. Mood and affect appropriate. MMSE 25/30. Clock drawing 4/4. Animal Naming Test 11.  Cranial Nerves: Fundoscopic exam not done.  Pupils equal, briskly reactive to light. Extraocular movements full without nystagmus. Visual fields full to confrontation. Hearing  intact. Facial sensation intact. Face, tongue, palate moves normally and symmetrically.  Motor: Normal bulk and tone. Normal strength in all tested extremity muscles. Sensory.: intact to tough and pinprick and vibratory.  Coordination: Rapid alternating movements normal in all extremities. Finger-to-nose and heel-to-shin performed accurately bilaterally. Gait and Station: Arises from chair without difficulty. Stance is normal. Gait demonstrates normal stride length and balance . Able to heel, toe and tandem walk without difficulty.  Reflexes: 1+ and symmetric. Toes downgoing.      ASSESSMENT: 16 year lady with progressive mild memory difficulties  X 3 years-likely mild cognitive impairment with some component of underlying anxiety/depression .Feel now this is early dementia due to  significant progression over last 2 years    PLAN: I had a long discussion with the patient, sister and nephew regarding her cognitive and memory difficulties which may have progressed slightly to early dementia. She seems to be tolerating Aricept fairly well but without obvious improvement. After long discussion with the patient and family about available treatment options family has decided to try Namenda. We will begin with starter pack and increase as tolerated. Continue Aricept 10 mg daily. Patient doesn't wish to continue parts patient in activities for colic cognitively challenging exercises. She may consider possible participation in the [place] study later. Greater than 50% of time during this 30 minute visit was spent on counseling and coordination of care. Return for follow-up in 2 months or call earlier if necessary.  Antony Contras, MD

## 2015-04-19 ENCOUNTER — Other Ambulatory Visit: Payer: Self-pay | Admitting: Neurology

## 2015-04-19 ENCOUNTER — Telehealth: Payer: Self-pay | Admitting: Neurology

## 2015-04-19 MED ORDER — MEMANTINE HCL 28 X 5 MG & 21 X 10 MG PO TABS: ORAL_TABLET | ORAL | Status: DC

## 2015-04-19 NOTE — Telephone Encounter (Signed)
Patient's sister [name]) called stating pharmacy does not have actual RX. Would you please check to see if RX was sent. I could not locate it in the snapshot. Family would rather have RX than starter pack as they live 30 min away and would rather go to pharmacy. She can be reached at [phone] or [phone].

## 2015-04-19 NOTE — Telephone Encounter (Signed)
I ordered it again in epic

## 2015-04-19 NOTE — Telephone Encounter (Signed)
Last OV note says: After long discussion with the patient and family about available treatment options family has decided to try Namenda. We will begin with starter pack and increase as tolerated. Rx has been added and sent.  I called the patient back.  They are aware.

## 2015-04-19 NOTE — Telephone Encounter (Signed)
Brandi Hanson called regarding medication Dr. Leonie Man was going to send into the pharmacy electronically. Pharmacy says they don't have the prescription.

## 2015-04-19 NOTE — Telephone Encounter (Signed)
Spoke with patient, informed her to call pharmacy and check for [employer]... If it is not available, we have a starter pack here at [facility] and she can pick up.  Patient states she will call office back

## 2015-04-20 NOTE — Telephone Encounter (Signed)
Rn talk to Wynona Canes patients sister about the [place] at [facility]. Ms.Cheek stated her sister has already pick up the medication from the pharmacy.

## 2015-05-18 ENCOUNTER — Telehealth: Payer: Self-pay | Admitting: Neurology

## 2015-05-18 DIAGNOSIS — F0390 Unspecified dementia without behavioral disturbance: Secondary | ICD-10-CM

## 2015-05-18 NOTE — Telephone Encounter (Signed)
Dr Dohmeier call patient and left voice message about medication issue. Also Rn call patient and left message about her symptoms. Patient told phone staff she was still going out on day trip. Rn tried calling [phone], but could not leave voice message. Rn did call patients number she left with the phone staff at [phone]. Pts phone was having a bad connection she was on the way to [place], she stated the 10 of namenda for the last 3 days made her feel funny. She was taking 10mg  twice a day. Pt stated she did not take it today.Pt will call back tomorrow, and nurse will call her to discuss the options of what the working MD suggested. Pt stated it was okay to call to her tomorrow on her home phone.

## 2015-05-18 NOTE — Telephone Encounter (Signed)
Patient is calling back about Rx namenda and states that taking the lower dosage might be better for her. She wishes to use [employer] [facility].  Please call her at [phone].  thanks

## 2015-05-18 NOTE — Telephone Encounter (Signed)
I called the patient. I talk with the sister, the patient is not available. I will try calling back later. Her cell phone is [phone].

## 2015-05-18 NOTE — Telephone Encounter (Signed)
ILeft Vm - patient called Brandi Hanson back. The patient already left on her trip. I suggested that she will name her pharmacy closest to her in [place] if she wants to refill at a lower dose.  The patient declined,  stating that she hadn't taken her medication today and felt fine and she will be calling us back when she returns. CD

## 2015-05-18 NOTE — Telephone Encounter (Signed)
Patient phoned regarding memantine [facility] TITRATION PACK) tablet pack, having symptoms, Sunday worst day with palpitations, "I'm a nervous person anyway and this scared me. When I got to the highest dosage was when this palpitation thing built up". Is supposed to go on day trip to [place] with friends today and wants to know if she will be ok to go and wanted to see what the Dr. Lennette Bihari. She didn't take the last 2 (2- 10mg .) Hasn't felt as bad since she didn't take the last 2. Needs to know something ASAP since she is to be picked up by friend at 9:45am.

## 2015-05-18 NOTE — Telephone Encounter (Signed)
Patient can be reached today on [phone]

## 2015-05-19 MED ORDER — MEMANTINE HCL 5 MG PO TABS: 5.0000 mg | ORAL_TABLET | Freq: Two times a day (BID) | ORAL | Status: DC

## 2015-05-19 NOTE — Telephone Encounter (Signed)
If patient calls back give her the message below that her namenda was sent to [facility] and it ready for pick up.  Rn call patient and left voice message.Pts namenda was sent e-scribed to the pharmacy today to [facility]. Pt is to take 5mg  twice a day, which is a lower dosage.

## 2015-05-19 NOTE — Telephone Encounter (Signed)
Refilled for 5 mg bid.

## 2015-06-03 ENCOUNTER — Encounter: Payer: Self-pay | Admitting: Family Medicine

## 2015-06-03 ENCOUNTER — Ambulatory Visit (INDEPENDENT_AMBULATORY_CARE_PROVIDER_SITE_OTHER): Payer: Medicare Other | Admitting: Family Medicine

## 2015-06-03 VITALS — BP 122/68 | HR 84 | Temp 98.3°F | Resp 16 | Wt 139.0 lb

## 2015-06-03 DIAGNOSIS — N3090 Cystitis, unspecified without hematuria: Secondary | ICD-10-CM | POA: Diagnosis not present

## 2015-06-03 LAB — POCT URINALYSIS DIPSTICK
BILIRUBIN UA: NEGATIVE
Glucose, UA: NEGATIVE
KETONES UA: NEGATIVE
Nitrite, UA: POSITIVE
Spec Grav, UA: >=1.03
Urobilinogen, UA: 0.2
pH, UA: 6

## 2015-06-03 MED ORDER — NITROFURANTOIN MONOHYD MACRO 100 MG PO CAPS: 100.0000 mg | ORAL_CAPSULE | Freq: Two times a day (BID) | ORAL | Status: DC

## 2015-06-03 NOTE — Progress Notes (Signed)
Patient ID: Brandi Hanson, female   DOB: Oct 02, 1935, 79 y.o.   MRN: [id number]        Patient: Brandi Hanson Female    DOB: 12-Jun-1936   79 y.o.   MRN: [id number] Visit Date: 06/03/2015  Today's Provider: Margarita Rana, MD   Chief Complaint  Patient presents with  . Urinary Tract Infection   Subjective:    Urinary Tract Infection  This is a new problem. The current episode started in the past 7 days. The problem has been unchanged. The patient is experiencing no pain. Associated symptoms include frequency, hesitancy and urgency. Pertinent negatives include no chills, hematuria, nausea or vomiting.   Has had similar previous. Antibiotic gets it better. Did see Mikki Santee in June and was put on Macrobid. Did help some but now  it is back. Thinks this is the same problem.  Did get  100 percent better.     Allergies  Allergen Reactions  . Sulfa Antibiotics Nausea Only   Previous Medications   ACETAMINOPHEN (TYLENOL) 500 MG TABLET    Take 500 mg by mouth every 6 (six) hours as needed.   ASPIRIN 81 MG TABLET    ASPIRIN EC LOW DOSE, 81MG  (Oral Tablet Delayed Release)  1 Every Day for 0 days  Quantity: 0.00;  Refills: 0   Ordered :12-May-2010  Abran Richard ;  Started 05-Feb-2009 Active Comments: DX: 272.0   CALCIUM CARBONATE (TUMS - DOSED IN MG ELEMENTAL CALCIUM) 500 MG CHEWABLE TABLET    Chew by mouth.   CALCIUM CARBONATE-VITAMIN D 600-200 MG-UNIT TABS    CALCIUM + D, 600-200MG -UNIT (Oral Tablet)  1 Twice Daily for 0 days  Quantity: 0.00;  Refills: 0   Ordered :12-May-2010  Abran Richard ;  Started 05-Feb-2009 Active Comments: DX: 733.90   CHOLECALCIFEROL (VITAMIN D3) 1000 UNITS CAPS    Take by mouth.   CLIDINIUM-CHLORDIAZEPOXIDE (LIBRAX) 5-2.5 MG PER CAPSULE    TAKE 1 OR 2 CAPSULES BY MOUTH 3 TIMES A DAY AS NEEDED   DONEPEZIL (ARICEPT) 5 MG TABLET    Take 2 tablets (10 mg total) by mouth at bedtime.   LETROZOLE (FEMARA) 2.5 MG TABLET    Take by mouth.   LISINOPRIL (PRINIVIL,ZESTRIL) 5 MG TABLET     Take 1 tablet (5 mg total) by mouth daily.   MEMANTINE (NAMENDA) 5 MG TABLET    Take 1 tablet (5 mg total) by mouth 2 (two) times daily.   POTASSIUM CITRATE (UROCIT-K) 10 MEQ (1080 MG) SR TABLET    Take by mouth.   PROPRANOLOL (INDERAL) 20 MG TABLET    Take 2 tablets (40 mg total) by mouth 2 (two) times daily.   ROSUVASTATIN (CRESTOR) 5 MG TABLET    Take 1 tablet (5 mg total) by mouth at bedtime.   SENNOSIDES (SENNA LAX PO)    Take 1 tablet by mouth as needed.    Review of Systems  Constitutional: Negative for fever, chills, diaphoresis, activity change, appetite change, fatigue and unexpected weight change.  Gastrointestinal: Positive for abdominal pain (Left upper abdominal pain.) and constipation. Negative for nausea, vomiting, diarrhea, blood in stool, abdominal distention, anal bleeding and rectal pain.  Genitourinary: Positive for dysuria, hesitancy, urgency, frequency and difficulty urinating. Negative for hematuria, decreased urine volume, vaginal bleeding, vaginal discharge, enuresis, vaginal pain, menstrual problem, pelvic pain and dyspareunia.  Neurological: Negative for dizziness, light-headedness and headaches.    Social History  Substance Use Topics  . Smoking status: Never Smoker   .  Smokeless tobacco: Never Used  . Alcohol Use: No   Objective:   BP 122/68 mmHg  Pulse 84  Temp(Src) 98.3 F (36.8 C) (Oral)  Resp 16  Wt 139 lb (63.05 kg)  Physical Exam  Constitutional: She appears well-developed and well-nourished. No distress.  Cardiovascular: Normal rate.   Pulmonary/Chest: Breath sounds normal.  Abdominal: She exhibits no distension and no mass. There is tenderness (more  on left side. ). There is no rebound, no guarding and no CVA tenderness.      Assessment & Plan:     1. Cystitis Urine was negative last time.  Will send for culture.  Will treat with Macrobid. Recheck at follow up.  - POCT urinalysis dipstick Results for orders placed or performed in visit  on 06/03/15  POCT urinalysis dipstick  Result Value Ref Range   Color, UA Yellow    Clarity, UA Clear    Glucose, UA Negative    Bilirubin, UA Negative    Ketones, UA Negative    Spec Grav, UA >=1.030    Blood, UA NH Moderate    pH, UA 6.0    Protein, UA + 30    Urobilinogen, UA 0.2    Nitrite, UA Positive    Leukocytes, UA Trace (A) Negative         Margarita Rana, MD  [facility]

## 2015-06-05 LAB — URINE CULTURE

## 2015-06-08 ENCOUNTER — Telehealth: Payer: Self-pay

## 2015-06-08 NOTE — Telephone Encounter (Signed)
-----   Message from Margarita Rana, MD sent at 06/06/2015 12:49 PM EDT ----- Treated. Please notify patient. Thanks.

## 2015-06-08 NOTE — Telephone Encounter (Signed)
Pt advised.   Thanks,   -Laura  

## 2015-06-16 ENCOUNTER — Ambulatory Visit (INDEPENDENT_AMBULATORY_CARE_PROVIDER_SITE_OTHER): Payer: Medicare Other | Admitting: Licensed Clinical Social Worker

## 2015-06-16 DIAGNOSIS — G3184 Mild cognitive impairment, so stated: Secondary | ICD-10-CM | POA: Diagnosis not present

## 2015-06-16 DIAGNOSIS — F330 Major depressive disorder, recurrent, mild: Secondary | ICD-10-CM | POA: Diagnosis not present

## 2015-06-16 DIAGNOSIS — F411 Generalized anxiety disorder: Secondary | ICD-10-CM | POA: Diagnosis not present

## 2015-06-16 NOTE — Progress Notes (Signed)
THERAPIST PROGRESS NOTE  Session Time: 10:20 a.m. (client was 10 minutes late to appointment) - 11:30 a.m.  Participation Level: Active  Behavioral Response: Negative and Well GroomedAlertAnxious and Depressed  Type of Therapy: Individual Therapy  Treatment Goals addressed: Anxiety, Communication: of needs and dislikes and Coping  Interventions: CBT, Solution Focused, Psychosocial Skills: Assertiveness and less internalization, Supportive and Reframing  Summary: GERALYNN CAPRI is a 79 y.o. female who presents with ongoing symptoms of anxiety, self admitted worry and loneliness and mild depressive symptoms that she experiences as disappointment and regrets along with feelings of guilt and shame for being upset with her sister who is a very active part of client's life.  Client brought to session by her sister, [name], after not seeing LCSW for a while.  Client continues to voice feeling anxiety, depression and guilt around feeling disappointed in herself because she becomes upset with her sister.  Yuvonne stated that her sister accuses her of being too sensitive about comments and the tone of sister's voice.  Client agrees that she is and voiced sadness that she does not believe that the sister understands how this causes her to feel.  Losses related to her memory impairments. "I so much want to get better. I guess some things might get better."  As a result of her cognitive deficits, Mlissa has a hard time keeping up with her sister in terms of conversations and even activities.  Sadness voiced given the changes in the roles with client more dependent on her sister and this seems to bother her with client commenting "And she's the older one."  Some positive things that she does when alone are puzzles in the newspaper and she also has a book with 100s of these puzzles.  Devonia also reported that she is reading more although she admits that she has difficulty remembering and also that she has bells  ringing in her ears all the time and has a difficult time concentrating and staying focused.  Admits that she jumps "here and there" when reading and in conversations due to reported struggle keeping her focus/attention.  Does occasionally go out to lunch or dinner with friends that she worked with and does still go to [employer].  No problems enjoying herself per client although she does admit that she feels embarrassed at times due to episodes of becoming lost in a conversation.  Family dynamics were discussed and the strain that often results due to having to rely on her sister and sister's family more because she is not married and without children of her own.  Analeise opened up more about what she means by guilty feelings in relation to her sister and things that she says under her breath.  Per client, it seems that she has anxiety about confronting her sister about how sister says things in a way that results in client having a negative view of herself and that client experiences as judgmental or critical of her.  Kade is nicely groomed, denied fears about living at home alone and does voice much appreciation to her sister for the help provided.  Client is especially fond of her nephew and admits to feeling jealous and sad when he cannot spend time with her.  No additional concerns or problems voiced by client.  She remains receptive to therapy process and expressed understanding that she has the right to talk about her feelings and that these do not reflect badly of her, rather this is a healthy way of  talking out loud about experiences that may keep her anxious un-necessarily.  Client voiced understanding about LCSW's resignation and expressed appropriate disappointment with the news.  She was receptive to the letter provided that offers client other options for OPT.  Kandiss agreed to return to see LCSW in a few weeks and will invite her sister to be in the meeting to discuss additional outpatient options.     Suicidal/Homicidal: Negativewithout intent/plan  Therapist Response:   Supportive psychotherapy with insight was provided to client along with much emotional support and encouragement.  Commended client for opening up about her thoughts and feelings while normalizing the experience of disappointment in loved ones. Psycho-education about how to allow herself to experience both positive and negative emotions about her sister without judging herself.  Assist in developing awareness of cognitive messages that reinforce hopelessness and helplessness and assist in self recognition of healthier ways of thinking and acting that enhance self-confidence and improve coping strategies.  Validated client's feelings of being alone and desire for company/visitors more often than she receives.  LCSW gently pointed out that it may improve how client copes as well as improve any strain in she and sister's relationship to have the two talk with LCSW to clear things up.  LCSW notified client that LCSW will be leaving this clinic and provided her with a letter with additional outpatient service providers within and separate from [facility].  Plan: Return again in two weeks.  Client and her sister, [name], are aware that they can call to clinic in between appointments as needed.  LCSW will discuss case with Dr. Jimmye Norman regarding appropriateness of medications to address mild depression and anxiety symptoms. Diagnosis: Mild Cognitive Impairment with memory loss   Generalized anxiety disorder   Major Depressive disorder, recurrent, mild   Miguel Dibble, LCSW 06/16/2015

## 2015-06-22 ENCOUNTER — Other Ambulatory Visit: Payer: Self-pay

## 2015-06-22 MED ORDER — DONEPEZIL HCL 5 MG PO TABS: 10.0000 mg | ORAL_TABLET | Freq: Every day | ORAL | Status: DC

## 2015-06-23 ENCOUNTER — Encounter: Payer: Self-pay | Admitting: Neurology

## 2015-06-23 ENCOUNTER — Encounter: Payer: Self-pay | Admitting: Family Medicine

## 2015-06-23 ENCOUNTER — Ambulatory Visit (INDEPENDENT_AMBULATORY_CARE_PROVIDER_SITE_OTHER): Payer: Medicare Other | Admitting: Neurology

## 2015-06-23 VITALS — BP 126/67 | HR 75 | Wt 139.0 lb

## 2015-06-23 DIAGNOSIS — G300 Alzheimer's disease with early onset: Secondary | ICD-10-CM

## 2015-06-23 DIAGNOSIS — F0280 Dementia in other diseases classified elsewhere without behavioral disturbance: Secondary | ICD-10-CM

## 2015-06-23 NOTE — Patient Instructions (Signed)
I had a long discussion with the patient, son and daughter regarding her dementia and she appears quite stable on the current medication regime hence continue Aricept 10 mg daily and Namenda 5 mg twice daily as she was unable to tolerate a higher dose. I encouraged her to increase participation in mentally challenging activities like solving crossword puzzles, sudoku and playing bridge. She was also advised fall and safety precautions. She was given written information to review about possible participation in [place] dementia trial if interested. She will return for follow-up in 6 months or call earlier if necessary. Fall Prevention in the Home  Falls can cause injuries and can affect people from all age groups. There are many simple things that you can do to make your home safe and to help prevent falls. WHAT CAN I DO ON THE OUTSIDE OF MY HOME?  Regularly repair the edges of walkways and driveways and fix any cracks.  Remove high doorway thresholds.  Trim any shrubbery on the main path into your home.  Use bright outdoor lighting.  Clear walkways of debris and clutter, including tools and rocks.  Regularly check that handrails are securely fastened and in good repair. Both sides of any steps should have handrails.  Install guardrails along the edges of any raised decks or porches.  Have leaves, snow, and ice cleared regularly.  Use sand or salt on walkways during winter months.  In the garage, clean up any spills right away, including grease or oil spills. WHAT CAN I DO IN THE BATHROOM?  Use night lights.  Install grab bars by the toilet and in the tub and shower. Do not use towel bars as grab bars.  Use non-skid mats or decals on the floor of the tub or shower.  If you need to sit down while you are in the shower, use a plastic, non-slip stool.Marland Kitchen  Keep the floor dry. Immediately clean up any water that spills on the floor.  Remove soap buildup in the tub or shower on a regular  basis.  Attach bath mats securely with double-sided non-slip rug tape.  Remove throw rugs and other tripping hazards from the floor. WHAT CAN I DO IN THE BEDROOM?  Use night lights.  Make sure that a bedside light is easy to reach.  Do not use oversized bedding that drapes onto the floor.  Have a firm chair that has side arms to use for getting dressed.  Remove throw rugs and other tripping hazards from the floor. WHAT CAN I DO IN THE KITCHEN?   Clean up any spills right away.  Avoid walking on wet floors.  Place frequently used items in easy-to-reach places.  If you need to reach for something above you, use a sturdy step stool that has a grab bar.  Keep electrical cables out of the way.  Do not use floor polish or wax that makes floors slippery. If you have to use wax, make sure that it is non-skid floor wax.  Remove throw rugs and other tripping hazards from the floor. WHAT CAN I DO IN THE STAIRWAYS?  Do not leave any items on the stairs.  Make sure that there are handrails on both sides of the stairs. Fix handrails that are broken or loose. Make sure that handrails are as long as the stairways.  Check any carpeting to make sure that it is firmly attached to the stairs. Fix any carpet that is loose or worn.  Avoid having throw rugs at the top  or bottom of stairways, or secure the rugs with carpet tape to prevent them from moving.  Make sure that you have a light switch at the top of the stairs and the bottom of the stairs. If you do not have them, have them installed. WHAT ARE SOME OTHER FALL PREVENTION TIPS?  Wear closed-toe shoes that fit well and support your feet. Wear shoes that have rubber soles or low heels.  When you use a stepladder, make sure that it is completely opened and that the sides are firmly locked. Have someone hold the ladder while you are using it. Do not climb a closed stepladder.  Add color or contrast paint or tape to grab bars and handrails  in your home. Place contrasting color strips on the first and last steps.  Use mobility aids as needed, such as canes, walkers, scooters, and crutches.  Turn on lights if it is dark. Replace any light bulbs that burn out.  Set up furniture so that there are clear paths. Keep the furniture in the same spot.  Fix any uneven floor surfaces.  Choose a carpet design that does not hide the edge of steps of a stairway.  Be aware of any and all pets.  Review your medicines with your healthcare provider. Some medicines can cause dizziness or changes in blood pressure, which increase your risk of falling. Talk with your health care provider about other ways that you can decrease your risk of falls. This may include working with a physical therapist or trainer to improve your strength, balance, and endurance.   This information is not intended to replace advice given to you by your health care provider. Make sure you discuss any questions you have with your health care provider.   Document Released: 08/18/2002 Document Revised: 01/12/2015 Document Reviewed: 10/02/2014 Elsevier Interactive Patient Education [employer].

## 2015-06-23 NOTE — Progress Notes (Signed)
[facility] [address] [place]. [place] [address] [phone]       OFFICE FOLLOW UP NOTE  Ms. Lidia Clavijo [facility] Date of Birth:  1935/12/01 Medical Record Number:  [id number]   Referring MD:  Gaspar Garbe  Reason for Referral:  Memory loss  HPI: 89 year Caucasian lady  having mild progressive memory and cognitive difficulties for last 2 years which have been mildly progressive.She forgets  recent  Events, tasks and at times struggles to find words and finish sentences stopping in midsentence or occcasionally even stuttering..She misplaces things.at times she has been noticed as staring blankly into space and transiently unaware of her surroundings.She has longstanding h/o anxiety and has been on Librax 2.5 mg twice daily  For years and Inderal LA 60 mg has been added recently.She is yet independent in her ADLs and lives alone. There is no h/o headache, seizure, TIA, stroke, significant head injury with loss of consciousness.She has not had any brain imaging or lab work for causes of memory loss or detailed neuropsych testing. Update 08/12/13 : She returns for followup today and states that she did not start taking the fish oil as I had instructed and took only  the samples of Cerefolin I gave her and when she ran out she did not fill the prescription. She states her memory difficulties the about the same and anxiety seems to be increased. She did undergo the tests which I had ordered however. Vitamin B12, TSH and RPR on 06/09/13 were all normal. EEG done and 06/19/13 was normal without any epileptiform features. MRI scan of the brain on 06/20/13 showed mild changes of chronic microvascular ischemia and generalized cerebral atrophy. She admits that she is quite anxious and she has been taking Lipper ask and Inderal for a long time and has seen only her primary physician and perhaps may benefit by referral to a psychiatrist. She did not keep the appointment which we had made  [facility] as she prefers Dr Valentina Shaggy in [place] which is shorter drive for her. Update 02/04/2015 : She returns for follow-up today after last visit in December 2014. She is accompanied by sister. She continues to have cognitive difficulties mainly with short-term memory, multitasking, concentration. These subjectively seems to be worse but on objective testing today she actually did not score significantly less than last visit. She however did undergo detailed neuropsych testing on 10/22/14 by Dr. 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 Compared with similar testing by her in 2015 there appeared to be slight worsening. She was started on Aricept 5 mg daily 3 months ago by her primary physician she is tolerating it well without any GI or CNS side effects but the patient feels she is not subjectively any better. She was found to have difficulty with complex visual processing and advised to not drive or limit her driving. The patient informs me that she's not had any problems with driving and on my neurological exam she has full fields of vision and no major neurological deficits. She saw a psychiatrist in [place] but was not happy with him as he did not change her medications. Update 04/15/2015 : She returns for follow-up after last visit 2 months ago a complaint by sister and nephew. She continues to have memory difficulties as well as periods of transient confusion and disorientation off and on. She has now been on Aricept 10 mg for  the last 2 months and seems to be tolerating it reasonably well without significant GI side effects or dizziness. She has been driving without major problems but restricted to short distances. She does have difficulty with remembering directions. She has not had any delusions, hallucinations, falls, balance problems or safety concerns. On Mini-Mental status exam  today she scored 25 which is actually a drop from 29 from last visit. Update 06/23/15 : She returns for follow-up to last visit 2 months ago. She is a compared by her daughter and son. They have noticed some improvement in her memory and confusion. He was not able to tolerate Namenda when she increase the dose to 10 mg twice daily and hence went down to 5 mg twice daily on 05/18/15 which is tolerating much better. She in fact scored 29/30 on the Mini-Mental which is improved from 25/30 at last visit. She still has some good days and bad days. But she is more interactive and can produce patent conversations better. She denies significant nausea, vomiting, diarrhea or dizziness. There have been no major safety of issues. She has some mild impaired balance but has not had major injury or fall. She does not have significant delusions or hallucinations but does occasionally get agitated but can be redirected. She continues to live alone and her daughter lives across the street and has close supervision. She is driving to a limited extent and there have been no accidents. ROS:   14 system review of systems is positive for hearing loss,   agitation, confusion, ringing in the ears, runny nose, excessive sweating, light sensitivity, shortness of breath, palpitations, constipation, swollen abdomen, apnea, frequent waking, environmental allergies, memory loss, tremors, urinary decrease and all other systems negative  PMH:  Past Medical History  Diagnosis Date  . Hypertension   . PVC (premature ventricular contraction)   . Hyperlipidemia   . Arthritis   . Sleep apnea   . GERD (gastroesophageal reflux disease)   . Cancer ([place])     skin  . Anxiety   . Osteoporosis   . Abdominal pain   . Abdominal distention   . Palpitations   . Generalized headaches   . Confusion   . Rectal pain   . Breast cancer in situ 07/22/2011  . Benign essential HTN 01/02/2012  . Hyperlipidemia 01/02/2012  . Memory loss   .  Depression     Social History:  Social History   Social History  . Marital Status: Single    Spouse Name: N/A  . Number of Children: N/A  . Years of Education: N/A   Occupational History  . Not on file.   Social History Main Topics  . Smoking status: Never Smoker   . Smokeless tobacco: Never Used  . Alcohol Use: No  . Drug Use: No  . Sexual Activity: Not on file   Other Topics Concern  . Not on file   Social History Narrative    Medications:   Current Outpatient Prescriptions on File Prior to Visit  Medication Sig Dispense Refill  . acetaminophen (TYLENOL) 500 MG tablet Take 500 mg by mouth every 6 (six) hours as needed.    Marland Kitchen aspirin 81 MG tablet ASPIRIN EC LOW DOSE, 81MG  (Oral Tablet Delayed Release)  1 Every Day for 0 days  Quantity: 0.00;  Refills: 0   Ordered :12-May-2010  Abran Richard ;  Started 05-Feb-2009 Active Comments: DX: 272.0    . calcium carbonate (TUMS - DOSED IN MG ELEMENTAL CALCIUM) 500 MG  chewable tablet Chew by mouth.    . Calcium Carbonate-Vitamin D 600-200 MG-UNIT TABS CALCIUM + D, 600-200MG -UNIT (Oral Tablet)  1 Twice Daily for 0 days  Quantity: 0.00;  Refills: 0   Ordered :12-May-2010  Abran Richard ;  Started 05-Feb-2009 Active Comments: DX: 733.90    . Cholecalciferol (VITAMIN D3) 1000 UNITS CAPS Take by mouth.    . clidinium-chlordiazePOXIDE (LIBRAX) 5-2.5 MG per capsule TAKE 1 OR 2 CAPSULES BY MOUTH 3 TIMES A DAY AS NEEDED 120 capsule 5  . donepezil (ARICEPT) 5 MG tablet Take 2 tablets (10 mg total) by mouth at bedtime. 60 tablet 3  . letrozole (FEMARA) 2.5 MG tablet Take by mouth.    Marland Kitchen lisinopril (PRINIVIL,ZESTRIL) 5 MG tablet Take 1 tablet (5 mg total) by mouth daily. 90 tablet 3  . memantine (NAMENDA) 5 MG tablet Take 1 tablet (5 mg total) by mouth 2 (two) times daily. 180 tablet 3  . potassium citrate (UROCIT-K) 10 MEQ (1080 MG) SR tablet Take by mouth.    . propranolol (INDERAL) 20 MG tablet Take 2 tablets (40 mg total) by mouth 2 (two) times  daily. 120 tablet 6  . rosuvastatin (CRESTOR) 5 MG tablet Take 1 tablet (5 mg total) by mouth at bedtime. 30 tablet 5  . Sennosides (SENNA LAX PO) Take 1 tablet by mouth as needed.     No current facility-administered medications on file prior to visit.    Allergies:   Allergies  Allergen Reactions  . Sulfa Antibiotics Nausea Only    Physical Exam General: well developed, well nourished elderly Caucasian lady, seated, in no evident distress Head: head normocephalic and atraumatic.   Neck: supple with no carotid or supraclavicular bruits Cardiovascular: regular rate and rhythm, no murmurs Musculoskeletal: no deformity Skin:  no rash/petichiae Vascular:  Normal pulses all extremities Filed Vitals:   06/23/15 1006  BP: 126/67  Pulse: 75    Neurologic Exam Mental Status: Awake and fully alert. Oriented to place and time. Recent and remote memory intact. Attention span, concentration and fund of knowledge appropriate. Mood and affect appropriate. MMSE 29/30. Clock drawing 4/4. Animal Naming Test 17.  Cranial Nerves: Fundoscopic exam not done.  Pupils equal, briskly reactive to light. Extraocular movements full without nystagmus. Visual fields full to confrontation. Hearing intact. Facial sensation intact. Face, tongue, palate moves normally and symmetrically.  Motor: Normal bulk and tone. Normal strength in all tested extremity muscles. Sensory.: intact to tough and pinprick and vibratory.  Coordination: Rapid alternating movements normal in all extremities. Finger-to-nose and heel-to-shin performed accurately bilaterally. Gait and Station: Arises from chair without difficulty. Stance is normal. Gait demonstrates normal stride length and balance . Able to heel, toe and tandem walk without difficulty.  Reflexes: 1+ and symmetric. Toes downgoing.      ASSESSMENT: 28 year lady with progressive mild memory difficulties  X 3 years-likely mild cognitive impairment with some component of  underlying anxiety/depression Mild Alzheimer`s dementia due to  significant progression over last 2 years but now response to Aricept and Namenda    PLAN:  I had a long discussion with the patient, son and daughter regarding her dementia and she appears quite stable on the current medication regime hence continue Aricept 10 mg daily and Namenda 5 mg twice daily as she was unable to tolerate a higher dose. I encouraged her to increase participation in mentally challenging activities like solving crossword puzzles, sudoku and playing bridge. She was also advised fall and safety precautions. She  was given written information to review about possible participation in [place] dementia trial if interested. She will return for follow-up in 6 months or call earlier if necessary.  Antony Contras, MD

## 2015-07-07 ENCOUNTER — Ambulatory Visit (INDEPENDENT_AMBULATORY_CARE_PROVIDER_SITE_OTHER): Payer: Medicare Other | Admitting: Family Medicine

## 2015-07-07 ENCOUNTER — Encounter: Payer: Self-pay | Admitting: Family Medicine

## 2015-07-07 VITALS — BP 110/70 | HR 69 | Temp 97.6°F | Resp 16 | Ht 67.0 in | Wt 140.0 lb

## 2015-07-07 DIAGNOSIS — J309 Allergic rhinitis, unspecified: Secondary | ICD-10-CM | POA: Insufficient documentation

## 2015-07-07 DIAGNOSIS — M25561 Pain in right knee: Secondary | ICD-10-CM

## 2015-07-07 DIAGNOSIS — R519 Headache, unspecified: Secondary | ICD-10-CM

## 2015-07-07 DIAGNOSIS — Z0000 Encounter for general adult medical examination without abnormal findings: Secondary | ICD-10-CM | POA: Diagnosis not present

## 2015-07-07 DIAGNOSIS — Z23 Encounter for immunization: Secondary | ICD-10-CM

## 2015-07-07 DIAGNOSIS — J302 Other seasonal allergic rhinitis: Secondary | ICD-10-CM

## 2015-07-07 DIAGNOSIS — R51 Headache: Secondary | ICD-10-CM | POA: Diagnosis not present

## 2015-07-07 DIAGNOSIS — G8929 Other chronic pain: Secondary | ICD-10-CM

## 2015-07-07 MED ORDER — FLUTICASONE PROPIONATE 50 MCG/ACT NA SUSP: 2.0000 | Freq: Every day | NASAL | Status: DC

## 2015-07-07 NOTE — Progress Notes (Signed)
Patient ID: Brandi Hanson, female   DOB: 20-Oct-1935, 79 y.o.   MRN: [id number]       Patient: Brandi Hanson, Female    DOB: Dec 01, 1935, 79 y.o.   MRN: [id number] Visit Date: 07/07/2015  Today's Provider: Margarita Rana, MD   Chief Complaint  Patient presents with  . Medicare Wellness   Subjective:    Annual wellness visit Brandi Hanson is a 79 y.o. female. She feels well. She reports exercising 3-5 times a week. She reports she is sleeping poorly.  04/03/14 CPE 06/04/14 Mammo-BI-RADS 2 06/30/13 Colon-Normal 11/17/14 BMD- EKG  Lab Results  Component Value Date   WBC 10.4* 01/15/2015   HGB 12.4 01/15/2015   HCT 36.8 01/15/2015   PLT 170 01/15/2015   GLUCOSE 103 01/15/2015   CHOL 174 10/29/2014   TRIG 119 10/29/2014   HDL 52 10/29/2014   LDLCALC 98 10/29/2014   ALT 15 01/15/2015   AST 14 01/15/2015   NA 142 01/15/2015   K 4.2 01/15/2015   CL 105 07/02/2013   CREATININE 0.7 01/15/2015   BUN 26.1* 01/15/2015   CO2 24 01/15/2015   TSH 1.13 07/20/2014   HGBA1C 5.8 04/09/2012    ----------------------------------------------------------- Headache: Patient complains of headache. She does have a headache at this time.   Description of Headaches: Location of pain: bilateral, temporal Radiation of pain?:none Character of pain:aching and throbbing Accompanying symptoms: vertigo Prodromal sx?: neck stiffness Rapidity of onset: gradual Typical duration of individual headache: several days Are most headaches similar in presentation? yes Typical precipitants: stress  Temporal Pattern of Headaches: Started having HAs a few weeks ago Awaken from sleep?: no Seasonal pattern?: no 'Clustering' of HAs over time? no Overall pattern since problem began: gradually worsening  Degree of Functional Impairment: mild  Current Use of Meds to Treat HA: Abortive meds? Tylenol Daily use? no  Additional Relevant History: History of head/neck trauma? no History of  head/neck surgery? no Family h/o headache problems? no Use of meds that might worsen HAs? no Exposure to carbon monoxide? no Substance use: none  Also having increased sinus symptoms and congestion as below.    For mood, followed by psychiatry.    Review of Systems  Constitutional: Positive for diaphoresis and activity change (crying, irritability).  HENT: Positive for drooling, hearing loss, postnasal drip, rhinorrhea, sinus pressure, sneezing and tinnitus.   Eyes: Positive for photophobia and itching.  Respiratory: Positive for apnea.   Cardiovascular: Positive for chest pain and palpitations.  Gastrointestinal: Positive for abdominal pain, diarrhea and constipation.  Endocrine: Negative.   Genitourinary: Positive for difficulty urinating and dyspareunia.  Musculoskeletal: Positive for myalgias, back pain, arthralgias and neck pain.  Skin: Negative.   Allergic/Immunologic: Positive for environmental allergies.  Neurological: Positive for dizziness, tremors, speech difficulty and headaches.  Hematological: Negative.   Psychiatric/Behavioral: Positive for confusion, sleep disturbance, decreased concentration and agitation. The patient is nervous/anxious.     Social History   Social History  . Marital Status: Single    Spouse Name: N/A  . Number of Children: N/A  . Years of Education: N/A   Occupational History  . Not on file.   Social History Main Topics  . Smoking status: Never Smoker   . Smokeless tobacco: Never Used  . Alcohol Use: No  . Drug Use: No  . Sexual Activity: Not on file   Other Topics Concern  . Not on file   Social History Narrative    Patient Active Problem List  Diagnosis Date Noted  . MCI (mild cognitive impairment) with memory loss 03/25/2015  . Depression, major, recurrent, mild ([place]) 03/25/2015  . Generalized anxiety disorder 03/25/2015  . IBS (irritable bowel syndrome) 02/15/2015  . At risk for falling 01/08/2015  . Body mass index  (BMI) of 23.0-23.9 in adult 01/08/2015  . H/O malignant neoplasm of breast 01/08/2015  . Chronic constipation 01/08/2015  . Clinical depression 01/08/2015  . Breast cancer, left breast ([place]) 01/08/2015  . HZV (herpes zoster virus) post herpetic neuralgia 01/08/2015  . Apnea, sleep 01/08/2015  . Palpitations 08/21/2013  . Memory loss 06/09/2013  . Mild cognitive impairment 06/09/2013  . Anxiety state, unspecified 06/09/2013  . Benign essential HTN 01/02/2012  . Hyperlipidemia 01/02/2012  . History of nipple discharge 12/04/2011  . Breast cancer in situ 07/22/2011  . Abnormal blood sugar 01/31/2010  . Adaptive colitis 04/21/2009  . Beat, premature ventricular 04/21/2009  . Absolute anemia 02/05/2009  . Anxiety 02/05/2009  . Acid reflux 02/05/2009  . Essential (primary) hypertension 02/05/2009  . Hypercholesteremia 02/05/2009  . Bone/cartilage disorder 07/22/2007  . Avitaminosis D 10/08/2006  . Carotid artery obstruction 09/11/1998    Past Surgical History  Procedure Laterality Date  . Abdominal hysterectomy  1978  . Breast biopsy  1990, 1994    left  . Foot surgery  2005  . Rotator cuff repair  2007  . Cataract extraction  2011    bilateral  . Kidney stone surgery  2011  . Breast cancer  10/2010    ductal carcinoma in situ, left breast    Her family history includes Cancer in her father, maternal grandfather, mother, and sister; Crohn's disease in her mother; Heart disease in her maternal grandmother, paternal grandfather, and paternal grandmother; Hypertension in her father, maternal grandfather, and sister.    Previous Medications   ACETAMINOPHEN (TYLENOL) 500 MG TABLET    Take 500 mg by mouth every 6 (six) hours as needed.   ASPIRIN 81 MG TABLET    ASPIRIN EC LOW DOSE, 81MG  (Oral Tablet Delayed Release)  1 Every Day for 0 days  Quantity: 0.00;  Refills: 0   Ordered :12-May-2010  Abran Richard ;  Started 05-Feb-2009 Active Comments: DX: 272.0   CALCIUM CARBONATE (TUMS -  DOSED IN MG ELEMENTAL CALCIUM) 500 MG CHEWABLE TABLET    Chew 1 tablet by mouth. PRN   CALCIUM CARBONATE-VITAMIN D 600-200 MG-UNIT TABS    CALCIUM + D, 600-200MG -UNIT (Oral Tablet)  1 Twice Daily for 0 days  Quantity: 0.00;  Refills: 0   Ordered :12-May-2010  Abran Richard ;  Started 05-Feb-2009 Active Comments: DX: 733.90   CHOLECALCIFEROL (VITAMIN D3) 1000 UNITS CAPS    Take by mouth.   CLIDINIUM-CHLORDIAZEPOXIDE (LIBRAX) 5-2.5 MG PER CAPSULE    TAKE 1 OR 2 CAPSULES BY MOUTH 3 TIMES A DAY AS NEEDED   DONEPEZIL (ARICEPT) 5 MG TABLET    Take 2 tablets (10 mg total) by mouth at bedtime.   LETROZOLE (FEMARA) 2.5 MG TABLET    Take by mouth.   LISINOPRIL (PRINIVIL,ZESTRIL) 5 MG TABLET    Take 1 tablet (5 mg total) by mouth daily.   MEMANTINE (NAMENDA) 5 MG TABLET    Take 1 tablet (5 mg total) by mouth 2 (two) times daily.   POTASSIUM CITRATE (UROCIT-K) 10 MEQ (1080 MG) SR TABLET    Take 10 mEq by mouth. BID   PROPRANOLOL (INDERAL) 20 MG TABLET    Take 2 tablets (40 mg total) by mouth  2 (two) times daily.   ROSUVASTATIN (CRESTOR) 5 MG TABLET    Take 1 tablet (5 mg total) by mouth at bedtime.   SENNOSIDES (SENNA LAX PO)    Take 1 tablet by mouth as needed.    Patient Care Team: Margarita Rana, MD as PCP - General (Family Medicine)     Objective:   Vitals: BP 110/70 mmHg  Pulse 69  Temp(Src) 97.6 F (36.4 C) (Oral)  Resp 16  Ht 5\' 7"  (1.702 m)  Wt 140 lb (63.504 kg)  BMI 21.92 kg/m2  SpO2 98%  Physical Exam  Constitutional: She is oriented to person, place, and time. She appears well-developed and well-nourished.  HENT:  Head: Normocephalic and atraumatic.  Right Ear: Tympanic membrane, external ear and ear canal normal.  Left Ear: Tympanic membrane, external ear and ear canal normal.  Nose: Nose normal.  Mouth/Throat: Uvula is midline, oropharynx is clear and moist and mucous membranes are normal.  Eyes: Conjunctivae, EOM and lids are normal. Pupils are equal, round, and reactive to  light.  Neck: Trachea normal and normal range of motion. Neck supple. Carotid bruit is not present. No thyroid mass and no thyromegaly present.  Cardiovascular: Normal rate, regular rhythm and normal heart sounds.   Pulmonary/Chest: Effort normal and breath sounds normal.  Well healed surgical scars. And radiation tattoos   Abdominal: Soft. Normal appearance and bowel sounds are normal. There is no hepatosplenomegaly. There is no tenderness.  Genitourinary: No breast swelling, tenderness or discharge.  Musculoskeletal: Normal range of motion.       Right knee: She exhibits swelling.  Lymphadenopathy:    She has no cervical adenopathy.    She has no axillary adenopathy.  Neurological: She is alert and oriented to person, place, and time. She has normal strength. No cranial nerve deficit.  Skin: Skin is warm, dry and intact.  Psychiatric: She has a normal mood and affect. Her speech is normal and behavior is normal. Judgment and thought content normal. Cognition and memory are normal.    Activities of Daily Living In your present state of health, do you have any difficulty performing the following activities: 07/07/2015  Hearing? Y  Vision? N  Difficulty concentrating or making decisions? Y  Walking or climbing stairs? Y  Dressing or bathing? N  Doing errands, shopping? N    Fall Risk Assessment Fall Risk  07/07/2015 07/07/2015 06/23/2015 06/23/2015 03/09/2015  Falls in the past year? No No Yes No Yes  Number falls in past yr: - - 1 - 1  Injury with Fall? - - No - -  Risk for fall due to : - - Impaired balance/gait - -  Follow up - - Falls prevention discussed;Education provided - -     Depression Screen PHQ 2/9 Scores 07/07/2015 06/23/2015 06/16/2015  PHQ - 2 Score 6 0 2  PHQ- 9 Score 12 - 8    Cognitive Testing - 6-CIT  Correct? Score   What year is it? yes 0 0 or 4  What month is it? yes 0 0 or 3  Memorize:    Pia Mau,  44,  [place],      What time is  it? (within 1 hour) yes 0 0 or 3  Count backwards from 20 yes 0 0, 2, or 4  Name the months of the year yes 0 0, 2, or 4  Repeat name & address above yes 2 0, 2, 4, 6, 8, or 10  TOTAL SCORE  2/28   Interpretation:  Normal  Normal (0-7) Abnormal (8-28)       Assessment & Plan:     Annual Wellness Visit  Reviewed patient's Family Medical History Reviewed and updated list of patient's medical providers Assessment of cognitive impairment was done Assessed patient's functional ability Established a written schedule for health screening [place] Completed and Reviewed  Exercise Activities and Dietary recommendations Goals    . Exercise 150 minutes per week (moderate activity)       Immunization History  Administered Date(s) Administered  . Influenza, High Dose Seasonal PF 06/08/2014  . Influenza-Unspecified 05/16/2013  . Pneumococcal Conjugate-13 10/28/2014  . Pneumococcal Polysaccharide-23 07/10/2003  . Td 03/29/2007  . Zoster 07/10/2007    Health Maintenance  Topic Date Due  . INFLUENZA VACCINE  04/12/2015  . TETANUS/TDAP  03/28/2017  . DEXA SCAN  Completed  . ZOSTAVAX  Completed  . PNA vac Low Risk Adult  Completed         1. Medicare annual wellness visit, subsequent Stable. Patient advised to continue eating healthy and exercise daily.  2. Need for influenza vaccination - Flu vaccine HIGH DOSE PF  3. Headache, worsening Worsening. F/U pending lab report. Will rule out temporal arteritis.   - Sedimentation rate  4. Other seasonal allergic rhinitis Worsening. Patient started on fluticasone 50 MCG as below. - fluticasone (FLONASE) 50 MCG/ACT nasal spray; Place 2 sprays into both nostrils daily.  Dispense: 16 g; Refill: 6   Patient was seen and examined by Jerrell Belfast, MD, and note scribed by Lynford Humphrey, [place].  I have reviewed the document for accuracy and completeness and I agree with above. Jerrell Belfast, MD    Margarita Rana, MD      .s   ------------------------------------------------------------------------------------------------------------

## 2015-07-08 ENCOUNTER — Ambulatory Visit (INDEPENDENT_AMBULATORY_CARE_PROVIDER_SITE_OTHER): Payer: No Typology Code available for payment source | Admitting: Licensed Clinical Social Worker

## 2015-07-08 ENCOUNTER — Telehealth: Payer: Self-pay

## 2015-07-08 DIAGNOSIS — F330 Major depressive disorder, recurrent, mild: Secondary | ICD-10-CM

## 2015-07-08 DIAGNOSIS — G3184 Mild cognitive impairment, so stated: Secondary | ICD-10-CM

## 2015-07-08 DIAGNOSIS — F411 Generalized anxiety disorder: Secondary | ICD-10-CM | POA: Diagnosis not present

## 2015-07-08 LAB — SEDIMENTATION RATE: SED RATE: 9 mm/h (ref 0–40)

## 2015-07-08 NOTE — Telephone Encounter (Signed)
[name] advised.   Thanks,   -Mickel Baas

## 2015-07-08 NOTE — Progress Notes (Signed)
THERAPIST PROGRESS NOTE  Session Time: 11:00 a.m. -  12:20 p.m.  Participation Level: Active  Behavioral Response: NeatAlertAnxious and Depressed  Type of Therapy: Family Therapy  Treatment Goals addressed: Anxiety, Communication: assertiveness and conflict resolution and Coping  Interventions: Solution Focused, Strength-based, Supportive, Family Systems and Reframing  Summary: Brandi Hanson is a 79 y.o. female  who presents with ongoing symptoms of anxiety, self admitted worry and loneliness and mild depressive symptoms that she experiences as disappointment and regrets along with episodic feelings of guilt and shame when she becomes upset with her sister who is very involved in client's life almost daily since they live near one another.  Brandi Hanson continues to drive and her sister, Brandi Hanson, usually accompanies her to appointments.  Today, LCSW had both to participate in session together since one of client's biggest stressors is how she feels about how her sister talks to her.  Client able to share with her sister how hurt she becomes when her sister says certain things or what client experiences as "mimicking" her when she smiles.  Her sister admits that she was not aware of the impact of this and also voiced her own areas of frustration when client does not want to participate in activities and expressed her own feelings of guilt if she does not include Brandi Hanson in some events.   Both were in agreement that client's anxiety as evidenced by frequent worrying and depressive symptoms seem to be getting worse.  She is not on an anti-depressant and did not think that she was taking anything for anxiety either.  At this time, Brandi Hanson is maintaining her personal hygiene, driving, getting involved with some social events with friends that she worked with and attending Church regularly.  No difficulties managing household chores/tasks were reported.  She is able to enjoy herself in most of these activities yet  admits that she gets bored and would prefer not to visit people in local nursing homes which has been something she and her sister have done together in the past.  Client's sister acknowledged that she has observed Brandi Hanson less interested in doing this.  Brandi Hanson became slightly irritable when her sister commented about a conversation she had with several of Brandi Hanson's friends/former co-workers.  She expressed her frustration appropriately and asked her sister not to talk about her with her friends anymore.  Brandi Hanson, client's sister stated "We''re just all concerned about you."  She attempted to reassure client that the conversation was not intended to be negative or harmful.  Both with insight that there are occasions when Brandi Hanson gets lost in a conversation or has a difficult time keeping up with the flow of the conversation.  Additional request made of her sister per client is that she try to talk and move a little slower since it is difficult to keep up with her.  Feeling rushed or expected to do things a certain way contributes to her anxiety and depression/hopelessness per Brandi Hanson.  Family dynamics were discussed and the strain that often results due to having to rely on her sister and sister's family more because she is not married and without children of her own.  Brandi Hanson opened up to sister to explain the sources of her worry, depression and guilt. Per client, her anxiety is about confronting her sister about how when Brandi Hanson says certain things that the result is client has a negative view of herself and that client experiences what her sister is saying as judgmental or critical of her.  Brandi Hanson, client's sister, apologized and offered her view of client's behaviors and with some effort to understand where client is coming from yet a bit dismissive of her reactions towards client.  Client voiced understanding about LCSW's resignation and expressed appropriate disappointment with the news as did client's sister with both  expressing belief that coming to talk to LCSW has been helpful.  Both voiced understanding that Brandi Hanson could assist with OPT referral to another provider or client could remain in this clinic and see LCSW, Brandi Hanson.  At this time client expressed an interest in resuming OPT services with LCSW at a future date upon completion of insurance contract/credentialing. She and her sister are aware that there is another OPT provider, Brandi Hanson, in this clinic as well and that there could be a delay or gap in OPT if client decides to continue services with this LCSW.  Suicidal/Homicidal: Negativewithout intent/plan  Therapist Response:   Supportive psychotherapy with insight was provided to client and her sister. Also offered both of them much emotional support and encouragement and commended the two of them for allowing LCSW to have this conversation with them.  Assisted both Brandi Hanson and her sister, Brandi Hanson, to begin considering certain patterns of thinking and reviewed common irrational beliefs that often interfere with healthy communication in relationships. Assist in developing awareness of cognitive messages that reinforce hopelessness and helplessness. LCSW vallidated client's & sister's feelings and thoughts given the change in their roles and the inter-play of their relationship as a result of Brandi Hanson's cognitive changes.  LCSW gently pointed out that ongoing conversations like today's with a therapist facilitating the conversation can lead to both having less stress and a happier/healthier relationship.    LCSW reviewed with client and her sister that 07/16/15, will be LCSW's last day and discussed additional options for OPT including continuing services with this LCSW at another location pending insurance credentialing. Explained that there could be a delay in OPT with this LCSW and reinforced the importance of continuity of care and client having someone in the interim to talk to such as Brandi Hanson in this  clinic.  Thanked them both for trusting LCSW and being open to the therapeutic process and offered much reassurance and encouragement about change as a real option to promote health and happiness.  Plan: Return again PRN. Termination of therapeutic relationship at this clinic.  Client and her sister, Brandi Hanson, are aware that they can call to clinic in between appointments as needed and will see Brandi Piedra, LCSW for OPT PRN.  LCSW will discuss case with Dr. Jimmye Norman regarding appropriateness of medications to address mild depression and anxiety symptoms.  Diagnosis: Mild Cognitive Impairment with memory loss   Generalized anxiety disorder   Major Depressive disorder, recurrent, mild  Miguel Dibble, LCSW 07/08/2015

## 2015-07-08 NOTE — Telephone Encounter (Signed)
Error

## 2015-07-08 NOTE — Telephone Encounter (Signed)
-----   Message from Margarita Rana, MD sent at 07/08/2015  6:35 AM EDT ----- Sed rate normal. Please notify patient's sister. Thanks.

## 2015-07-22 ENCOUNTER — Other Ambulatory Visit: Payer: Self-pay | Admitting: Cardiovascular Disease

## 2015-07-22 MED ORDER — PROPRANOLOL HCL 20 MG PO TABS: 40.0000 mg | ORAL_TABLET | Freq: Two times a day (BID) | ORAL | Status: DC

## 2015-08-09 ENCOUNTER — Other Ambulatory Visit: Payer: Self-pay | Admitting: Neurology

## 2015-08-09 ENCOUNTER — Other Ambulatory Visit: Payer: Self-pay | Admitting: Family Medicine

## 2015-08-09 DIAGNOSIS — E785 Hyperlipidemia, unspecified: Secondary | ICD-10-CM

## 2015-08-09 DIAGNOSIS — F419 Anxiety disorder, unspecified: Secondary | ICD-10-CM

## 2015-08-18 ENCOUNTER — Ambulatory Visit (INDEPENDENT_AMBULATORY_CARE_PROVIDER_SITE_OTHER): Payer: Medicare Other | Admitting: Cardiovascular Disease

## 2015-08-18 ENCOUNTER — Encounter: Payer: Self-pay | Admitting: Cardiovascular Disease

## 2015-08-18 VITALS — BP 126/78 | HR 69 | Resp 16 | Ht 67.0 in | Wt 141.3 lb

## 2015-08-18 DIAGNOSIS — I493 Ventricular premature depolarization: Secondary | ICD-10-CM | POA: Diagnosis not present

## 2015-08-18 DIAGNOSIS — I6521 Occlusion and stenosis of right carotid artery: Secondary | ICD-10-CM

## 2015-08-18 DIAGNOSIS — E7800 Pure hypercholesterolemia, unspecified: Secondary | ICD-10-CM | POA: Diagnosis not present

## 2015-08-18 DIAGNOSIS — I10 Essential (primary) hypertension: Secondary | ICD-10-CM | POA: Diagnosis not present

## 2015-08-18 NOTE — Patient Instructions (Signed)
Your physician has recommended you make the following change in your medication: STOP CRESTOR  Dr. Sallyanne Kuster recommends that you schedule a follow-up appointment in: [place]

## 2015-08-18 NOTE — Progress Notes (Signed)
Patient ID: Brandi Hanson, female   DOB: [birth year], 79 y.o.   MRN: [id number]     Cardiology Office Note   Date:  08/20/2015   ID:  JUDEE MOLENAAR, DOB [birth year], MRN [id number]  PCP:  Margarita Rana, MD  Cardiologist:   Sanda Klein, MD   Chief Complaint  Patient presents with  . Annual Exam     occassional chest pain,occassional shortness of breath, no edema, has pain or cramping in leg, occassional ligheaheaeded or dizziness      History of Present Illness: Brandi Hanson is a 79 y.o. female who presents for  Palpitations and hypertension. She has occasional dizziness and lightheadedness occasional cramping in her leg and occasional dyspnea that is not associated with physical activity. She denies anginal chest pain. She is now taking her setting only 3 days a week secondary to musculoskeletal complaints. She has worsening memory problems.    Past Medical History  Diagnosis Date  . Hypertension   . PVC (premature ventricular contraction)   . Hyperlipidemia   . Arthritis   . Sleep apnea   . GERD (gastroesophageal reflux disease)   . Cancer ([place])     skin  . Anxiety   . Osteoporosis   . Abdominal pain   . Abdominal distention   . Palpitations   . Generalized headaches   . Confusion   . Rectal pain   . Breast cancer in situ 07/22/2011  . Benign essential HTN 01/02/2012  . Hyperlipidemia 01/02/2012  . Memory loss   . Depression     Past Surgical History  Procedure Laterality Date  . Abdominal hysterectomy  1978  . Breast biopsy  1990, 1994    left  . Foot surgery  2005  . Rotator cuff repair  2007  . Cataract extraction  2011    bilateral  . Kidney stone surgery  2011  . Breast cancer  10/2010    ductal carcinoma in situ, left breast     Current Outpatient Prescriptions  Medication Sig Dispense Refill  . acetaminophen (TYLENOL) 500 MG tablet Take 500 mg by mouth every 6 (six) hours as needed.    Marland Kitchen aspirin 81 MG tablet ASPIRIN EC LOW DOSE, 81MG  (Oral  Tablet Delayed Release)  1 Every Day for 0 days  Quantity: 0.00;  Refills: 0   Ordered :12-May-2010  Abran Richard ;  Started 05-Feb-2009 Active Comments: DX: 272.0    . calcium carbonate (TUMS - DOSED IN MG ELEMENTAL CALCIUM) 500 MG chewable tablet Chew 1 tablet by mouth. PRN    . Calcium Carbonate-Vitamin D 600-200 MG-UNIT TABS CALCIUM + D, 600-200MG -UNIT (Oral Tablet)  1 Twice Daily for 0 days  Quantity: 0.00;  Refills: 0   Ordered :12-May-2010  Abran Richard ;  Started 05-Feb-2009 Active Comments: DX: 733.90    . Cholecalciferol (VITAMIN D3) 1000 UNITS CAPS Take by mouth.    . clidinium-chlordiazePOXIDE (LIBRAX) 5-2.5 MG capsule TAKE 1 OR 2 CAPSULES BY MOUTH 3 TIMES A DAY AS NEEDED 540 capsule 1  . donepezil (ARICEPT) 5 MG tablet TAKE 2 TABLETS BY MOUTH AT BEDTIME 180 tablet 1  . fluticasone (FLONASE) 50 MCG/ACT nasal spray Place 2 sprays into both nostrils daily. 16 g 6  . letrozole (FEMARA) 2.5 MG tablet Take by mouth.    Marland Kitchen lisinopril (PRINIVIL,ZESTRIL) 5 MG tablet Take 1 tablet (5 mg total) by mouth daily. 90 tablet 3  . memantine (NAMENDA) 5 MG tablet Take 1 tablet (5 mg total) by mouth  2 (two) times daily. 180 tablet 3  . potassium citrate (UROCIT-K) 10 MEQ (1080 MG) SR tablet Take 10 mEq by mouth. BID    . propranolol (INDERAL) 20 MG tablet Take 2 tablets (40 mg total) by mouth 2 (two) times daily. 120 tablet 0  . Sennosides (SENNA LAX PO) Take 1 tablet by mouth as needed.     No current facility-administered medications for this visit.    Allergies:   Sulfa antibiotics    Social History:  The patient  reports that she has never smoked. She has never used smokeless tobacco. She reports that she does not drink alcohol or use illicit drugs.   Family History:  The patient's family history includes Cancer in her father, maternal grandfather, mother, and sister; Crohn's disease in her mother; Heart disease in her maternal grandmother, paternal grandfather, and paternal grandmother;  Hypertension in her father, maternal grandfather, and sister.    ROS:  Please see the history of present illness.    Otherwise, review of systems positive for none.   All other systems are reviewed and negative.    PHYSICAL EXAM: VS:  BP 126/78 mmHg  Pulse 69  Resp 16  Ht 5\' 7"  (1.702 m)  Wt 141 lb 5 oz (64.099 kg)  BMI 22.13 kg/m2 , BMI Body mass index is 22.13 kg/(m^2).  General: Alert, oriented x3, no distress Head: no evidence of trauma, PERRL, EOMI, no exophtalmos or lid lag, no myxedema, no xanthelasma; normal ears, nose and oropharynx Neck: normal jugular venous pulsations and no hepatojugular reflux; brisk carotid pulses without delay and no carotid bruits Chest: clear to auscultation, no signs of consolidation by percussion or palpation, normal fremitus, symmetrical and full respiratory excursions Cardiovascular: normal position and quality of the apical impulse, regular rhythm, normal first and second heart sounds, no murmurs, rubs or gallops Abdomen: no tenderness or distention, no masses by palpation, no abnormal pulsatility or arterial bruits, normal bowel sounds, no hepatosplenomegaly Extremities: no clubbing, cyanosis or edema; 2+ radial, ulnar and brachial pulses bilaterally; 2+ right femoral, posterior tibial and dorsalis pedis pulses; 2+ left femoral, posterior tibial and dorsalis pedis pulses; no subclavian or femoral bruits Neurological: grossly nonfocal Psych: euthymic mood, full affect   EKG:  EKG is ordered today. The ekg ordered today demonstrates  Sinus rhythm with questionable left atrial abnormality   Recent Labs: 01/15/2015: ALT 15; BUN 26.1*; Creatinine 0.7; HGB 12.4; Platelets 170; Potassium 4.2; Sodium 142    Lipid Panel    Component Value Date/Time   CHOL 174 10/29/2014   TRIG 119 10/29/2014   HDL 52 10/29/2014   LDLCALC 98 10/29/2014      Wt Readings from Last 3 Encounters:  08/18/15 141 lb 5 oz (64.099 kg)  07/07/15 140 lb (63.504 kg)    06/23/15 139 lb (63.05 kg)    .   ASSESSMENT AND PLAN:  1.  Palpitations related to PVCs, not particularly prominent at this time. No change in therapy  2.  Hypertension, well controlled  3.  Carotid artery disease -  history of less than 50% right carotid artery stenosis by previous evaluation.  4.  Dementia. It is hard to say where the balance of risk/benefit for continue statin therapy lies. I'm concerned about the possible adverse effect of statin on her memory issues.  I have recommended that she stop Crestor, but also advised that she should discuss with her primary care physician and neurologist whether this medication should be continued.  Current medicines are reviewed at length with the patient today.  The patient does not have concerns regarding medicines.  The following changes have been made:  no change  Labs/ tests ordered today include:  Orders Placed This Encounter  Procedures  . EKG 12-Lead    Patient Instructions  Your physician has recommended you make the following change in your medication: STOP CRESTOR  Dr. Sallyanne Kuster recommends that you schedule a follow-up appointment in: ONE YEAR        SignedSanda Klein, MD  08/20/2015 10:50 AM    Sanda Klein, MD, [facility] [phone] office [phone] pager

## 2015-08-20 ENCOUNTER — Encounter: Payer: Self-pay | Admitting: Cardiovascular Disease

## 2015-09-14 ENCOUNTER — Other Ambulatory Visit: Payer: Self-pay | Admitting: Cardiovascular Disease

## 2015-09-14 MED ORDER — PROPRANOLOL HCL 20 MG PO TABS: 40.0000 mg | ORAL_TABLET | Freq: Two times a day (BID) | ORAL | Status: DC

## 2015-09-16 ENCOUNTER — Encounter: Payer: Self-pay | Admitting: Cardiovascular Disease

## 2015-09-16 NOTE — Telephone Encounter (Signed)
This encounter was created in error - please disregard.

## 2015-09-17 ENCOUNTER — Other Ambulatory Visit: Payer: Self-pay | Admitting: *Deleted

## 2015-09-17 MED ORDER — PROPRANOLOL HCL 20 MG PO TABS: 40.0000 mg | ORAL_TABLET | Freq: Two times a day (BID) | ORAL | Status: DC

## 2015-10-04 ENCOUNTER — Other Ambulatory Visit: Payer: Self-pay | Admitting: *Deleted

## 2015-10-04 DIAGNOSIS — R0781 Pleurodynia: Secondary | ICD-10-CM

## 2015-10-04 MED ORDER — LISINOPRIL 5 MG PO TABS: 5.0000 mg | ORAL_TABLET | Freq: Every day | ORAL | Status: DC

## 2015-12-22 ENCOUNTER — Ambulatory Visit: Payer: Medicare Other | Admitting: Neurology

## 2016-01-05 ENCOUNTER — Ambulatory Visit (INDEPENDENT_AMBULATORY_CARE_PROVIDER_SITE_OTHER): Payer: Medicare Other | Admitting: Family Medicine

## 2016-01-05 ENCOUNTER — Encounter: Payer: Self-pay | Admitting: Family Medicine

## 2016-01-05 VITALS — BP 122/74 | HR 84 | Temp 97.9°F | Resp 16 | Wt 141.0 lb

## 2016-01-05 DIAGNOSIS — G3184 Mild cognitive impairment, so stated: Secondary | ICD-10-CM | POA: Diagnosis not present

## 2016-01-05 DIAGNOSIS — K589 Irritable bowel syndrome without diarrhea: Secondary | ICD-10-CM | POA: Diagnosis not present

## 2016-01-05 DIAGNOSIS — F411 Generalized anxiety disorder: Secondary | ICD-10-CM

## 2016-01-05 DIAGNOSIS — I10 Essential (primary) hypertension: Secondary | ICD-10-CM

## 2016-01-05 DIAGNOSIS — N3090 Cystitis, unspecified without hematuria: Secondary | ICD-10-CM | POA: Diagnosis not present

## 2016-01-05 LAB — POCT URINALYSIS DIPSTICK
GLUCOSE UA: NEGATIVE
Ketones, UA: NEGATIVE
NITRITE UA: POSITIVE
PH UA: 6.5
RBC UA: NEGATIVE
SPEC GRAV UA: 1.015
Urobilinogen, UA: 0.2

## 2016-01-05 MED ORDER — AMOXICILLIN-POT CLAVULANATE 875-125 MG PO TABS: 1.0000 | ORAL_TABLET | Freq: Two times a day (BID) | ORAL | Status: DC

## 2016-01-05 NOTE — Progress Notes (Signed)
Subjective:    Patient ID: Brandi Hanson, female    DOB: Mar 14, 1936, 80 y.o.   MRN: [id number]  Hypertension This is a chronic problem. The problem is controlled. Associated symptoms include anxiety, chest pain (sometimes; sees cardiology), headaches, palpitations and shortness of breath (with anxiety). Pertinent negatives include no blurred vision, malaise/fatigue, neck pain, orthopnea or peripheral edema. Treatments tried: taking Propranolol 20 mg and Lisinopril 5 mg prescribed by cardiology. The current treatment provides significant improvement. There are no compliance problems.   Abdominal Pain This is a new problem. The problem occurs intermittently. The pain is located in the LLQ. The quality of the pain is a sensation of fullness (swelling). Associated symptoms include constipation, dysuria, frequency and headaches. Pertinent negatives include no diarrhea, hematochezia, hematuria or melena. Her past medical history is significant for irritable bowel syndrome.      Review of Systems  Constitutional: Negative for malaise/fatigue.  Eyes: Negative for blurred vision.  Respiratory: Positive for shortness of breath (with anxiety).   Cardiovascular: Positive for chest pain (sometimes; sees cardiology) and palpitations. Negative for orthopnea.  Gastrointestinal: Positive for abdominal pain and constipation. Negative for diarrhea, melena and hematochezia.  Genitourinary: Positive for dysuria and frequency. Negative for hematuria.  Musculoskeletal: Negative for neck pain.  Neurological: Positive for headaches.   BP 122/74 mmHg  Pulse 84  Temp(Src) 97.9 F (36.6 C) (Oral)  Resp 16  Wt 141 lb (63.957 kg)   Patient Active Problem List   Diagnosis Date Noted  . Allergic rhinitis 07/07/2015  . MCI (mild cognitive impairment) with memory loss 03/25/2015  . Depression, major, recurrent, mild ([place]) 03/25/2015  . Generalized anxiety disorder 03/25/2015  . IBS (irritable bowel syndrome)  02/15/2015  . At risk for falling 01/08/2015  . Body mass index (BMI) of 23.0-23.9 in adult 01/08/2015  . H/O malignant neoplasm of breast 01/08/2015  . Chronic constipation 01/08/2015  . Clinical depression 01/08/2015  . Breast cancer, left breast ([place]) 01/08/2015  . HZV (herpes zoster virus) post herpetic neuralgia 01/08/2015  . Apnea, sleep 01/08/2015  . Palpitations 08/21/2013  . Memory loss 06/09/2013  . Mild cognitive impairment 06/09/2013  . Anxiety state, unspecified 06/09/2013  . Benign essential HTN 01/02/2012  . Hyperlipidemia 01/02/2012  . History of nipple discharge 12/04/2011  . Breast cancer in situ 07/22/2011  . Abnormal blood sugar 01/31/2010  . Adaptive colitis 04/21/2009  . Beat, premature ventricular 04/21/2009  . Absolute anemia 02/05/2009  . Anxiety 02/05/2009  . Acid reflux 02/05/2009  . Essential (primary) hypertension 02/05/2009  . Hypercholesteremia 02/05/2009  . Bone/cartilage disorder 07/22/2007  . Avitaminosis D 10/08/2006  . Carotid artery obstruction 09/11/1998   Past Medical History  Diagnosis Date  . Hypertension   . PVC (premature ventricular contraction)   . Hyperlipidemia   . Arthritis   . Sleep apnea   . GERD (gastroesophageal reflux disease)   . Cancer ([place])     skin  . Anxiety   . Osteoporosis   . Abdominal pain   . Abdominal distention   . Palpitations   . Generalized headaches   . Confusion   . Rectal pain   . Breast cancer in situ 07/22/2011  . Benign essential HTN 01/02/2012  . Hyperlipidemia 01/02/2012  . Memory loss   . Depression    Current Outpatient Prescriptions on File Prior to Visit  Medication Sig  . acetaminophen (TYLENOL) 500 MG tablet Take 500 mg by mouth every 6 (six) hours as needed.  Marland Kitchen aspirin  81 MG tablet ASPIRIN EC LOW DOSE, 81MG  (Oral Tablet Delayed Release)  1 Every Day for 0 days  Quantity: 0.00;  Refills: 0   Ordered :12-May-2010  Abran Richard ;  Started 05-Feb-2009 Active Comments: DX: 272.0  .  calcium carbonate (TUMS - DOSED IN MG ELEMENTAL CALCIUM) 500 MG chewable tablet Chew 1 tablet by mouth. PRN  . Calcium Carbonate-Vitamin D 600-200 MG-UNIT TABS CALCIUM + D, 600-200MG -UNIT (Oral Tablet)  1 Twice Daily for 0 days  Quantity: 0.00;  Refills: 0   Ordered :12-May-2010  Abran Richard ;  Started 05-Feb-2009 Active Comments: DX: 733.90  . Cholecalciferol (VITAMIN D3) 1000 UNITS CAPS Take by mouth.  . clidinium-chlordiazePOXIDE (LIBRAX) 5-2.5 MG capsule TAKE 1 OR 2 CAPSULES BY MOUTH 3 TIMES A DAY AS NEEDED  . donepezil (ARICEPT) 5 MG tablet TAKE 2 TABLETS BY MOUTH AT BEDTIME  . letrozole (FEMARA) 2.5 MG tablet Take by mouth.  Marland Kitchen lisinopril (PRINIVIL,ZESTRIL) 5 MG tablet Take 1 tablet (5 mg total) by mouth daily.  . memantine (NAMENDA) 5 MG tablet Take 1 tablet (5 mg total) by mouth 2 (two) times daily.  . potassium citrate (UROCIT-K) 10 MEQ (1080 MG) SR tablet Take 10 mEq by mouth. BID  . propranolol (INDERAL) 20 MG tablet Take 2 tablets (40 mg total) by mouth 2 (two) times daily.  . fluticasone (FLONASE) 50 MCG/ACT nasal spray Place 2 sprays into both nostrils daily. (Patient not taking: Reported on 01/05/2016)  . Sennosides (SENNA LAX PO) Take 1 tablet by mouth as needed. Reported on 01/05/2016   No current facility-administered medications on file prior to visit.   Allergies  Allergen Reactions  . Sulfa Antibiotics Nausea Only   Past Surgical History  Procedure Laterality Date  . Abdominal hysterectomy  1978  . Breast biopsy  1990, 1994    left  . Foot surgery  2005  . Rotator cuff repair  2007  . Cataract extraction  2011    bilateral  . Kidney stone surgery  2011  . Breast cancer  10/2010    ductal carcinoma in situ, left breast   Social History   Social History  . Marital Status: Single    Spouse Name: N/A  . Number of Children: N/A  . Years of Education: N/A   Occupational History  . Not on file.   Social History Main Topics  . Smoking status: Never Smoker   .  Smokeless tobacco: Never Used  . Alcohol Use: No  . Drug Use: No  . Sexual Activity: Not on file   Other Topics Concern  . Not on file   Social History Narrative   Family History  Problem Relation Age of Onset  . Cancer Mother     breast  . Crohn's disease Mother   . Cancer Father     lung  . Hypertension Father   . Cancer Sister     breast and uterine  . Hypertension Sister   . Heart disease Maternal Grandmother   . Cancer Maternal Grandfather     colon  . Hypertension Maternal Grandfather   . Heart disease Paternal Grandmother   . Heart disease Paternal Grandfather        Objective:   Physical Exam  Constitutional: She appears well-developed and well-nourished.  Cardiovascular: Normal rate, regular rhythm and normal heart sounds.   Pulmonary/Chest: Effort normal and breath sounds normal. No respiratory distress.  Abdominal: Soft. Bowel sounds are normal.      Assessment & Plan:  1. Cystitis Recurrent. Will treat and refer back to urology this summer.  Patient instructed to call back if condition worsens or does not improve.    - POCT urinalysis dipstick - Urine culture Results for orders placed or performed in visit on 01/05/16  POCT urinalysis dipstick  Result Value Ref Range   Color, UA yellow    Clarity, UA cloudy    Glucose, UA negative    Bilirubin, UA small    Ketones, UA negative    Spec Grav, UA 1.015    Blood, UA negative    pH, UA 6.5    Protein, UA trace    Urobilinogen, UA 0.2    Nitrite, UA positive    Leukocytes, UA moderate (2+) (A) Negative   - amoxicillin-clavulanate (AUGMENTIN) 875-125 MG tablet; Take 1 tablet by mouth 2 (two) times daily.  Dispense: 14 tablet; Refill: 0  2. Benign essential HTN Condition is stable. Please continue current medication and  plan of care as noted.  Follow up with Dr. Rosanna Randy for Wellness.  Her nephew is Dispensing optician.   - CBC with Differential/Platelet - Comprehensive metabolic panel   3. IBS (irritable  bowel syndrome) Stable Continue Librax as needed.   4. Generalized anxiety disorder Stable.  Continue current medication. Does worry about medical illnesses.   5. MCI (mild cognitive impairment) with memory loss Continue current medication and follow up with Dr. Leonie Man.   Patient seen and examined by Jerrell Belfast, MD, and note scribed by Renaldo Fiddler, CMA.  I have reviewed the document for accuracy and completeness and I agree with above. Jerrell Belfast, MD   Margarita Rana, MD

## 2016-01-07 ENCOUNTER — Telehealth: Payer: Self-pay

## 2016-01-07 LAB — URINE CULTURE

## 2016-01-07 NOTE — Telephone Encounter (Signed)
LMTCB 01/07/2016  Thanks,   -Deniel Mcquiston  

## 2016-01-07 NOTE — Telephone Encounter (Signed)
-----   Message from Margarita Rana, MD sent at 01/07/2016 12:46 PM EDT ----- Did have infection. Sensitive to Augmentin.  Please notify patient. Thanks.

## 2016-01-07 NOTE — Telephone Encounter (Signed)
Pt advised.   Thanks,   -Billie Intriago  

## 2016-01-19 ENCOUNTER — Other Ambulatory Visit: Payer: Self-pay | Admitting: *Deleted

## 2016-01-19 DIAGNOSIS — Z853 Personal history of malignant neoplasm of breast: Secondary | ICD-10-CM

## 2016-01-20 ENCOUNTER — Encounter: Payer: Self-pay | Admitting: Hematology and Oncology

## 2016-01-20 ENCOUNTER — Other Ambulatory Visit (HOSPITAL_BASED_OUTPATIENT_CLINIC_OR_DEPARTMENT_OTHER): Payer: Medicare Other

## 2016-01-20 ENCOUNTER — Ambulatory Visit (HOSPITAL_BASED_OUTPATIENT_CLINIC_OR_DEPARTMENT_OTHER): Payer: Medicare Other | Admitting: Hematology and Oncology

## 2016-01-20 VITALS — BP 147/88 | HR 72 | Temp 97.7°F | Resp 18 | Ht 67.0 in | Wt 140.7 lb

## 2016-01-20 DIAGNOSIS — M8580 Other specified disorders of bone density and structure, unspecified site: Secondary | ICD-10-CM | POA: Diagnosis not present

## 2016-01-20 DIAGNOSIS — Z853 Personal history of malignant neoplasm of breast: Secondary | ICD-10-CM

## 2016-01-20 DIAGNOSIS — C50412 Malignant neoplasm of upper-outer quadrant of left female breast: Secondary | ICD-10-CM

## 2016-01-20 LAB — CBC WITH DIFFERENTIAL/PLATELET
BASO%: 0.4 % (ref 0.0–2.0)
Basophils Absolute: 0 10*3/uL (ref 0.0–0.1)
EOS%: 1.4 % (ref 0.0–7.0)
Eosinophils Absolute: 0.1 10*3/uL (ref 0.0–0.5)
HCT: 36.1 % (ref 34.8–46.6)
HEMOGLOBIN: 12.2 g/dL (ref 11.6–15.9)
LYMPH#: 1 10*3/uL (ref 0.9–3.3)
LYMPH%: 19.1 % (ref 14.0–49.7)
MCH: 30.1 pg (ref 25.1–34.0)
MCHC: 33.8 g/dL (ref 31.5–36.0)
MCV: 89.1 fL (ref 79.5–101.0)
MONO#: 0.7 10*3/uL (ref 0.1–0.9)
MONO%: 14.4 % — ABNORMAL HIGH (ref 0.0–14.0)
NEUT%: 64.7 % (ref 38.4–76.8)
NEUTROS ABS: 3.3 10*3/uL (ref 1.5–6.5)
PLATELETS: 168 10*3/uL (ref 145–400)
RBC: 4.05 10*6/uL (ref 3.70–5.45)
RDW: 12.8 % (ref 11.2–14.5)
WBC: 5.1 10*3/uL (ref 3.9–10.3)

## 2016-01-20 LAB — COMPREHENSIVE METABOLIC PANEL
ALBUMIN: 3.6 g/dL (ref 3.5–5.0)
ALT: 12 U/L (ref 0–55)
ANION GAP: 5 meq/L (ref 3–11)
AST: 13 U/L (ref 5–34)
Alkaline Phosphatase: 75 U/L (ref 40–150)
BILIRUBIN TOTAL: 0.47 mg/dL (ref 0.20–1.20)
BUN: 20.7 mg/dL (ref 7.0–26.0)
CO2: 30 meq/L — AB (ref 22–29)
CREATININE: 0.7 mg/dL (ref 0.6–1.1)
Calcium: 9.5 mg/dL (ref 8.4–10.4)
Chloride: 106 mEq/L (ref 98–109)
EGFR: 80 mL/min/{1.73_m2} — ABNORMAL LOW (ref >90)
Glucose: 101 mg/dl (ref 70–140)
Potassium: 4.3 mEq/L (ref 3.5–5.1)
Sodium: 141 mEq/L (ref 136–145)
TOTAL PROTEIN: 6.4 g/dL (ref 6.4–8.3)

## 2016-01-20 NOTE — Assessment & Plan Note (Signed)
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 There is no data for extended adjuvant therapy for DCIS.  Breast Cancer Surveillance: 1. Breast exam 01/20/2016: Normal 2. Mammogram 06/09/2015 No abnormalities. Postsurgical changes. Breast Density Category B. I recommended that she get 3-D mammograms for surveillance. Discussed the differences between different breast density categories.  Hypertension: Follow-up with primary care Osteopenia: DEXA scan March 2016  Return to clinic in 1 year for follow-up with survivorship

## 2016-01-20 NOTE — Progress Notes (Signed)
Patient Care Team: Margarita Rana, MD as PCP - General (Family Medicine)  DIAGNOSIS: DCIS left breast  SUMMARY OF ONCOLOGIC HISTORY:   Breast cancer of upper-outer quadrant of left female breast ([place])   10/19/2010 Surgery Left breast lumpectomy: High-grade DCIS 1.5 cm, necrosis present, ER 95%, PR 0%   11/30/2010 - 01/13/2011 Radiation Therapy Adjuvant radiation therapy   01/20/2011 - 01/20/2016 Anti-estrogen oral therapy Femara 5 years    CHIEF COMPLIANT: Follow-up on Femara  INTERVAL HISTORY: Brandi Hanson is a 80 year old lady with above-mentioned history of left breast DCIS who had lumpectomy radiation and completed 5 years of antiestrogen therapy with Femara. She is here to discuss stopping therapy. She reports no major problem is occasional hot flashes. She denies any lumps or nodules in the breasts. She stays reasonably active with exercising with her church group.  REVIEW OF SYSTEMS:   Constitutional: Denies fevers, chills or abnormal weight loss Eyes: Denies blurriness of vision Ears, nose, mouth, throat, and face: Denies mucositis or sore throat Respiratory: Denies cough, dyspnea or wheezes Cardiovascular: Denies palpitation, chest discomfort Gastrointestinal:  Denies nausea, heartburn or change in bowel habits Skin: Denies abnormal skin rashes Lymphatics: Denies new lymphadenopathy or easy bruising Neurological:Denies numbness, tingling or new weaknesses Behavioral/Psych: Mood is stable, no new changes  Extremities: No lower extremity edema Breast:  denies any pain or lumps or nodules in either breasts All other systems were reviewed with the patient and are negative.  I have reviewed the past medical history, past surgical history, social history and family history with the patient and they are unchanged from previous note.  ALLERGIES:  is allergic to sulfa antibiotics.  MEDICATIONS:  Current Outpatient Prescriptions  Medication Sig Dispense Refill  . acetaminophen  (TYLENOL) 500 MG tablet Take 500 mg by mouth every 6 (six) hours as needed.    Marland Kitchen amoxicillin-clavulanate (AUGMENTIN) 875-125 MG tablet Take 1 tablet by mouth 2 (two) times daily. 14 tablet 0  . aspirin 81 MG tablet ASPIRIN EC LOW DOSE, 81MG  (Oral Tablet Delayed Release)  1 Every Day for 0 days  Quantity: 0.00;  Refills: 0   Ordered :12-May-2010  Abran Richard ;  Started 05-Feb-2009 Active Comments: DX: 272.0    . calcium carbonate (TUMS - DOSED IN MG ELEMENTAL CALCIUM) 500 MG chewable tablet Chew 1 tablet by mouth. PRN    . Cholecalciferol (VITAMIN D3) 1000 UNITS CAPS Take 2 capsules by mouth daily.    . clidinium-chlordiazePOXIDE (LIBRAX) 5-2.5 MG capsule TAKE 1 OR 2 CAPSULES BY MOUTH 3 TIMES A DAY AS NEEDED 540 capsule 1  . donepezil (ARICEPT) 5 MG tablet TAKE 2 TABLETS BY MOUTH AT BEDTIME 180 tablet 1  . fluticasone (FLONASE) 50 MCG/ACT nasal spray Place 2 sprays into both nostrils daily. (Patient not taking: Reported on 01/05/2016) 16 g 6  . letrozole (FEMARA) 2.5 MG tablet Take by mouth.    Marland Kitchen lisinopril (PRINIVIL,ZESTRIL) 5 MG tablet Take 1 tablet (5 mg total) by mouth daily. 90 tablet 3  . loratadine (CLARITIN) 10 MG tablet Take 10 mg by mouth daily.    . memantine (NAMENDA) 5 MG tablet Take 1 tablet (5 mg total) by mouth 2 (two) times daily. 180 tablet 3  . potassium citrate (UROCIT-K) 10 MEQ (1080 MG) SR tablet Take 10 mEq by mouth. BID    . propranolol (INDERAL) 20 MG tablet Take 2 tablets (40 mg total) by mouth 2 (two) times daily. 360 tablet 3  . Sennosides (SENNA LAX PO) Take  1 tablet by mouth as needed. Reported on 01/05/2016     No current facility-administered medications for this visit.    PHYSICAL EXAMINATION: ECOG PERFORMANCE STATUS: 0 - Asymptomatic  Filed Vitals:   01/20/16 1053  BP: 147/88  Pulse: 72  Temp: 97.7 F (36.5 C)  Resp: 18   Filed Weights   01/20/16 1053  Weight: 140 lb 11.2 oz (63.821 kg)    GENERAL:alert, no distress and comfortable SKIN: skin color,  texture, turgor are normal, no rashes or significant lesions EYES: normal, Conjunctiva are pink and non-injected, sclera clear OROPHARYNX:no exudate, no erythema and lips, buccal mucosa, and tongue normal  NECK: supple, thyroid normal size, non-tender, without nodularity LYMPH:  no palpable lymphadenopathy in the cervical, axillary or inguinal LUNGS: clear to auscultation and percussion with normal breathing effort HEART: regular rate & rhythm and no murmurs and no lower extremity edema ABDOMEN:abdomen soft, non-tender and normal bowel sounds MUSCULOSKELETAL:no cyanosis of digits and no clubbing  NEURO: alert & oriented x 3 with fluent speech, no focal motor/sensory deficits EXTREMITIES: No lower extremity edema BREAST: No palpable masses or nodules in either right or left breasts. No palpable axillary supraclavicular or infraclavicular adenopathy no breast tenderness or nipple discharge. (exam performed in the presence of a chaperone)  LABORATORY DATA:  I have reviewed the data as listed   Chemistry      Component Value Date/Time   NA 141 01/20/2016 1035   NA 142 10/29/2014   NA 137 07/02/2013 1525   NA 137 04/09/2012 1439   K 4.3 01/20/2016 1035   K 4.2 10/29/2014   K 4.9 07/02/2013 1525   CL 105 07/02/2013 1525   CL 105 12/31/2012 1032   CL 99 04/09/2012 1439   CO2 30* 01/20/2016 1035   CO2 29 07/02/2013 1525   CO2 31 04/09/2012 1439   BUN 20.7 01/20/2016 1035   BUN 15 10/29/2014   BUN 18 07/02/2013 1525   BUN 13 04/09/2012 1439   CREATININE 0.7 01/20/2016 1035   CREATININE 0.6 10/29/2014   CREATININE 0.77 07/02/2013 1525   CREATININE 0.7 04/09/2012 1439   GLU 95 10/29/2014      Component Value Date/Time   CALCIUM 9.5 01/20/2016 1035   CALCIUM 9.5 07/02/2013 1525   CALCIUM 9.4 04/09/2012 1439   ALKPHOS 75 01/20/2016 1035   ALKPHOS 78 07/02/2013 1525   ALKPHOS 63 04/09/2012 1439   AST 13 01/20/2016 1035   AST 15 10/29/2014   AST 29 07/02/2013 1525   ALT 12  01/20/2016 1035   ALT 13 10/29/2014   ALT 24 07/02/2013 1525   BILITOT 0.47 01/20/2016 1035   BILITOT 0.3 07/02/2013 1525   BILITOT 0.4 04/09/2012 1439       Lab Results  Component Value Date   WBC 5.1 01/20/2016   HGB 12.2 01/20/2016   HCT 36.1 01/20/2016   MCV 89.1 01/20/2016   PLT 168 01/20/2016   NEUTROABS 3.3 01/20/2016     ASSESSMENT & PLAN:  Breast cancer of upper-outer quadrant of left female breast ([place]) Left breast DCIS high-grade, 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 There is no data for extended adjuvant therapy for DCIS.  Breast Cancer Surveillance: 1. Breast exam 01/20/2016: Normal 2. Mammogram 06/09/2015 No abnormalities. Postsurgical changes. Breast Density  Category B. I recommended that she get 3-D mammograms for surveillance. Discussed the differences between different breast density categories.  Hypertension: Follow-up with primary care Osteopenia: DEXA scan March 2016  Return to clinic in 1 year for follow-up with survivorship    No orders of the defined types were placed in this encounter.   The patient has a good understanding of the overall plan. she agrees with it. she will call with any problems that may develop before the next visit here.   Rulon Eisenmenger, MD 01/20/2016

## 2016-01-21 ENCOUNTER — Ambulatory Visit (INDEPENDENT_AMBULATORY_CARE_PROVIDER_SITE_OTHER): Payer: Medicare Other | Admitting: Neurology

## 2016-01-21 ENCOUNTER — Encounter: Payer: Self-pay | Admitting: Neurology

## 2016-01-21 VITALS — BP 128/77 | HR 78 | Ht 67.0 in | Wt 121.0 lb

## 2016-01-21 DIAGNOSIS — G301 Alzheimer's disease with late onset: Secondary | ICD-10-CM

## 2016-01-21 DIAGNOSIS — F0280 Dementia in other diseases classified elsewhere without behavioral disturbance: Secondary | ICD-10-CM

## 2016-01-21 NOTE — Patient Instructions (Signed)
I had a long discussion with the patient, her sister and nephew regarding her dementia which appears reasonably stable and on the current dose of Aricept 10 mg daily and Namenda 5 mg twice daily. Patient still has underlying significant anxiety and recommend she takes Librax one tablet 3 times daily on a scheduled basis rather than as needed. I also briefly talked to her about fall and safety precautions. She will return for follow-up in 6 months or call earlier if necessary  Fall Prevention in the [place] can cause injuries. They can happen to people of all ages. There are many things you can do to make your home safe and to help prevent falls.  WHAT CAN I DO ON THE OUTSIDE OF MY HOME?  Regularly fix the edges of walkways and driveways and fix any cracks.  Remove anything that might make you trip as you walk through a door, such as a raised step or threshold.  Trim any bushes or trees on the path to your home.  Use bright outdoor lighting.  Clear any walking paths of anything that might make someone trip, such as rocks or tools.  Regularly check to see if handrails are loose or broken. Make sure that both sides of any steps have handrails.  Any raised decks and porches should have guardrails on the edges.  Have any leaves, snow, or ice cleared regularly.  Use sand or salt on walking paths during winter.  Clean up any spills in your garage right away. This includes oil or grease spills. WHAT CAN I DO IN THE BATHROOM?   Use night lights.  Install grab bars by the toilet and in the tub and shower. Do not use towel bars as grab bars.  Use non-skid mats or decals in the tub or shower.  If you need to sit down in the shower, use a plastic, non-slip stool.  Keep the floor dry. Clean up any water that spills on the floor as soon as it happens.  Remove soap buildup in the tub or shower regularly.  Attach bath mats securely with double-sided non-slip rug tape.  Do not have throw  rugs and other things on the floor that can make you trip. WHAT CAN I DO IN THE BEDROOM?  Use night lights.  Make sure that you have a light by your bed that is easy to reach.  Do not use any sheets or blankets that are too big for your bed. They should not hang down onto the floor.  Have a firm chair that has side arms. You can use this for support while you get dressed.  Do not have throw rugs and other things on the floor that can make you trip. WHAT CAN I DO IN THE KITCHEN?  Clean up any spills right away.  Avoid walking on wet floors.  Keep items that you use a lot in easy-to-reach places.  If you need to reach something above you, use a strong step stool that has a grab bar.  Keep electrical cords out of the way.  Do not use floor polish or wax that makes floors slippery. If you must use wax, use non-skid floor wax.  Do not have throw rugs and other things on the floor that can make you trip. WHAT CAN I DO WITH MY STAIRS?  Do not leave any items on the stairs.  Make sure that there are handrails on both sides of the stairs and use them. Fix handrails that are  broken or loose. Make sure that handrails are as long as the stairways.  Check any carpeting to make sure that it is firmly attached to the stairs. Fix any carpet that is loose or worn.  Avoid having throw rugs at the top or bottom of the stairs. If you do have throw rugs, attach them to the floor with carpet tape.  Make sure that you have a light switch at the top of the stairs and the bottom of the stairs. If you do not have them, ask someone to add them for you. WHAT ELSE CAN I DO TO HELP PREVENT FALLS?  Wear shoes that:  Do not have high heels.  Have rubber bottoms.  Are comfortable and fit you well.  Are closed at the toe. Do not wear sandals.  If you use a stepladder:  Make sure that it is fully opened. Do not climb a closed stepladder.  Make sure that both sides of the stepladder are locked into  place.  Ask someone to hold it for you, if possible.  Clearly mark and make sure that you can see:  Any grab bars or handrails.  First and last steps.  Where the edge of each step is.  Use tools that help you move around (mobility aids) if they are needed. These include:  Canes.  Walkers.  Scooters.  Crutches.  Turn on the lights when you go into a dark area. Replace any light bulbs as soon as they burn out.  Set up your furniture so you have a clear path. Avoid moving your furniture around.  If any of your floors are uneven, fix them.  If there are any pets around you, be aware of where they are.  Review your medicines with your doctor. Some medicines can make you feel dizzy. This can increase your chance of falling. Ask your doctor what other things that you can do to help prevent falls.   This information is not intended to replace advice given to you by your health care provider. Make sure you discuss any questions you have with your health care provider.   Document Released: 06/24/2009 Document Revised: 01/12/2015 Document Reviewed: 10/02/2014 Elsevier Interactive Patient Education [employer].

## 2016-01-21 NOTE — Progress Notes (Signed)
[facility] [address] [place]. [place] [address] [phone]       OFFICE FOLLOW UP NOTE  [name] [facility] Date of Birth:  1935/12/01 Medical Record Number:  [id number]   Referring MD:  Gaspar Garbe  Reason for Referral:  Memory loss  HPI: 89 year Caucasian lady  having mild progressive memory and cognitive difficulties for last 2 years which have been mildly progressive.She forgets  recent  Events, tasks and at times struggles to find words and finish sentences stopping in midsentence or occcasionally even stuttering..She misplaces things.at times she has been noticed as staring blankly into space and transiently unaware of her surroundings.She has longstanding h/o anxiety and has been on Librax 2.5 mg twice daily  For years and Inderal LA 60 mg has been added recently.She is yet independent in her ADLs and lives alone. There is no h/o headache, seizure, TIA, stroke, significant head injury with loss of consciousness.She has not had any brain imaging or lab work for causes of memory loss or detailed neuropsych testing. Update 08/12/13 : She returns for followup today and states that she did not start taking the fish oil as I had instructed and took only  the samples of Cerefolin I gave her and when she ran out she did not fill the prescription. She states her memory difficulties the about the same and anxiety seems to be increased. She did undergo the tests which I had ordered however. Vitamin B12, TSH and RPR on 06/09/13 were all normal. EEG done and 06/19/13 was normal without any epileptiform features. MRI scan of the brain on 06/20/13 showed mild changes of chronic microvascular ischemia and generalized cerebral atrophy. She admits that she is quite anxious and she has been taking Lipper ask and Inderal for a long time and has seen only her primary physician and perhaps may benefit by referral to a psychiatrist. She did not keep the appointment which we had made  [facility] as she prefers Dr Valentina Shaggy in [place] which is shorter drive for her. Update 02/04/2015 : She returns for follow-up today after last visit in December 2014. She is accompanied by sister. She continues to have cognitive difficulties mainly with short-term memory, multitasking, concentration. These subjectively seems to be worse but on objective testing today she actually did not score significantly less than last visit. She however did undergo detailed neuropsych testing on 10/22/14 by Dr. 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 Compared with similar testing by her in 2015 there appeared to be slight worsening. She was started on Aricept 5 mg daily 3 months ago by her primary physician she is tolerating it well without any GI or CNS side effects but the patient feels she is not subjectively any better. She was found to have difficulty with complex visual processing and advised to not drive or limit her driving. The patient informs me that she's not had any problems with driving and on my neurological exam she has full fields of vision and no major neurological deficits. She saw a psychiatrist in [place] but was not happy with him as he did not change her medications. Update 04/15/2015 : She returns for follow-up after last visit 2 months ago a complaint by sister and nephew. She continues to have memory difficulties as well as periods of transient confusion and disorientation off and on. She has now been on Aricept 10 mg for  the last 2 months and seems to be tolerating it reasonably well without significant GI side effects or dizziness. She has been driving without major problems but restricted to short distances. She does have difficulty with remembering directions. She has not had any delusions, hallucinations, falls, balance problems or safety concerns. On Mini-Mental status exam  today she scored 25 which is actually a drop from 29 from last visit. Update 06/23/15 : She returns for follow-up to last visit 2 months ago. She is a compared by her daughter and son. They have noticed some improvement in her memory and confusion. He was not able to tolerate Namenda when she increase the dose to 10 mg twice daily and hence went down to 5 mg twice daily on 05/18/15 which is tolerating much better. She in fact scored 29/30 on the Mini-Mental which is improved from 25/30 at last visit. She still has some good days and bad days. But she is more interactive and can produce patent conversations better. She denies significant nausea, vomiting, diarrhea or dizziness. There have been no major safety of issues. She has some mild impaired balance but has not had major injury or fall. She does not have significant delusions or hallucinations but does occasionally get agitated but can be redirected. She continues to live alone and her daughter lives across the street and has close supervision. She is driving to a limited extent and there have been no accidents. Update 01/21/2016 : She returns for follow-up after last visit 6 months ago. She is accompanied by her daughter and grandson. She continues to do resumed it well. She still has problems with numbers and names and calculations. She still lives alone. Family provides some supervision but mostly she is independent. She has own social circles and does go out with large groups. She does also go to the [facility] 3 days a week for exercise. She remains a bit and likes to do word gambles but does not do another cognitively challenging activities. She walks pretty good and there have been no safety concerns identified. She's not had any recent falls. She has not had any delusions or hallucinations. She does have significant anxiety and does get upset from time to time. She has a prescription for Librax to be taken 3 times a day but she usually takes it not more than  twice. The daughter feels that by late afternoon she is very anxious and may benefit by taking an additional dose at lunchtime She still driving very limited mostly during daytime to family or crowds. ROS:   14 system review of systems is positive for hearing loss,     ringing in the ears,  eye itching, runny nose, palpitations, abdominal pain, swollen abdomen, diarrhea, constipation, apnea, frequent waking, sleep talking, environmental allergies, urinary degrees, joint and back pain, muscle cramps, neck pain, skin moles, memory loss, headache, numbness, tremors, agitation, confusion, depression, anxiety and nervousness and all other systems negative  PMH:  Past Medical History  Diagnosis Date  . Hypertension   . PVC (premature ventricular contraction)   . Hyperlipidemia   . Arthritis   . Sleep apnea   . GERD (gastroesophageal reflux disease)   . Cancer ([place])     skin  . Anxiety   . Osteoporosis   . Abdominal pain   . Abdominal distention   . Palpitations   . Generalized headaches   . Confusion   . Rectal pain   . Breast cancer in situ 07/22/2011  . Benign essential  HTN 01/02/2012  . Hyperlipidemia 01/02/2012  . Memory loss   . Depression     Social History:  Social History   Social History  . Marital Status: Single    Spouse Name: N/A  . Number of Children: N/A  . Years of Education: N/A   Occupational History  . Not on file.   Social History Main Topics  . Smoking status: Never Smoker   . Smokeless tobacco: Never Used  . Alcohol Use: No  . Drug Use: No  . Sexual Activity: Not on file   Other Topics Concern  . Not on file   Social History Narrative    Medications:   Current Outpatient Prescriptions on File Prior to Visit  Medication Sig Dispense Refill  . acetaminophen (TYLENOL) 500 MG tablet Take 500 mg by mouth every 6 (six) hours as needed.    Marland Kitchen aspirin 81 MG tablet ASPIRIN EC LOW DOSE, 81MG  (Oral Tablet Delayed Release)  1 Every Day for 0 days  Quantity:  0.00;  Refills: 0   Ordered :12-May-2010  Abran Richard ;  Started 05-Feb-2009 Active Comments: DX: 272.0    . calcium carbonate (TUMS - DOSED IN MG ELEMENTAL CALCIUM) 500 MG chewable tablet Chew 1 tablet by mouth. PRN    . Cholecalciferol (VITAMIN D3) 1000 UNITS CAPS Take 2 capsules by mouth daily.    . clidinium-chlordiazePOXIDE (LIBRAX) 5-2.5 MG capsule TAKE 1 OR 2 CAPSULES BY MOUTH 3 TIMES A DAY AS NEEDED 540 capsule 1  . donepezil (ARICEPT) 5 MG tablet TAKE 2 TABLETS BY MOUTH AT BEDTIME 180 tablet 1  . lisinopril (PRINIVIL,ZESTRIL) 5 MG tablet Take 1 tablet (5 mg total) by mouth daily. 90 tablet 3  . memantine (NAMENDA) 5 MG tablet Take 1 tablet (5 mg total) by mouth 2 (two) times daily. 180 tablet 3  . potassium citrate (UROCIT-K) 10 MEQ (1080 MG) SR tablet Take 10 mEq by mouth. BID    . propranolol (INDERAL) 20 MG tablet Take 2 tablets (40 mg total) by mouth 2 (two) times daily. 360 tablet 3  . Sennosides (SENNA LAX PO) Take 1 tablet by mouth as needed. Reported on 01/05/2016     No current facility-administered medications on file prior to visit.    Allergies:   Allergies  Allergen Reactions  . Sulfa Antibiotics Nausea Only    Physical Exam General: frail elderly Caucasian lady, seated, in no evident distress Head: head normocephalic and atraumatic.   Neck: supple with no carotid or supraclavicular bruits Cardiovascular: regular rate and rhythm, no murmurs Musculoskeletal: no deformity Skin:  no rash/petichiae Vascular:  Normal pulses all extremities Filed Vitals:   01/21/16 0922  BP: 128/77  Pulse: 78    Neurologic Exam Mental Status: Awake and fully alert. Oriented to place and time. Recent and remote memory intact. Attention span, concentration and fund of knowledge appropriate. Mood and affect appropriate. MMSE 30/30. Clock drawing 4/4. Animal Naming Test 12.Clock drawing 4/4.  Cranial Nerves: Fundoscopic exam not done.  Pupils equal, briskly reactive to light.  Extraocular movements full without nystagmus. Visual fields full to confrontation. Hearing intact. Facial sensation intact. Face, tongue, palate moves normally and symmetrically.  Motor: Normal bulk and tone. Normal strength in all tested extremity muscles. Sensory.: intact to tough and pinprick and vibratory.  Coordination: Rapid alternating movements normal in all extremities. Finger-to-nose and heel-to-shin performed accurately bilaterally. Gait and Station: Arises from chair without difficulty. Stance is normal. Gait demonstrates normal stride length and balance . Able  to heel, toe and tandem walk without difficulty.  Reflexes: 1+ and symmetric. Toes downgoing.      ASSESSMENT: 66 year lady with progressive mild memory difficulties  X 3 years-likely mild cognitive impairment with some component of underlying anxiety/depression Mild Alzheimer`s dementia due to  significant progression over last 2 years but now response to Aricept and Namenda    PLAN:  I had a long discussion with the patient, her sister and nephew regarding her dementia which appears reasonably stable and on the current dose of Aricept 10 mg daily and Namenda 5 mg twice daily. Patient still has underlying significant anxiety and recommend she takes Librax one tablet 3 times daily on a scheduled basis rather than as needed. I also briefly talked to her about fall and safety precautions. Greater than 50% time during this 25 minute visit was spent on counseling and coordination of care about her dementia. She will return for follow-up in 6 months or call earlier if necessary  Antony Contras, MD

## 2016-02-14 ENCOUNTER — Other Ambulatory Visit: Payer: Self-pay | Admitting: Neurology

## 2016-05-17 ENCOUNTER — Telehealth: Payer: Self-pay

## 2016-05-17 DIAGNOSIS — F0390 Unspecified dementia without behavioral disturbance: Secondary | ICD-10-CM

## 2016-05-17 MED ORDER — MEMANTINE HCL 5 MG PO TABS: 5.0000 mg | ORAL_TABLET | Freq: Two times a day (BID) | ORAL | 3 refills | Status: DC

## 2016-05-17 NOTE — Telephone Encounter (Signed)
Refill request

## 2016-06-15 LAB — HM MAMMOGRAPHY

## 2016-06-20 ENCOUNTER — Encounter: Payer: Self-pay | Admitting: Family Medicine

## 2016-07-11 ENCOUNTER — Ambulatory Visit (INDEPENDENT_AMBULATORY_CARE_PROVIDER_SITE_OTHER): Payer: Medicare Other | Admitting: Family Medicine

## 2016-07-11 ENCOUNTER — Encounter: Payer: Self-pay | Admitting: Family Medicine

## 2016-07-11 VITALS — BP 132/68 | HR 80 | Temp 98.4°F | Resp 14 | Ht 65.25 in | Wt 139.0 lb

## 2016-07-11 DIAGNOSIS — M818 Other osteoporosis without current pathological fracture: Secondary | ICD-10-CM

## 2016-07-11 DIAGNOSIS — Z0000 Encounter for general adult medical examination without abnormal findings: Secondary | ICD-10-CM | POA: Diagnosis not present

## 2016-07-11 DIAGNOSIS — G3184 Mild cognitive impairment, so stated: Secondary | ICD-10-CM

## 2016-07-11 DIAGNOSIS — K5909 Other constipation: Secondary | ICD-10-CM

## 2016-07-11 DIAGNOSIS — Z8639 Personal history of other endocrine, nutritional and metabolic disease: Secondary | ICD-10-CM | POA: Diagnosis not present

## 2016-07-11 DIAGNOSIS — J302 Other seasonal allergic rhinitis: Secondary | ICD-10-CM

## 2016-07-11 MED ORDER — ALENDRONATE SODIUM 70 MG PO TABS: 70.0000 mg | ORAL_TABLET | ORAL | 11 refills | Status: DC

## 2016-07-11 MED ORDER — POLYETHYLENE GLYCOL 3350 17 GM/SCOOP PO POWD
1.0000 | Freq: Once | ORAL | 0 refills | Status: AC
Start: 2016-07-11 — End: 2016-07-11

## 2016-07-11 NOTE — Progress Notes (Signed)
Patient: Brandi Hanson, Female    DOB: [birth year], 80 y.o.   MRN: [id number] Visit Date: 07/11/2016  Today's Provider: Wilhemena Durie, MD   Chief Complaint  Patient presents with  . Medicare Wellness   Subjective:   Brandi Hanson is a 80 y.o. female who presents today for her Subsequent Annual Wellness Visit. She feels fairly well. She reports exercising walking about 5 days a week and sometimes every day for about 30 minutes. She reports she is sleeping well. She has progressive cognitive impairment/early dementia and she is having increasing fall risk as she is unsteady on her feet. She has only had 1 fall this year but she and her friend who is with her her increasingly aware of her unsteadiness. She has had breast cancer in the past.  Immunization History  Administered Date(s) Administered  . Influenza, High Dose Seasonal PF 06/08/2014, 07/07/2015  . Influenza-Unspecified 05/16/2013  . Pneumococcal Conjugate-13 10/28/2014  . Pneumococcal Polysaccharide-23 07/10/2003  . Td 03/29/2007  . Zoster 07/10/2007   Last Colonoscopy 06/30/13 normal  Mammogram 06/15/16  BMD 11/17/14 osteoporosis  Patient sees neurologist in [place] and has appointment to follow up on November 20th for dementia. Review of Systems  Constitutional: Negative.   HENT: Positive for drooling, hearing loss, postnasal drip, rhinorrhea, sneezing and tinnitus.   Eyes: Negative.   Respiratory: Positive for apnea.   Cardiovascular: Positive for palpitations.  Gastrointestinal: Positive for abdominal distention, abdominal pain, constipation and diarrhea.  Endocrine: Negative.   Genitourinary: Positive for decreased urine volume, difficulty urinating and dysuria.  Musculoskeletal: Positive for arthralgias, back pain, gait problem, myalgias and neck pain.  Allergic/Immunologic: Negative.   Neurological: Positive for tremors, speech difficulty and headaches.  Hematological: Negative.   Psychiatric/Behavioral:  Positive for agitation, confusion, decreased concentration and dysphoric mood. The patient is nervous/anxious.     Patient Active Problem List   Diagnosis Date Noted  . Allergic rhinitis 07/07/2015  . MCI (mild cognitive impairment) with memory loss 03/25/2015  . Depression, major, recurrent, mild ([place]) 03/25/2015  . Generalized anxiety disorder 03/25/2015  . IBS (irritable bowel syndrome) 02/15/2015  . At risk for falling 01/08/2015  . Body mass index (BMI) of 23.0-23.9 in adult 01/08/2015  . H/O malignant neoplasm of breast 01/08/2015  . Chronic constipation 01/08/2015  . Breast cancer of upper-outer quadrant of left female breast ([place]) 01/08/2015  . HZV (herpes zoster virus) post herpetic neuralgia 01/08/2015  . Apnea, sleep 01/08/2015  . Palpitations 08/21/2013  . Benign essential HTN 01/02/2012  . Beat, premature ventricular 04/21/2009  . Absolute anemia 02/05/2009  . Acid reflux 02/05/2009  . Essential (primary) hypertension 02/05/2009  . Bone/cartilage disorder 07/22/2007  . Avitaminosis D 10/08/2006  . Carotid artery obstruction 09/11/1998    Social History   Social History  . Marital status: Single    Spouse name: N/A  . Number of children: N/A  . Years of education: N/A   Occupational History  . Not on file.   Social History Main Topics  . Smoking status: Never Smoker  . Smokeless tobacco: Never Used  . Alcohol use No  . Drug use: No  . Sexual activity: No   Other Topics Concern  . Not on file   Social History Narrative  . No narrative on file    Past Surgical History:  Procedure Laterality Date  . ABDOMINAL HYSTERECTOMY  1978  . [place]   left  . breast cancer  10/2010   ductal  carcinoma in situ, left breast  . CATARACT EXTRACTION  2011   bilateral  . FOOT SURGERY  2005  . KIDNEY STONE SURGERY  2011  . ROTATOR CUFF REPAIR  2007    Her family history includes Cancer in her father, maternal grandfather, mother, and sister;  Crohn's disease in her mother; Heart disease in her maternal grandmother, paternal grandfather, and paternal grandmother; Hypertension in her father, maternal grandfather, and sister.    Outpatient Encounter Prescriptions as of 07/11/2016  Medication Sig Note  . acetaminophen (TYLENOL) 500 MG tablet Take 500 mg by mouth every 6 (six) hours as needed.   Marland Kitchen aspirin 81 MG tablet ASPIRIN EC LOW DOSE, 81MG  (Oral Tablet Delayed Release)  1 Every Day for 0 days  Quantity: 0.00;  Refills: 0   Ordered :12-May-2010  Abran Mckynzi Cammon ;  Started 05-Feb-2009 Active Comments: DX: 272.0 01/08/2015: DX: 272.0 Received from: [facility]  . calcium carbonate (TUMS - DOSED IN MG ELEMENTAL CALCIUM) 500 MG chewable tablet Chew 1 tablet by mouth. PRN 01/08/2015: Received from: [facility]  . Cholecalciferol (VITAMIN D3) 1000 UNITS CAPS Take 2 capsules by mouth daily. 01/08/2015: Received from: [facility]  . clidinium-chlordiazePOXIDE (LIBRAX) 5-2.5 MG capsule TAKE 1 OR 2 CAPSULES BY MOUTH 3 TIMES A DAY AS NEEDED   . donepezil (ARICEPT) 5 MG tablet TAKE 2 TABLETS BY MOUTH AT BEDTIME   . lisinopril (PRINIVIL,ZESTRIL) 5 MG tablet Take 1 tablet (5 mg total) by mouth daily.   . memantine (NAMENDA) 5 MG tablet Take 1 tablet (5 mg total) by mouth 2 (two) times daily.   . potassium citrate (UROCIT-K) 10 MEQ (1080 MG) SR tablet Take 10 mEq by mouth. BID 01/08/2015: Received from: [facility]  . propranolol (INDERAL) 20 MG tablet Take 2 tablets (40 mg total) by mouth 2 (two) times daily.   . [DISCONTINUED] Sennosides (SENNA LAX PO) Take 1 tablet by mouth as needed. Reported on 01/05/2016    No facility-administered encounter medications on file as of 07/11/2016.     Allergies  Allergen Reactions  . Sulfa Antibiotics Nausea Only    Patient Care Team: Jerrol Banana., MD as PCP - General (Family Medicine)  Objective:   Vitals:  Vitals:   07/11/16 1041   BP: 132/68  Pulse: 80  Resp: 14  Temp: 98.4 F (36.9 C)  Weight: 139 lb (63 kg)  Height: 5' 5.25" (1.657 m)    Physical Exam  Constitutional: She is oriented to person, place, and time. She appears well-developed and well-nourished.  HENT:  Head: Normocephalic and atraumatic.  Right Ear: External ear normal.  Left Ear: External ear normal.  Nose: Nose normal.  Mouth/Throat: Oropharynx is clear and moist.  Eyes: Conjunctivae are normal. Pupils are equal, round, and reactive to light.  Neck: Normal range of motion. Neck supple.  Cardiovascular: Normal rate, regular rhythm, normal heart sounds and intact distal pulses.   No murmur heard. Pulmonary/Chest: Effort normal and breath sounds normal. No respiratory distress. She has no wheezes. Right breast exhibits no inverted nipple and no tenderness. Left breast exhibits inverted nipple. Left breast exhibits no tenderness.  Abdominal: Soft. She exhibits no distension. There is no tenderness.  A little full today on the exam, there appears to be stool in her sigmoid colon.  Genitourinary:  Genitourinary Comments: Had complete hysterectomy, pelvic exam and DRE deferred.  Musculoskeletal: She exhibits no deformity.  kyphosis  Neurological: She is alert and oriented to person,  place, and time. No cranial nerve deficit. She exhibits normal muscle tone. Coordination normal.  Skin: Skin is warm and dry. No rash noted. No erythema.  Psychiatric: She has a normal mood and affect. Her behavior is normal.  Memory loss    Activities of Daily Living In your present state of health, do you have any difficulty performing the following activities: 07/11/2016  Hearing? Y  Vision? N  Difficulty concentrating or making decisions? Y  Walking or climbing stairs? Y  Dressing or bathing? N  Doing errands, shopping? N  Some recent data might be hidden    Fall Risk Assessment Fall Risk  07/11/2016 01/21/2016 01/21/2016 07/07/2015 07/07/2015  Falls in  the past year? Yes No Yes No No  Number falls in past yr: 1 - 1 - -  Injury with Fall? Yes No No - -  Risk for fall due to : - - - - -  Follow up - Education provided;Falls prevention discussed - - -     Depression Screen PHQ 2/9 Scores 07/11/2016 07/11/2016 07/07/2015 06/23/2015  PHQ - 2 Score 3 3 6  0  PHQ- 9 Score 10 - 12 -    Cognitive Testing - 6-CIT    Year: 0 4 points  Month: 0 3 points  Memorize "Pia Mau, [address] [place]"  Time (within 1 hour:) 0 3 points  Count backwards from 20: 0 2 4 points  Name months of year: 0 2 4 points  Repeat Address: 0 2 4 6 8 10  points   Total Score: 15/28  Interpretation : Normal (0-7) Abnormal (8-28)    Assessment & Plan:     Annual Wellness Visit  Reviewed patient's Family Medical History Reviewed and updated list of patient's medical providers Assessment of cognitive impairment was done Assessed patient's functional ability Established a written schedule for health screening [place] Completed and Reviewed  Exercise Activities and Dietary recommendations Goals    . Exercise 150 minutes per week (moderate activity)      1. Medicare annual wellness visit, subsequent Get labs today. 2. MCI (mild cognitive impairment) with memory loss She is following up with neurologist in [place]. Seems things are getting worse. Patient still lives at home alone. I feel for safety reasons and to make sure she is taking her medications correctly would like Home Health to come out for evaluation. Patient does not drive.  3. Other constipation Advised patient to try Glycolax. Follow. May need further treatment plan. 4. AR Patient is stable with for now. Follow 5. Osteoporosis Start Fosamax.Avoid estrogen with history of breast cancer 6. History of breast cancer HPI, Exam and A&P transcribed under direction and in the presence of Miguel Aschoff, MD. I have done the exam and reviewed the chart and it is  accurate to the best of my knowledge. Miguel Aschoff M.D. [facility]

## 2016-07-12 LAB — TSH: TSH: 1.01 u[IU]/mL (ref 0.450–4.500)

## 2016-07-12 LAB — HEMOGLOBIN A1C
ESTIMATED AVERAGE GLUCOSE: 117 mg/dL
Hgb A1c MFr Bld: 5.7 % — ABNORMAL HIGH (ref 4.8–5.6)

## 2016-07-14 ENCOUNTER — Telehealth: Payer: Self-pay | Admitting: Family Medicine

## 2016-07-14 NOTE — Telephone Encounter (Signed)
Kim with [facility] speech therapy states pt was on her schedule for today.  Maudie Mercury states she has called pt to confirm appointment but she is unable to reach pt by phone.  Kim wanted to advise she will continue to contact pt to reschedule appointment for next week.  CB#[phone]/MW

## 2016-07-14 NOTE — Telephone Encounter (Signed)
Pt called wanting to understand why the nurse from [facility] wants to make an appt.  She does not think she needs speech therapy.  Pts cll back is [phone]  Thanks [name]

## 2016-07-14 NOTE — Telephone Encounter (Signed)
I called Brandi Hanson with Brandi Hanson, per note we ordered Home health. Want to see where speech therapy order came from before calling patient back-aa

## 2016-07-14 NOTE — Telephone Encounter (Signed)
fyi-aa 

## 2016-07-14 NOTE — Telephone Encounter (Signed)
She does not need sppech unless there is something I do not know.

## 2016-07-15 NOTE — Telephone Encounter (Signed)
If she is having some insomnia consider trying Mirtazapine 30mg  at hight and see [place] back in 1 month.

## 2016-07-15 NOTE — Telephone Encounter (Signed)
Spoke with patient and explained to her in detail what all of this means. She has been anxious about everything she is having to do and all the doctors she is seen and I advised her that if she could let the nurse come at least once and if she feels overwhelmed or does not want to do this to let us know. Advised her we are not trying to force her into anything and just want to help her. Spent about 15 minutes on the phone with patient just trying to explain everything and making sure she does not get too anxious. She is having hard time with anxiety and dealing with her health with her sister not understanding how she feels and that this is real feeling of depression/anxiety.She agreed to see the nurse so far and let us know if she changed her mind or had any other concerns. She has follow up with [place] on 11/20-aa

## 2016-07-15 NOTE — Telephone Encounter (Signed)
Spoke with Maudie Mercury from [facility], she states cognitive issues/memory issues falls into speech therapy category-she said this category is for memory issues, cognitive, dysphagia, etc. Will call patient and explain this to her-aa

## 2016-07-21 ENCOUNTER — Telehealth: Payer: Self-pay | Admitting: Family Medicine

## 2016-07-21 NOTE — Telephone Encounter (Signed)
Kim with [name] called to request a verbal order for 4 speech therapy visits for memory therapy.  CB#[phone]/MW

## 2016-07-21 NOTE — Telephone Encounter (Signed)
Please review-aa 

## 2016-07-21 NOTE — Telephone Encounter (Signed)
Brandi Hanson

## 2016-07-21 NOTE — Telephone Encounter (Signed)
ok 

## 2016-07-26 DIAGNOSIS — M810 Age-related osteoporosis without current pathological fracture: Secondary | ICD-10-CM | POA: Diagnosis not present

## 2016-07-26 DIAGNOSIS — F329 Major depressive disorder, single episode, unspecified: Secondary | ICD-10-CM | POA: Diagnosis not present

## 2016-07-26 DIAGNOSIS — Z9114 Patient's other noncompliance with medication regimen: Secondary | ICD-10-CM | POA: Diagnosis not present

## 2016-07-26 DIAGNOSIS — I10 Essential (primary) hypertension: Secondary | ICD-10-CM | POA: Diagnosis not present

## 2016-07-26 DIAGNOSIS — F419 Anxiety disorder, unspecified: Secondary | ICD-10-CM | POA: Diagnosis not present

## 2016-07-26 DIAGNOSIS — F0390 Unspecified dementia without behavioral disturbance: Secondary | ICD-10-CM | POA: Diagnosis not present

## 2016-07-31 ENCOUNTER — Ambulatory Visit (INDEPENDENT_AMBULATORY_CARE_PROVIDER_SITE_OTHER): Payer: Medicare Other | Admitting: Neurology

## 2016-07-31 ENCOUNTER — Encounter: Payer: Self-pay | Admitting: Neurology

## 2016-07-31 ENCOUNTER — Ambulatory Visit: Payer: Medicare Other | Admitting: Neurology

## 2016-07-31 VITALS — BP 131/73 | HR 68 | Wt 138.0 lb

## 2016-07-31 DIAGNOSIS — G301 Alzheimer's disease with late onset: Secondary | ICD-10-CM

## 2016-07-31 DIAGNOSIS — F0280 Dementia in other diseases classified elsewhere without behavioral disturbance: Secondary | ICD-10-CM | POA: Diagnosis not present

## 2016-07-31 NOTE — Progress Notes (Signed)
[facility] [address] [place]. [place] [address] [phone]       OFFICE FOLLOW UP NOTE  Ms. Lidia Clavijo [facility] Date of Birth:  1935/12/01 Medical Record Number:  [id number]   Referring MD:  Gaspar Garbe  Reason for Referral:  Memory loss  HPI: 89 year Caucasian lady  having mild progressive memory and cognitive difficulties for last 2 years which have been mildly progressive.She forgets  recent  Events, tasks and at times struggles to find words and finish sentences stopping in midsentence or occcasionally even stuttering..She misplaces things.at times she has been noticed as staring blankly into space and transiently unaware of her surroundings.She has longstanding h/o anxiety and has been on Librax 2.5 mg twice daily  For years and Inderal LA 60 mg has been added recently.She is yet independent in her ADLs and lives alone. There is no h/o headache, seizure, TIA, stroke, significant head injury with loss of consciousness.She has not had any brain imaging or lab work for causes of memory loss or detailed neuropsych testing. Update 08/12/13 : She returns for followup today and states that she did not start taking the fish oil as I had instructed and took only  the samples of Cerefolin I gave her and when she ran out she did not fill the prescription. She states her memory difficulties the about the same and anxiety seems to be increased. She did undergo the tests which I had ordered however. Vitamin B12, TSH and RPR on 06/09/13 were all normal. EEG done and 06/19/13 was normal without any epileptiform features. MRI scan of the brain on 06/20/13 showed mild changes of chronic microvascular ischemia and generalized cerebral atrophy. She admits that she is quite anxious and she has been taking Lipper ask and Inderal for a long time and has seen only her primary physician and perhaps may benefit by referral to a psychiatrist. She did not keep the appointment which we had made  [facility] as she prefers Dr Valentina Shaggy in [place] which is shorter drive for her. Update 02/04/2015 : She returns for follow-up today after last visit in December 2014. She is accompanied by sister. She continues to have cognitive difficulties mainly with short-term memory, multitasking, concentration. These subjectively seems to be worse but on objective testing today she actually did not score significantly less than last visit. She however did undergo detailed neuropsych testing on 10/22/14 by Dr. 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 Compared with similar testing by her in 2015 there appeared to be slight worsening. She was started on Aricept 5 mg daily 3 months ago by her primary physician she is tolerating it well without any GI or CNS side effects but the patient feels she is not subjectively any better. She was found to have difficulty with complex visual processing and advised to not drive or limit her driving. The patient informs me that she's not had any problems with driving and on my neurological exam she has full fields of vision and no major neurological deficits. She saw a psychiatrist in [place] but was not happy with him as he did not change her medications. Update 04/15/2015 : She returns for follow-up after last visit 2 months ago a complaint by sister and nephew. She continues to have memory difficulties as well as periods of transient confusion and disorientation off and on. She has now been on Aricept 10 mg for  the last 2 months and seems to be tolerating it reasonably well without significant GI side effects or dizziness. She has been driving without major problems but restricted to short distances. She does have difficulty with remembering directions. She has not had any delusions, hallucinations, falls, balance problems or safety concerns. On Mini-Mental status exam  today she scored 25 which is actually a drop from 29 from last visit. Update 06/23/15 : She returns for follow-up to last visit 2 months ago. She is a compared by her daughter and son. They have noticed some improvement in her memory and confusion. He was not able to tolerate Namenda when she increase the dose to 10 mg twice daily and hence went down to 5 mg twice daily on 05/18/15 which is tolerating much better. She in fact scored 29/30 on the Mini-Mental which is improved from 25/30 at last visit. She still has some good days and bad days. But she is more interactive and can produce patent conversations better. She denies significant nausea, vomiting, diarrhea or dizziness. There have been no major safety of issues. She has some mild impaired balance but has not had major injury or fall. She does not have significant delusions or hallucinations but does occasionally get agitated but can be redirected. She continues to live alone and her daughter lives across the street and has close supervision. She is driving to a limited extent and there have been no accidents. Update 01/21/2016 : She returns for follow-up after last visit 6 months ago. She is accompanied by her daughter and grandson. She continues to do resumed it well. She still has problems with numbers and names and calculations. She still lives alone. Family provides some supervision but mostly she is independent. She has own social circles and does go out with large groups. She does also go to the [facility] 3 days a week for exercise. She remains a bit and likes to do word gambles but does not do another cognitively challenging activities. She walks pretty good and there have been no safety concerns identified. She's not had any recent falls. She has not had any delusions or hallucinations. She does have significant anxiety and does get upset from time to time. She has a prescription for Librax to be taken 3 times a day but she usually takes it not more than  twice. The daughter feels that by late afternoon she is very anxious and may benefit by taking an additional dose at lunchtime She still driving very limited mostly during daytime to family or crowds. Update 07/31/2016 : She returns for follow-up after last visit 6 months ago. She is accompanied by her sister and nephew. Patient continues to have mild cognitive difficulties with short-term memory, forgetting names. At times unable to complete sentences. She also has anxiety. The family feels at times when several people are talking she has trouble focusing and paying attention and can zone out. She gets quite frustrated when there is conversation in a crowd. She still has trouble remembering dates and appointments. Family feels she may also be losing some interest in visiting places and going out of the house. She still continues to drive and has gotten lost a couple of occasions. The sister makes every attempt to be with the patient when she drives. The patient has recently gotten new primary care physician. On Mini-Mental status exam today she scored 30/30 which is unchanged from last visit. She remains on Aricept and Namenda which is tolerating well without side effects. ROS:  14 system review of systems is positive for hearing loss,     weight loss, palpitations, ringing in the ears, skin moles, cough, diarrhea, constipation, urination problems, feeling hot, flushing, joint pain, allergies, runny nose, tremor, confusion, memory loss, insomnia, sleepiness and all other systems negative   PMH:  Past Medical History:  Diagnosis Date  . Abdominal distention   . Abdominal pain   . Anxiety   . Arthritis   . Benign essential HTN 01/02/2012  . Breast cancer in situ 07/22/2011  . Cancer ([place])    skin  . Confusion   . Depression   . Generalized headaches   . GERD (gastroesophageal reflux disease)   . Hyperlipidemia   . Hyperlipidemia 01/02/2012  . Hypertension   . Memory loss   . Osteoporosis   .  Palpitations   . PVC (premature ventricular contraction)   . Rectal pain   . Sleep apnea     Social History:  Social History   Social History  . Marital status: Single    Spouse name: N/A  . Number of children: N/A  . Years of education: N/A   Occupational History  . Not on file.   Social History Main Topics  . Smoking status: Never Smoker  . Smokeless tobacco: Never Used  . Alcohol use No  . Drug use: No  . Sexual activity: No   Other Topics Concern  . Not on file   Social History Narrative  . No narrative on file    Medications:   Current Outpatient Prescriptions on File Prior to Visit  Medication Sig Dispense Refill  . acetaminophen (TYLENOL) 500 MG tablet Take 500 mg by mouth every 6 (six) hours as needed.    Marland Kitchen alendronate (FOSAMAX) 70 MG tablet Take 1 tablet (70 mg total) by mouth every 7 (seven) days. Take with a full glass of water on an empty stomach. 4 tablet 11  . aspirin 81 MG tablet ASPIRIN EC LOW DOSE, 81MG  (Oral Tablet Delayed Release)  1 Every Day for 0 days  Quantity: 0.00;  Refills: 0   Ordered :12-May-2010  Abran Richard ;  Started 05-Feb-2009 Active Comments: DX: 272.0    . calcium carbonate (TUMS - DOSED IN MG ELEMENTAL CALCIUM) 500 MG chewable tablet Chew 1 tablet by mouth. PRN    . Cholecalciferol (VITAMIN D3) 1000 UNITS CAPS Take 2 capsules by mouth daily.    . clidinium-chlordiazePOXIDE (LIBRAX) 5-2.5 MG capsule TAKE 1 OR 2 CAPSULES BY MOUTH 3 TIMES A DAY AS NEEDED 540 capsule 1  . donepezil (ARICEPT) 5 MG tablet TAKE 2 TABLETS BY MOUTH AT BEDTIME 180 tablet 1  . lisinopril (PRINIVIL,ZESTRIL) 5 MG tablet Take 1 tablet (5 mg total) by mouth daily. 90 tablet 3  . memantine (NAMENDA) 5 MG tablet Take 1 tablet (5 mg total) by mouth 2 (two) times daily. 180 tablet 3  . potassium citrate (UROCIT-K) 10 MEQ (1080 MG) SR tablet Take 10 mEq by mouth. BID    . propranolol (INDERAL) 20 MG tablet Take 2 tablets (40 mg total) by mouth 2 (two) times daily. 360  tablet 3   No current facility-administered medications on file prior to visit.     Allergies:   Allergies  Allergen Reactions  . Sulfa Antibiotics Nausea Only    Physical Exam General: frail elderly Caucasian lady, seated, in no evident distress Head: head normocephalic and atraumatic.   Neck: supple with no carotid or supraclavicular bruits Cardiovascular: regular rate and rhythm, no  murmurs Musculoskeletal: no deformity Skin:  no rash/petichiae Vascular:  Normal pulses all extremities Vitals:   07/31/16 0936  BP: 131/73  Pulse: 68    Neurologic Exam Mental Status: Awake and fully alert. Oriented to place and time. Recent and remote memory intact. Attention span, concentration and fund of knowledge appropriate. Mood and affect appropriate. MMSE 30/30. Clock drawing 4/4. Animal Naming Test 9.Clock drawing 4/4.  Cranial Nerves: Fundoscopic exam not done.  Pupils equal, briskly reactive to light. Extraocular movements full without nystagmus. Visual fields full to confrontation. Hearing intact. Facial sensation intact. Face, tongue, palate moves normally and symmetrically.  Motor: Normal bulk and tone. Normal strength in all tested extremity muscles. Sensory.: intact to tough and pinprick and vibratory.  Coordination: Rapid alternating movements normal in all extremities. Finger-to-nose and heel-to-shin performed accurately bilaterally. Gait and Station: Arises from chair without difficulty. Stance is normal. Gait demonstrates normal stride length and balance . Able to heel, toe and tandem walk without difficulty.  Reflexes: 1+ and symmetric. Toes downgoing.      ASSESSMENT: 57 year lady with progressive mild memory difficulties  X 3 years-likely mild cognitive impairment with some component of underlying anxiety/depression Mild Alzheimer`s dementia due to  significant progression over last 2 years but now response to Aricept and Namenda    PLAN:   I had a long discussion with  the patient, her sister and nephew regarding her dementia which appears reasonably stable and on the current dose of Aricept 10 mg daily and Namenda 5 mg twice daily.  I also briefly talked to her about fall and safety precautions and to limit her driving to familiar routes only. I also encouraged the patient to consider possible participation in the CREAD early dementia trial and we will give her information to review at home and decide. Greater than 50% time during this 25 minute visit was spent on counseling and coordination of care about her dementia. She will return for follow-up in 6 months or call earlier if necessary  Antony Contras, MD

## 2016-08-02 ENCOUNTER — Telehealth: Payer: Self-pay | Admitting: Family Medicine

## 2016-08-02 NOTE — Telephone Encounter (Signed)
Brandi Hanson with [name] called and said all of her nursing visits are complete.  ThanksTeri

## 2016-08-02 NOTE — Telephone Encounter (Signed)
FYI

## 2016-08-16 ENCOUNTER — Other Ambulatory Visit: Payer: Self-pay

## 2016-08-16 ENCOUNTER — Telehealth: Payer: Self-pay | Admitting: Neurology

## 2016-08-16 DIAGNOSIS — G301 Alzheimer's disease with late onset: Principal | ICD-10-CM

## 2016-08-16 DIAGNOSIS — F0280 Dementia in other diseases classified elsewhere without behavioral disturbance: Secondary | ICD-10-CM

## 2016-08-16 MED ORDER — DONEPEZIL HCL 10 MG PO TABS: 10.0000 mg | ORAL_TABLET | Freq: Every day | ORAL | 3 refills | Status: DC

## 2016-08-16 NOTE — Telephone Encounter (Signed)
Refill done for 10mg  of aricept at bedtime. [facility]. Medication change.

## 2016-08-16 NOTE — Telephone Encounter (Signed)
[facility] [phone] (ARICEPT) 5 MG tablet  Written for 5mg , she said insurance prefers 10mg  1 tab at bedtime. Please resend

## 2016-09-07 ENCOUNTER — Other Ambulatory Visit: Payer: Self-pay | Admitting: Cardiovascular Disease

## 2016-09-07 DIAGNOSIS — R0781 Pleurodynia: Secondary | ICD-10-CM

## 2016-09-19 ENCOUNTER — Ambulatory Visit: Payer: Medicare Other | Admitting: Cardiovascular Disease

## 2016-09-21 ENCOUNTER — Encounter: Payer: Self-pay | Admitting: Cardiovascular Disease

## 2016-09-21 ENCOUNTER — Ambulatory Visit (INDEPENDENT_AMBULATORY_CARE_PROVIDER_SITE_OTHER): Payer: Medicare Other | Admitting: Cardiovascular Disease

## 2016-09-21 VITALS — BP 130/70 | HR 69 | Ht 67.0 in | Wt 138.0 lb

## 2016-09-21 DIAGNOSIS — I493 Ventricular premature depolarization: Secondary | ICD-10-CM | POA: Diagnosis not present

## 2016-09-21 DIAGNOSIS — I10 Essential (primary) hypertension: Secondary | ICD-10-CM | POA: Diagnosis not present

## 2016-09-21 NOTE — Patient Instructions (Signed)
Dr Croitoru recommends that you follow-up with him as needed. 

## 2016-09-21 NOTE — Progress Notes (Signed)
Cardiology Office Note    Date:  09/21/2016   ID:  Brandi Hanson, DOB 07-29-1936, MRN [id number]  PCP:  Wilhemena Durie, MD  Cardiologist:   Sanda Klein, MD   Chief Complaint  Patient presents with  . Follow-up    pt c/o chest pain and sob on and off     History of Present Illness:  Brandi Hanson is a 81 y.o. female with a history of hypertension and palpitations. Done well since her last appointment a year ago. The palpitations continue to bother her and she states that their frequency is increasing, although she has difficulty with memory so it's hard to say for sure. She denies dyspnea or chest pain and generally believes that she is healthy. She acknowledges that her memory is deteriorating. She has not had syncope, falls, edema, claudication, exertional angina or dyspnea or focal neurological complaints.    Past Medical History:  Diagnosis Date  . Abdominal distention   . Abdominal pain   . Anxiety   . Arthritis   . Benign essential HTN 01/02/2012  . Breast cancer in situ 07/22/2011  . Cancer ([place])    skin  . Confusion   . Depression   . Generalized headaches   . GERD (gastroesophageal reflux disease)   . Hyperlipidemia   . Hyperlipidemia 01/02/2012  . Hypertension   . Memory loss   . Osteoporosis   . Palpitations   . PVC (premature ventricular contraction)   . Rectal pain   . Sleep apnea     Past Surgical History:  Procedure Laterality Date  . ABDOMINAL HYSTERECTOMY  1978  . [place]   left  . breast cancer  10/2010   ductal carcinoma in situ, left breast  . CATARACT EXTRACTION  2011   bilateral  . FOOT SURGERY  2005  . KIDNEY STONE SURGERY  2011  . ROTATOR CUFF REPAIR  2007    Current Medications: Outpatient Medications Prior to Visit  Medication Sig Dispense Refill  . acetaminophen (TYLENOL) 500 MG tablet Take 500 mg by mouth every 6 (six) hours as needed.    Marland Kitchen alendronate (FOSAMAX) 70 MG tablet Take 1 tablet (70 mg  total) by mouth every 7 (seven) days. Take with a full glass of water on an empty stomach. 4 tablet 11  . aspirin 81 MG tablet ASPIRIN EC LOW DOSE, 81MG  (Oral Tablet Delayed Release)  1 Every Day for 0 days  Quantity: 0.00;  Refills: 0   Ordered :12-May-2010  Abran Richard ;  Started 05-Feb-2009 Active Comments: DX: 272.0    . calcium carbonate (TUMS - DOSED IN MG ELEMENTAL CALCIUM) 500 MG chewable tablet Chew 1 tablet by mouth. PRN    . Cholecalciferol (VITAMIN D3) 1000 UNITS CAPS Take 2 capsules by mouth daily.    . clidinium-chlordiazePOXIDE (LIBRAX) 5-2.5 MG capsule TAKE 1 OR 2 CAPSULES BY MOUTH 3 TIMES A DAY AS NEEDED 540 capsule 1  . donepezil (ARICEPT) 10 MG tablet Take 1 tablet (10 mg total) by mouth at bedtime. 90 tablet 3  . lisinopril (PRINIVIL,ZESTRIL) 5 MG tablet Take 1 tablet (5 mg total) by mouth daily. Please keep 09/19/16 appt for further refills 45 tablet 0  . memantine (NAMENDA) 5 MG tablet Take 1 tablet (5 mg total) by mouth 2 (two) times daily. 180 tablet 3  . phenazopyridine (PYRIDIUM) 95 MG tablet Take 95 mg by mouth 3 (three) times daily as needed for pain.    Marland Kitchen  potassium citrate (UROCIT-K) 10 MEQ (1080 MG) SR tablet Take 10 mEq by mouth. BID    . propranolol (INDERAL) 20 MG tablet Take 2 tablets (40 mg total) by mouth 2 (two) times daily. 360 tablet 3   No facility-administered medications prior to visit.      Allergies:   Sulfa antibiotics   Social History   Social History  . Marital status: Single    Spouse name: N/A  . Number of children: N/A  . Years of education: N/A   Social History Main Topics  . Smoking status: Never Smoker  . Smokeless tobacco: Never Used  . Alcohol use No  . Drug use: No  . Sexual activity: No   Other Topics Concern  . None   Social History Narrative  . None     Family History:  The patient's family history includes Cancer in her father, maternal grandfather, mother, and sister; Crohn's disease in her mother; Heart disease in her  maternal grandmother, paternal grandfather, and paternal grandmother; Hypertension in her father, maternal grandfather, and sister.   ROS:   Please see the history of present illness.    ROS All other systems reviewed and are negative.   PHYSICAL EXAM:   VS:  BP 130/70 (BP Location: Right Arm, Patient Position: Sitting, Cuff Size: Normal)   Pulse 69   Ht 5\' 7"  (1.702 m)   Wt 62.6 kg (138 lb)   BMI 21.61 kg/m    GEN: Well nourished, well developed, in no acute distress  HEENT: normal  Neck: no JVD, carotid bruits, or masses Cardiac: RRR; no murmurs, rubs, or gallops,no edema  Respiratory:  clear to auscultation bilaterally, normal work of breathing GI: soft, nontender, nondistended, + BS MS: no deformity or atrophy  Skin: warm and dry, no rash Neuro:  Alert and Oriented x 3, Strength and sensation are intact Psych: euthymic mood, full affect  Wt Readings from Last 3 Encounters:  09/21/16 62.6 kg (138 lb)  07/31/16 62.6 kg (138 lb)  07/11/16 63 kg (139 lb)      Studies/Labs Reviewed:   EKG:  EKG is ordered today.  The ekg ordered today demonstrates Sinus rhythm, QTC 437 ms, normal tracing  Recent Labs: 01/20/2016: ALT 12; BUN 20.7; Creatinine 0.7; HGB 12.2; Platelets 168; Potassium 4.3; Sodium 141 07/11/2016: TSH 1.010   Lipid Panel    Component Value Date/Time   CHOL 174 10/29/2014   TRIG 119 10/29/2014   HDL 52 10/29/2014   LDLCALC 98 10/29/2014     ASSESSMENT:    1. PVCs   2. Benign essential HTN      PLAN:  In order of problems listed above:  1. PVCs: None present today. Symptoms are not particular troublesome, they tend to be associated with periods of anxiety. If the palpitations are actually more bothersome, I suggested that we could increase her beta blocker and discontinue her other antihypertensive, lisinopril. However she prefers not to make any medication changes at this time. 2. HTN: Controlled current regimen  I don't think she really needs  routine cardiology follow-up at this point, but we'll be delighted to see her again if the device as needed for medication adjustment. My feeling is that in the years to come cognitive issues will likely out shadow for cardiac complaints.  Medication Adjustments/Labs and Tests Ordered: Current medicines are reviewed at length with the patient today.  Concerns regarding medicines are outlined above.  Medication changes, Labs and Tests ordered today are listed in  the Patient Instructions below. Patient Instructions  Dr Sallyanne Kuster recommends that you follow-up with him as needed.    Signed, Sanda Klein, MD  09/21/2016 2:41 PM    [address], [place], [place]  [address] Phone: [phone]; Fax: [phone]

## 2016-11-02 ENCOUNTER — Other Ambulatory Visit: Payer: Self-pay | Admitting: Cardiovascular Disease

## 2016-11-02 ENCOUNTER — Other Ambulatory Visit: Payer: Self-pay

## 2016-11-02 DIAGNOSIS — F419 Anxiety disorder, unspecified: Secondary | ICD-10-CM

## 2016-11-02 DIAGNOSIS — R0781 Pleurodynia: Secondary | ICD-10-CM

## 2016-11-02 NOTE — Telephone Encounter (Signed)
Refill request for generic Librax from [place] [facility]. Please review=aa

## 2016-11-03 MED ORDER — CILIDINIUM-CHLORDIAZEPOXIDE 2.5-5 MG PO CAPS: ORAL_CAPSULE | ORAL | 1 refills | Status: DC

## 2016-11-08 ENCOUNTER — Ambulatory Visit: Payer: Self-pay | Admitting: Family Medicine

## 2016-11-16 ENCOUNTER — Ambulatory Visit (INDEPENDENT_AMBULATORY_CARE_PROVIDER_SITE_OTHER): Payer: Medicare Other | Admitting: Family Medicine

## 2016-11-16 VITALS — BP 112/74 | HR 82 | Temp 98.6°F | Resp 14 | Wt 136.0 lb

## 2016-11-16 DIAGNOSIS — G2581 Restless legs syndrome: Secondary | ICD-10-CM | POA: Diagnosis not present

## 2016-11-16 DIAGNOSIS — F411 Generalized anxiety disorder: Secondary | ICD-10-CM

## 2016-11-16 DIAGNOSIS — M818 Other osteoporosis without current pathological fracture: Secondary | ICD-10-CM | POA: Diagnosis not present

## 2016-11-16 DIAGNOSIS — I6521 Occlusion and stenosis of right carotid artery: Secondary | ICD-10-CM | POA: Diagnosis not present

## 2016-11-16 DIAGNOSIS — K582 Mixed irritable bowel syndrome: Secondary | ICD-10-CM | POA: Diagnosis not present

## 2016-11-16 DIAGNOSIS — F3289 Other specified depressive episodes: Secondary | ICD-10-CM | POA: Diagnosis not present

## 2016-11-16 DIAGNOSIS — R634 Abnormal weight loss: Secondary | ICD-10-CM

## 2016-11-16 DIAGNOSIS — G3184 Mild cognitive impairment, so stated: Secondary | ICD-10-CM | POA: Diagnosis not present

## 2016-11-16 DIAGNOSIS — Z8639 Personal history of other endocrine, nutritional and metabolic disease: Secondary | ICD-10-CM

## 2016-11-16 MED ORDER — SERTRALINE HCL 50 MG PO TABS: 50.0000 mg | ORAL_TABLET | Freq: Every day | ORAL | 12 refills | Status: DC

## 2016-11-16 NOTE — Progress Notes (Signed)
Brandi Hanson  MRN: [id number] DOB: July 31, 1936  Subjective:  HPI  Patient is here for follow up. LOV was 07/11/16 for wellness visit. TSH and A1C checked on 07/11/16. CBC and MetC 01/20/16. Lipid 10/29/14. Lab Results  Component Value Date   HGBA1C 5.7 (H) 07/11/2016   Since that visit patient has followed up with neurologist and cardiologist.  She has a few issues she wanted to discuss today. Per her list they are: Left side of abdomen pain, comes and goes and states she has IBS and has had issues with stomach for years. Constipation and diarrhea off and on. She has had imaging done for this issue. Weight loss. Wt Readings from Last 3 Encounters:  11/16/16 136 lb (61.7 kg)  09/21/16 138 lb (62.6 kg)  07/31/16 138 lb (62.6 kg)   Anxiety, depression, troublesleeping. She use to be on Lorazepam and no other medications per her chart. Also has had trouble with restless leg- at night sometimes has involuntary movements and pain. Headache when she wakes up sometimes. Depression screen [facility] 2/9 11/16/2016 07/11/2016 07/11/2016  Decreased Interest 1 - 1  Down, Depressed, Hopeless 2 - 2  PHQ - 2 Score 3 - 3  Altered sleeping 2 - 1  Tired, decreased energy 0 - 1  Change in appetite 0 - 0  Feeling bad or failure about yourself  1 - 2  Trouble concentrating 3 - 3  Moving slowly or fidgety/restless 0 - 0  Suicidal thoughts 0 - 0  PHQ-9 Score 9 - 10  Difficult doing work/chores - Somewhat difficult -  Some encounter information is confidential and restricted. Go to Review Flowsheets activity to see all data.   BP Readings from Last 3 Encounters:  11/16/16 112/74  09/21/16 130/70  07/31/16 131/73   Patient Active Problem List   Diagnosis Date Noted  . Allergic rhinitis 07/07/2015  . MCI (mild cognitive impairment) with memory loss 03/25/2015  . Depression, major, recurrent, mild ([place]) 03/25/2015  . Generalized anxiety disorder 03/25/2015  . IBS (irritable bowel syndrome)  02/15/2015  . At risk for falling 01/08/2015  . Body mass index (BMI) of 23.0-23.9 in adult 01/08/2015  . H/O malignant neoplasm of breast 01/08/2015  . Chronic constipation 01/08/2015  . Breast cancer of upper-outer quadrant of left female breast ([place]) 01/08/2015  . HZV (herpes zoster virus) post herpetic neuralgia 01/08/2015  . Apnea, sleep 01/08/2015  . Palpitations 08/21/2013  . Benign essential HTN 01/02/2012  . Beat, premature ventricular 04/21/2009  . Absolute anemia 02/05/2009  . Acid reflux 02/05/2009  . Essential (primary) hypertension 02/05/2009  . Bone/cartilage disorder 07/22/2007  . Avitaminosis D 10/08/2006  . Carotid artery obstruction 09/11/1998    Past Medical History:  Diagnosis Date  . Abdominal distention   . Abdominal pain   . Anxiety   . Arthritis   . Benign essential HTN 01/02/2012  . Breast cancer in situ 07/22/2011  . Cancer ([place])    skin  . Confusion   . Depression   . Generalized headaches   . GERD (gastroesophageal reflux disease)   . Hyperlipidemia   . Hyperlipidemia 01/02/2012  . Hypertension   . Memory loss   . Osteoporosis   . Palpitations   . PVC (premature ventricular contraction)   . Rectal pain   . Sleep apnea     Social History   Social History  . Marital status: Single    Spouse name: N/A  . Number of children: N/A  . Years  of education: N/A   Occupational History  . Not on file.   Social History Main Topics  . Smoking status: Never Smoker  . Smokeless tobacco: Never Used  . Alcohol use No  . Drug use: No  . Sexual activity: No   Other Topics Concern  . Not on file   Social History Narrative  . No narrative on file    Outpatient Encounter Prescriptions as of 11/16/2016  Medication Sig Note  . acetaminophen (TYLENOL) 500 MG tablet Take 500 mg by mouth every 6 (six) hours as needed.   Marland Kitchen alendronate (FOSAMAX) 70 MG tablet Take 1 tablet (70 mg total) by mouth every 7 (seven) days. Take with a full glass of water on  an empty stomach.   Marland Kitchen aspirin 81 MG tablet ASPIRIN EC LOW DOSE, 81MG  (Oral Tablet Delayed Release)  1 Every Day for 0 days  Quantity: 0.00;  Refills: 0   Ordered :12-May-2010  Abran Richard ;  Started 05-Feb-2009 Active Comments: DX: 272.0 01/08/2015: DX: 272.0 Received from: [facility]  . calcium carbonate (TUMS - DOSED IN MG ELEMENTAL CALCIUM) 500 MG chewable tablet Chew 1 tablet by mouth. PRN 01/08/2015: Received from: [facility]  . Cholecalciferol (VITAMIN D3) 1000 UNITS CAPS Take 2 capsules by mouth daily. 01/08/2015: Received from: [facility]  . clidinium-chlordiazePOXIDE (LIBRAX) 5-2.5 MG capsule TAKE 1 OR 2 CAPSULES BY MOUTH 3 TIMES A DAY AS NEEDED   . Coenzyme Q10 (CO Q10) 100 MG CAPS Take by mouth daily.   Marland Kitchen donepezil (ARICEPT) 10 MG tablet Take 1 tablet (10 mg total) by mouth at bedtime.   Marland Kitchen lisinopril (PRINIVIL,ZESTRIL) 5 MG tablet TAKE 1 TABLET BY MOUTH DAILY *KEEP APPT FOR 09/19/16 FOR FUTHER REFILLS*   . memantine (NAMENDA) 5 MG tablet Take 1 tablet (5 mg total) by mouth 2 (two) times daily.   . potassium citrate (UROCIT-K) 10 MEQ (1080 MG) SR tablet Take 10 mEq by mouth. BID 01/08/2015: Received from: [facility]  . propranolol (INDERAL) 20 MG tablet Take 2 tablets (40 mg total) by mouth 2 (two) times daily.   . [DISCONTINUED] phenazopyridine (PYRIDIUM) 95 MG tablet Take 95 mg by mouth 3 (three) times daily as needed for pain.    No facility-administered encounter medications on file as of 11/16/2016.     Allergies  Allergen Reactions  . Sulfa Antibiotics Nausea Only    Review of Systems  Constitutional: Positive for malaise/fatigue and weight loss.  Respiratory: Negative.   Cardiovascular: Positive for palpitations.  Gastrointestinal: Positive for abdominal pain, constipation and diarrhea.  Musculoskeletal: Positive for joint pain.       Involuntarily movements at night  Neurological: Positive for weakness  and headaches.  Psychiatric/Behavioral: Positive for depression and memory loss. Negative for hallucinations, substance abuse and suicidal ideas. The patient is nervous/anxious and has insomnia.     Objective:  BP 112/74   Pulse 82   Temp 98.6 F (37 C)   Resp 14   Wt 136 lb (61.7 kg)   BMI 21.30 kg/m   Physical Exam  Constitutional: She is oriented to person, place, and time and well-developed, well-nourished, and in no distress.  HENT:  Head: Normocephalic and atraumatic.  Right Ear: External ear normal.  Left Ear: External ear normal.  Nose: Nose normal.  Eyes: Conjunctivae are normal. Pupils are equal, round, and reactive to light.  Neck: Normal range of motion. Neck supple.  Cardiovascular: Normal rate, regular rhythm, normal heart sounds  and intact distal pulses.   No murmur heard. Pulmonary/Chest: Effort normal and breath sounds normal. No respiratory distress. She has no wheezes.  Abdominal: Soft.  assymetry of the lower abdomen after surgery leaving a scar. Benign finding.  Neurological: She is alert and oriented to person, place, and time.  Skin: Skin is warm and dry.  Psychiatric: Mood and affect normal.    Assessment and Plan :  1. MCI (mild cognitive impairment) with memory loss Following neurologist.  2. Other osteoporosis without current pathological fracture  3. History of hyperglycemia Check labs today.  4. Irritable bowel syndrome with both constipation and diarrhea For years. Stable at this time. Will just follow.  5. Generalized anxiety disorder Worsening. PHQ 9 score today is 9. Try Sertraline and re check on the next visit. (past medications in 2012 included Lorazepam, Prozac and Mirtazapine).  6. Other depression See above plan.  7. Weight loss, unintentional 5 lbs since 09/2015. Will follow for now. This is not abnormal at this time.  8. RLS (restless legs syndrome) For years. Will follow for now and may need to add another medication on  the next visit for this issue. 9.chronic Anxiety  HPI, Exam and A&P transcribed under direction and in the presence of Miguel Aschoff, MD. I have done the exam and reviewed the chart and it is accurate to the best of my knowledge. Development worker, community has been used and  any errors in dictation or transcription are unintentional. Miguel Aschoff M.D. [facility]

## 2016-11-22 LAB — HM DEXA SCAN

## 2016-11-27 ENCOUNTER — Telehealth: Payer: Self-pay | Admitting: Family Medicine

## 2016-11-27 NOTE — Telephone Encounter (Signed)
Ms Brandi Hanson

## 2016-11-27 NOTE — Telephone Encounter (Signed)
Try taking half a pill.

## 2016-11-27 NOTE — Telephone Encounter (Signed)
Pt's sister called saying pt started taking Zoloft and is having some diarrhea since she started taking it.  Please advise  [phone]  Thanks, [name]

## 2016-11-27 NOTE — Telephone Encounter (Signed)
Please review-aa 

## 2016-11-30 LAB — LIPID PANEL WITH LDL/HDL RATIO
Cholesterol, Total: 255 mg/dL — ABNORMAL HIGH (ref 100–199)
HDL: 51 mg/dL (ref >39)
LDL Calculated: 164 mg/dL — ABNORMAL HIGH (ref 0–99)
LDL/HDL RATIO: 3.2 ratio (ref 0.0–3.2)
Triglycerides: 202 mg/dL — ABNORMAL HIGH (ref 0–149)
VLDL Cholesterol Cal: 40 mg/dL (ref 5–40)

## 2016-11-30 LAB — COMPREHENSIVE METABOLIC PANEL
A/G RATIO: 1.7 (ref 1.2–2.2)
ALT: 14 IU/L (ref 0–32)
AST: 17 IU/L (ref 0–40)
Albumin: 4.1 g/dL (ref 3.5–4.8)
Alkaline Phosphatase: 63 IU/L (ref 39–117)
BILIRUBIN TOTAL: 0.4 mg/dL (ref 0.0–1.2)
BUN/Creatinine Ratio: 29 — ABNORMAL HIGH (ref 12–28)
BUN: 19 mg/dL (ref 8–27)
CALCIUM: 9.4 mg/dL (ref 8.7–10.3)
CHLORIDE: 104 mmol/L (ref 96–106)
CO2: 28 mmol/L (ref 18–29)
Creatinine, Ser: 0.66 mg/dL (ref 0.57–1.00)
GFR calc Af Amer: 98 mL/min/{1.73_m2} (ref >59)
GFR, EST NON AFRICAN AMERICAN: 85 mL/min/{1.73_m2} (ref >59)
GLOBULIN, TOTAL: 2.4 g/dL (ref 1.5–4.5)
Glucose: 98 mg/dL (ref 65–99)
POTASSIUM: 5 mmol/L (ref 3.5–5.2)
SODIUM: 145 mmol/L — AB (ref 134–144)
TOTAL PROTEIN: 6.5 g/dL (ref 6.0–8.5)

## 2016-11-30 LAB — HEMOGLOBIN A1C
Est. average glucose Bld gHb Est-mCnc: 114 mg/dL
HEMOGLOBIN A1C: 5.6 % (ref 4.8–5.6)

## 2016-11-30 LAB — CBC WITH DIFFERENTIAL/PLATELET
BASOS: 0 %
Basophils Absolute: 0 10*3/uL (ref 0.0–0.2)
EOS (ABSOLUTE): 0.1 10*3/uL (ref 0.0–0.4)
EOS: 1 %
HEMATOCRIT: 37.3 % (ref 34.0–46.6)
HEMOGLOBIN: 12.7 g/dL (ref 11.1–15.9)
IMMATURE GRANS (ABS): 0 10*3/uL (ref 0.0–0.1)
IMMATURE GRANULOCYTES: 0 %
LYMPHS: 15 %
Lymphocytes Absolute: 0.9 10*3/uL (ref 0.7–3.1)
MCH: 29.8 pg (ref 26.6–33.0)
MCHC: 34 g/dL (ref 31.5–35.7)
MCV: 88 fL (ref 79–97)
MONOCYTES: 12 %
MONOS ABS: 0.7 10*3/uL (ref 0.1–0.9)
NEUTROS PCT: 72 %
Neutrophils Absolute: 3.9 10*3/uL (ref 1.4–7.0)
Platelets: 198 10*3/uL (ref 150–379)
RBC: 4.26 x10E6/uL (ref 3.77–5.28)
RDW: 13 % (ref 12.3–15.4)
WBC: 5.5 10*3/uL (ref 3.4–10.8)

## 2016-11-30 LAB — TSH: TSH: 1.49 u[IU]/mL (ref 0.450–4.500)

## 2016-11-30 NOTE — Progress Notes (Signed)
Advised  ED 

## 2016-12-06 ENCOUNTER — Other Ambulatory Visit: Payer: Self-pay | Admitting: Cardiovascular Disease

## 2016-12-06 NOTE — Telephone Encounter (Signed)
REFILL 

## 2016-12-20 ENCOUNTER — Other Ambulatory Visit: Payer: Self-pay | Admitting: *Deleted

## 2016-12-20 DIAGNOSIS — R0781 Pleurodynia: Secondary | ICD-10-CM

## 2016-12-20 MED ORDER — LISINOPRIL 5 MG PO TABS: 5.0000 mg | ORAL_TABLET | Freq: Every day | ORAL | 10 refills | Status: DC

## 2016-12-20 NOTE — Telephone Encounter (Signed)
Rx(s) sent to pharmacy electronically.  

## 2016-12-22 ENCOUNTER — Encounter: Payer: Self-pay | Admitting: Family Medicine

## 2016-12-22 ENCOUNTER — Ambulatory Visit (INDEPENDENT_AMBULATORY_CARE_PROVIDER_SITE_OTHER): Payer: Medicare Other | Admitting: Family Medicine

## 2016-12-22 VITALS — BP 110/62 | HR 80 | Temp 97.9°F | Resp 16 | Wt 132.0 lb

## 2016-12-22 DIAGNOSIS — R1032 Left lower quadrant pain: Secondary | ICD-10-CM | POA: Diagnosis not present

## 2016-12-22 DIAGNOSIS — R197 Diarrhea, unspecified: Secondary | ICD-10-CM

## 2016-12-22 NOTE — Progress Notes (Signed)
Patient: Brandi Hanson Female    DOB: Apr 28, 1936   81 y.o.   MRN: [id number] Visit Date: 12/22/2016  Today's Provider: Lelon Huh, MD   Chief Complaint  Patient presents with  . Diarrhea   Subjective:    HPI Patient presents today c/o chronic diarrhea. Patient reports that this has been ongoing for over 1 month. She was seen in the office by Dr. Rosanna Randy 1 month ago and reports that all lab tests came back normal. Patient reports that she doesn't think her symptoms are in relation to foods, and she denies any bloody or black stools. Patient reports that she does have PMH of IBS, but feels this is controlled. Patient reports that she noticed that she has lost more weight since she was seen here last month. She states this is not like previous episodes of IBS.  Wt Readings from Last 3 Encounters:  12/22/16 132 lb (59.9 kg)  11/16/16 136 lb (61.7 kg)  09/21/16 138 lb (62.6 kg)   Was started on sertraline in March for anxiety, which was  reduced to 25mg  due to worsening of diarrhea.   Lab Results  Component Value Date   TSH 1.490 11/28/2016   Lab Results  Component Value Date   WBC 5.5 11/28/2016   HGB 12.2 01/20/2016   HCT 37.3 11/28/2016   MCV 88 11/28/2016   PLT 198 11/28/2016   CMP     Component Value Date/Time   NA 145 (H) 11/28/2016 0935   NA 141 01/20/2016 1035   K 5.0 11/28/2016 0935   K 4.3 01/20/2016 1035   CL 104 11/28/2016 0935   CL 105 07/02/2013 1525   CL 105 12/31/2012 1032   CO2 28 11/28/2016 0935   CO2 30 (H) 01/20/2016 1035   GLUCOSE 98 11/28/2016 0935   GLUCOSE 101 01/20/2016 1035   GLUCOSE 101 (H) 12/31/2012 1032   BUN 19 11/28/2016 0935   BUN 20.7 01/20/2016 1035   CREATININE 0.66 11/28/2016 0935   CREATININE 0.7 01/20/2016 1035   CALCIUM 9.4 11/28/2016 0935   CALCIUM 9.5 01/20/2016 1035   PROT 6.5 11/28/2016 0935   PROT 6.4 01/20/2016 1035   ALBUMIN 4.1 11/28/2016 0935   ALBUMIN 3.6 01/20/2016 1035   AST 17 11/28/2016 0935   AST  13 01/20/2016 1035   ALT 14 11/28/2016 0935   ALT 12 01/20/2016 1035   ALKPHOS 63 11/28/2016 0935   ALKPHOS 75 01/20/2016 1035   BILITOT 0.4 11/28/2016 0935   BILITOT 0.47 01/20/2016 1035   GFRNONAA 85 11/28/2016 0935   GFRNONAA >60 07/02/2013 1525   GFRAA 98 11/28/2016 0935   GFRAA >60 07/02/2013 1525    She has appointment with her oncologist on May 3rd.     Allergies  Allergen Reactions  . Sulfa Antibiotics Nausea Only     Current Outpatient Prescriptions:  .  acetaminophen (TYLENOL) 500 MG tablet, Take 500 mg by mouth every 6 (six) hours as needed., Disp: , Rfl:  .  alendronate (FOSAMAX) 70 MG tablet, Take 1 tablet (70 mg total) by mouth every 7 (seven) days. Take with a full glass of water on an empty stomach., Disp: 4 tablet, Rfl: 11 .  aspirin 81 MG tablet, ASPIRIN EC LOW DOSE, 81MG  (Oral Tablet Delayed Release)  1 Every Day for 0 days  Quantity: 0.00;  Refills: 0   Ordered :12-May-2010  Abran Richard ;  Started 05-Feb-2009 Active Comments: DX: 272.0, Disp: , Rfl:  .  calcium carbonate (TUMS - DOSED IN MG ELEMENTAL CALCIUM) 500 MG chewable tablet, Chew 1 tablet by mouth. PRN, Disp: , Rfl:  .  Cholecalciferol (VITAMIN D3) 1000 UNITS CAPS, Take 2 capsules by mouth daily., Disp: , Rfl:  .  clidinium-chlordiazePOXIDE (LIBRAX) 5-2.5 MG capsule, TAKE 1 OR 2 CAPSULES BY MOUTH 3 TIMES A DAY AS NEEDED, Disp: 540 capsule, Rfl: 1 .  Coenzyme Q10 (CO Q10) 100 MG CAPS, Take by mouth daily., Disp: , Rfl:  .  donepezil (ARICEPT) 10 MG tablet, Take 1 tablet (10 mg total) by mouth at bedtime., Disp: 90 tablet, Rfl: 3 .  lisinopril (PRINIVIL,ZESTRIL) 5 MG tablet, Take 1 tablet (5 mg total) by mouth daily., Disp: 30 tablet, Rfl: 10 .  memantine (NAMENDA) 5 MG tablet, Take 1 tablet (5 mg total) by mouth 2 (two) times daily., Disp: 180 tablet, Rfl: 3 .  potassium citrate (UROCIT-K) 10 MEQ (1080 MG) SR tablet, Take 10 mEq by mouth. BID, Disp: , Rfl:  .  propranolol (INDERAL) 20 MG tablet, TAKE 2  TABLETS BY MOUTH 2 TIMES DAILY, Disp: 360 tablet, Rfl: 4 .  sertraline (ZOLOFT) 50 MG tablet, Take 1 tablet (50 mg total) by mouth daily., Disp: 30 tablet, Rfl: 12  Review of Systems  Constitutional: Positive for activity change, appetite change, fatigue and unexpected weight change.  Respiratory: Negative.   Cardiovascular: Negative.   Gastrointestinal: Positive for diarrhea and nausea. Negative for abdominal distention, abdominal pain, anal bleeding, blood in stool, constipation, rectal pain and vomiting.  Neurological: Negative for dizziness and weakness.    Social History  Substance Use Topics  . Smoking status: Never Smoker  . Smokeless tobacco: Never Used  . Alcohol use No   Objective:   BP 110/62 (BP Location: Left Arm, Patient Position: Sitting, Cuff Size: Normal)   Pulse 80   Temp 97.9 F (36.6 C)   Resp 16   Wt 132 lb (59.9 kg)   SpO2 96%   BMI 20.67 kg/m  Vitals:   12/22/16 1501  BP: 110/62  Pulse: 80  Resp: 16  Temp: 97.9 F (36.6 C)  SpO2: 96%  Weight: 132 lb (59.9 kg)     Physical Exam  General Appearance:    Alert, cooperative, no distress  Eyes:    PERRL, conjunctiva/corneas clear, EOM's intact       Lungs:     Clear to auscultation bilaterally, respirations unlabored  Heart:    Regular rate and rhythm  Abdomen:   . No CVA tenderness        Assessment & Plan:     1. Left lower quadrant pain  - CT Abdomen Pelvis W Contrast; Future  2. Diarrhea, unspecified type  - CT Abdomen Pelvis W Contrast; Future  Consider GI referral.       Lelon Huh, MD  [facility]

## 2016-12-25 ENCOUNTER — Telehealth: Payer: Self-pay | Admitting: Family Medicine

## 2016-12-25 DIAGNOSIS — R1032 Left lower quadrant pain: Secondary | ICD-10-CM

## 2016-12-25 DIAGNOSIS — R197 Diarrhea, unspecified: Secondary | ICD-10-CM

## 2016-12-25 NOTE — Telephone Encounter (Signed)
Insurance company would like to speak peer to peer to get CT approved.Phone [phone].Pt has 2 plans with [employer].Member # [id number] AND [id number]

## 2016-12-25 NOTE — Telephone Encounter (Signed)
Please advise 

## 2016-12-26 ENCOUNTER — Telehealth: Payer: Self-pay | Admitting: Family Medicine

## 2016-12-26 NOTE — Telephone Encounter (Signed)
To dr Caryn Section as he saw her for this and knows what is going on.

## 2016-12-26 NOTE — Telephone Encounter (Signed)
Please advise 

## 2016-12-26 NOTE — Telephone Encounter (Signed)
Insurance company requires peer to peer for approval of CT for member # [id number].Her other plan has approved it.Phone number listed in previous message

## 2016-12-27 NOTE — Telephone Encounter (Signed)
Please add order to EPIC °

## 2016-12-27 NOTE — Telephone Encounter (Signed)
Patients insurance will not cover CT unless ultrasound is done first. Please order ultrasound abdomen and pelvis for left lower quadrant pain.

## 2016-12-27 NOTE — Telephone Encounter (Signed)
complete

## 2016-12-27 NOTE — Telephone Encounter (Signed)
Please review. Which one did you want to order-limited or complete?-aa

## 2016-12-28 NOTE — Telephone Encounter (Signed)
Please review

## 2016-12-28 NOTE — Telephone Encounter (Signed)
Order in Epic!

## 2016-12-28 NOTE — Telephone Encounter (Signed)
With pelvic ultrasound an order for transvaginal ultrasound is also needed

## 2016-12-28 NOTE — Telephone Encounter (Signed)
Need order for abdominal and pelvic ultrasound

## 2016-12-28 NOTE — Telephone Encounter (Signed)
thx

## 2017-01-03 ENCOUNTER — Ambulatory Visit
Admission: RE | Admit: 2017-01-03 | Discharge: 2017-01-03 | Disposition: A | Discharge status: Home / Self Care | Payer: Medicare Other | Source: Ambulatory Visit | Attending: Family Medicine | Admitting: Family Medicine

## 2017-01-03 ENCOUNTER — Encounter: Payer: Self-pay | Admitting: Family Medicine

## 2017-01-03 ENCOUNTER — Telehealth: Payer: Self-pay

## 2017-01-03 DIAGNOSIS — I700 Atherosclerosis of aorta: Secondary | ICD-10-CM | POA: Diagnosis not present

## 2017-01-03 DIAGNOSIS — K529 Noninfective gastroenteritis and colitis, unspecified: Secondary | ICD-10-CM

## 2017-01-03 DIAGNOSIS — N200 Calculus of kidney: Secondary | ICD-10-CM | POA: Diagnosis not present

## 2017-01-03 DIAGNOSIS — Z90710 Acquired absence of both cervix and uterus: Secondary | ICD-10-CM | POA: Diagnosis not present

## 2017-01-03 DIAGNOSIS — R197 Diarrhea, unspecified: Secondary | ICD-10-CM | POA: Insufficient documentation

## 2017-01-03 DIAGNOSIS — R1032 Left lower quadrant pain: Secondary | ICD-10-CM

## 2017-01-03 NOTE — Telephone Encounter (Signed)
Mrs Brandi Hanson had called office back stating she was returning Brandi Hanson call in regards to ultrasound of pelvis on patient. I did not see results in chart. Please call back Brandi Hanson when available. KW

## 2017-01-04 NOTE — Telephone Encounter (Signed)
Please schedule referral to GI? Thanks! 

## 2017-01-04 NOTE — Telephone Encounter (Signed)
Patient would like a referral to GI Dr. Laurence Spates in [place].

## 2017-01-09 ENCOUNTER — Other Ambulatory Visit: Payer: Self-pay | Admitting: Gastroenterology

## 2017-01-09 DIAGNOSIS — R1032 Left lower quadrant pain: Secondary | ICD-10-CM

## 2017-01-10 ENCOUNTER — Encounter: Payer: Medicare Other | Admitting: Nurse Practitioner

## 2017-01-10 ENCOUNTER — Ambulatory Visit
Admission: RE | Admit: 2017-01-10 | Discharge: 2017-01-10 | Disposition: A | Discharge status: Home / Self Care | Payer: Medicare Other | Source: Ambulatory Visit | Attending: Gastroenterology | Admitting: Gastroenterology

## 2017-01-10 ENCOUNTER — Other Ambulatory Visit: Payer: Medicare Other

## 2017-01-10 DIAGNOSIS — R1032 Left lower quadrant pain: Secondary | ICD-10-CM

## 2017-01-10 MED ORDER — IOPAMIDOL (ISOVUE-300) INJECTION 61%
100.0000 mL | Freq: Once | INTRAVENOUS | Status: AC | PRN
  Administered 2017-01-10: 100 mL via INTRAVENOUS

## 2017-01-11 ENCOUNTER — Ambulatory Visit (HOSPITAL_BASED_OUTPATIENT_CLINIC_OR_DEPARTMENT_OTHER): Payer: Medicare Other | Admitting: Adult Health

## 2017-01-11 ENCOUNTER — Encounter: Payer: Self-pay | Admitting: Adult Health

## 2017-01-11 VITALS — BP 129/60 | HR 72 | Temp 97.5°F | Resp 18 | Ht 67.0 in | Wt 133.2 lb

## 2017-01-11 DIAGNOSIS — Z853 Personal history of malignant neoplasm of breast: Secondary | ICD-10-CM

## 2017-01-11 DIAGNOSIS — M8580 Other specified disorders of bone density and structure, unspecified site: Secondary | ICD-10-CM

## 2017-01-11 DIAGNOSIS — Z170 Estrogen receptor positive status [ER+]: Principal | ICD-10-CM

## 2017-01-11 DIAGNOSIS — C50412 Malignant neoplasm of upper-outer quadrant of left female breast: Secondary | ICD-10-CM

## 2017-01-11 NOTE — Progress Notes (Signed)
CLINIC:  Survivorship   REASON FOR VISIT:  Routine follow-up for history of breast cancer.   BRIEF ONCOLOGIC HISTORY:    Breast cancer of upper-outer quadrant of left female breast ([place])   10/19/2010 Surgery    Left breast lumpectomy: High-grade DCIS 1.5 cm, necrosis present, ER 95%, PR 0%      11/30/2010 - 01/13/2011 Radiation Therapy    Adjuvant radiation therapy      01/20/2011 - 01/20/2016 Anti-estrogen oral therapy    Femara 5 years        INTERVAL HISTORY:  [name] presents to the [facility] today for routine follow-up for her history of breast cancer.  Overall, she reports feeling quite well. She recently underwent CT and ultrasound of her abdomen due to continued diarrhea x 2 months.  This was ordered by her PCP.  She sees [facility].      REVIEW OF SYSTEMS:  Review of Systems  Constitutional: Negative for appetite change, chills, diaphoresis, fatigue, fever and unexpected weight change.  HENT:   Negative for hearing loss and lump/mass.   Eyes: Negative for eye problems and icterus.  Respiratory: Negative for chest tightness, cough and shortness of breath.   Cardiovascular: Negative for chest pain, leg swelling and palpitations.  Gastrointestinal: Negative for abdominal distention, abdominal pain, blood in stool, constipation, diarrhea and nausea.  Endocrine: Negative for hot flashes.  Genitourinary: Negative for difficulty urinating, dyspareunia, dysuria and frequency.   Musculoskeletal: Negative for arthralgias, back pain and gait problem.  Skin: Negative for itching and rash.  Neurological: Negative for dizziness and gait problem.  Hematological: Negative for adenopathy. Does not bruise/bleed easily.  Psychiatric/Behavioral: Negative for depression. The patient is not nervous/anxious.   Breast: Denies any new nodularity, masses, tenderness, nipple changes, or nipple discharge.    PAST MEDICAL/SURGICAL HISTORY:  Past Medical History:    Diagnosis Date  . Abdominal distention   . Abdominal pain   . Anxiety   . Arthritis   . Benign essential HTN 01/02/2012  . Breast cancer in situ 07/22/2011  . Cancer ([place])    skin  . Confusion   . Depression   . Generalized headaches   . GERD (gastroesophageal reflux disease)   . Hyperlipidemia   . Hyperlipidemia 01/02/2012  . Hypertension   . Memory loss   . Osteoporosis   . Palpitations   . PVC (premature ventricular contraction)   . Rectal pain   . Sleep apnea    Past Surgical History:  Procedure Laterality Date  . ABDOMINAL HYSTERECTOMY  1978  . [place]   left  . breast cancer  10/2010   ductal carcinoma in situ, left breast  . CATARACT EXTRACTION  2011   bilateral  . FOOT SURGERY  2005  . KIDNEY STONE SURGERY  2011  . ROTATOR CUFF REPAIR  2007     ALLERGIES:  Allergies  Allergen Reactions  . Sulfa Antibiotics Nausea Only     CURRENT MEDICATIONS:  Outpatient Encounter Prescriptions as of 01/11/2017  Medication Sig Note  . acetaminophen (TYLENOL) 500 MG tablet Take 500 mg by mouth every 6 (six) hours as needed.   Marland Kitchen alendronate (FOSAMAX) 70 MG tablet Take 1 tablet (70 mg total) by mouth every 7 (seven) days. Take with a full glass of water on an empty stomach.   Marland Kitchen aspirin 81 MG tablet ASPIRIN EC LOW DOSE, 81MG (Oral Tablet Delayed Release)  1 Every Day for 0 days  Quantity: 0.00;  Refills: 0   Ordered :12-May-2010  Abran Richard ;  Started 05-Feb-2009 Active Comments: DX: 272.0 01/08/2015: DX: 272.0 Received from: [facility]  . calcium carbonate (TUMS - DOSED IN MG ELEMENTAL CALCIUM) 500 MG chewable tablet Chew 1 tablet by mouth. PRN 01/08/2015: Received from: [facility]  . Cholecalciferol (VITAMIN D3) 1000 UNITS CAPS Take 2 capsules by mouth daily. 01/08/2015: Received from: [facility]  . clidinium-chlordiazePOXIDE (LIBRAX) 5-2.5 MG capsule TAKE 1 OR 2 CAPSULES BY MOUTH 3 TIMES A DAY AS NEEDED    . Coenzyme Q10 (CO Q10) 100 MG CAPS Take by mouth daily.   Marland Kitchen donepezil (ARICEPT) 10 MG tablet Take 1 tablet (10 mg total) by mouth at bedtime.   Marland Kitchen lisinopril (PRINIVIL,ZESTRIL) 5 MG tablet Take 1 tablet (5 mg total) by mouth daily.   . memantine (NAMENDA) 5 MG tablet Take 1 tablet (5 mg total) by mouth 2 (two) times daily.   . mometasone (ELOCON) 0.1 % lotion    . potassium citrate (UROCIT-K) 10 MEQ (1080 MG) SR tablet Take 10 mEq by mouth. BID 01/08/2015: Received from: [facility]  . propranolol (INDERAL) 20 MG tablet TAKE 2 TABLETS BY MOUTH 2 TIMES DAILY   . sertraline (ZOLOFT) 50 MG tablet Take 1 tablet (50 mg total) by mouth daily.    No facility-administered encounter medications on file as of 01/11/2017.      ONCOLOGIC FAMILY HISTORY:  Family History  Problem Relation Age of Onset  . Cancer Mother     breast  . Crohn's disease Mother   . Cancer Father     lung  . Hypertension Father   . Cancer Sister     breast and uterine  . Hypertension Sister   . Heart disease Maternal Grandmother   . Cancer Maternal Grandfather     colon  . Hypertension Maternal Grandfather   . Heart disease Paternal Grandmother   . Heart disease Paternal Grandfather     GENETIC COUNSELING/TESTING: none  SOCIAL HISTORY:  Brandi Hanson is single and lives alone in [place], [place].  She has no children.  Ms. Melfi is currently retired.  She denies any current or history of tobacco, alcohol, or illicit drug use.     PHYSICAL EXAMINATION:  Vital Signs: Vitals:   01/11/17 1355  BP: 129/60  Pulse: 72  Resp: 18  Temp: 97.5 F (36.4 C)   Filed Weights   01/11/17 1355  Weight: 133 lb 3.2 oz (60.4 kg)   General: Well-nourished, well-appearing female in no acute distress.  Accompanied by her sister today.   HEENT: Head is normocephalic.  Pupils equal and reactive to light. Conjunctivae clear without exudate.  Sclerae anicteric. Oral mucosa is pink, moist.   Oropharynx is pink without lesions or erythema.  Lymph: No cervical, supraclavicular, or infraclavicular lymphadenopathy noted on palpation.  Cardiovascular: Regular rate and rhythm.Marland Kitchen Respiratory: Clear to auscultation bilaterally. Chest expansion symmetric; breathing non-labored.  Breast Exam:  -Left breast: No appreciable masses on palpation. No skin redness, thickening, or peau d'orange appearance; slight nipple retraction, no nipple discharge; mild distortion in symmetry at previous lumpectomy site well healed scar without erythema or nodularity.  -Right breast: No appreciable masses on palpation. No skin redness, thickening, or peau d'orange appearance; no nipple retraction or nipple discharge. -Axilla: No axillary adenopathy bilaterally.  GI: Abdomen soft and round; non-tender, non-distended. Bowel sounds normoactive. No hepatosplenomegaly.   GU: Deferred.  Neuro: No focal deficits. Steady gait.  Psych:  Mood and affect normal and appropriate for situation.  Extremities: No edema. Skin: Warm and dry.  LABORATORY DATA:  None for this visit    DIAGNOSTIC IMAGING:  Most recent mammogram:      ASSESSMENT AND PLAN:  Ms.. Brandi Hanson is a pleasant 81 y.o. female with history of Stage 0 left breast invasive ductal carcinoma, ER+/PR-/HER2-, diagnosed in 10/2010, treated with lumpectomy, adjuvant radiation therapy, and anti-estrogen therapy with Femara x 5 years.  [facility] and routine follow-up.   1. History of breast cancer:  Ms. Demos is currently clinically and radiographically without evidence of disease or recurrence of breast cancer. She will be due for mammogram 06/2017.  I encouraged her to call me with any questions or concerns before her next visit at the cancer center, and I would be happy to see her sooner, if needed.    #. Bone health:  Given Ms. 66 age, history of breast cancer, and her previous anti-estrogen therapy with  Femara, she is at risk for bone demineralization. Her last DEXA scan was in March, 2018 and was consistent with osteopenia, improved from 11/2012.  In the meantime, she was encouraged to increase her consumption of foods rich in calcium, as well as increase her weight-bearing activities.  She was given education on specific food and activities to promote bone health.  #. Cancer screening:  Due to Ms. Skoczylas's history and her age, she should receive screening for skin cancers, colon cancer. She was encouraged to follow-up with her PCP for appropriate cancer screenings.   #. Health maintenance and wellness promotion: Ms. Moxey was encouraged to consume 5-7 servings of fruits and vegetables per day. She was also encouraged to engage in moderate to vigorous exercise for 30 minutes per day most days of the week. She was instructed to limit her alcohol consumption and continue to abstain from tobacco use.    Dispo:  -Return to cancer center in one year for LTS follow up   A total of (30) minutes of face-to-face time was spent with this patient with greater than 50% of that time in counseling and care-coordination.   Gardenia Phlegm, [place] [phone]   Note: PRIMARY CARE PROVIDER Wilhemena Durie, [place] [phone]

## 2017-01-18 ENCOUNTER — Ambulatory Visit (INDEPENDENT_AMBULATORY_CARE_PROVIDER_SITE_OTHER): Payer: Medicare Other | Admitting: Family Medicine

## 2017-01-18 ENCOUNTER — Encounter: Payer: Self-pay | Admitting: Family Medicine

## 2017-01-18 VITALS — BP 134/68 | HR 68 | Temp 97.5°F | Resp 16 | Wt 134.0 lb

## 2017-01-18 DIAGNOSIS — N898 Other specified noninflammatory disorders of vagina: Secondary | ICD-10-CM | POA: Diagnosis not present

## 2017-01-18 DIAGNOSIS — G3184 Mild cognitive impairment, so stated: Secondary | ICD-10-CM | POA: Diagnosis not present

## 2017-01-18 DIAGNOSIS — J301 Allergic rhinitis due to pollen: Secondary | ICD-10-CM

## 2017-01-18 DIAGNOSIS — F411 Generalized anxiety disorder: Secondary | ICD-10-CM

## 2017-01-18 MED ORDER — LORATADINE 10 MG PO TABS: 10.0000 mg | ORAL_TABLET | Freq: Every day | ORAL | 11 refills | Status: DC

## 2017-01-18 NOTE — Progress Notes (Signed)
Subjective:  HPI Pt is here for a 2 month follow up for anxiety. She was started on Sertraline on 11/16/16. She reports that she was only taking a half tablet because of diarrhea but that has gotten better and decided to try taking a full tablet. She is now taking a full tablet with no side effects. She reports that she is doing well on the medication and feels better.  Pt wants to know if boost would be good for her to drink to help her gain some weight. She has a lot of arthritis pain and wants to know what she can do. She does not want to take too much tylenol. She has a runny nose and and she has seen ENT and he gave her something for it but it did not work. It was a nasal spray.   Prior to Admission medications   Medication Sig Start Date End Date Taking? Authorizing Provider  acetaminophen (TYLENOL) 500 MG tablet Take 500 mg by mouth every 6 (six) hours as needed.    [provider]  alendronate (FOSAMAX) 70 MG tablet Take 1 tablet (70 mg total) by mouth every 7 (seven) days. Take with a full glass of water on an empty stomach. 07/11/16   Jerrol Banana., MD  aspirin 81 MG tablet ASPIRIN EC LOW DOSE, 81MG  (Oral Tablet Delayed Release)  1 Every Day for 0 days  Quantity: 0.00;  Refills: 0   Ordered :12-May-2010  Abran Richard ;  Started 05-Feb-2009 Active Comments: DX: 272.0 02/05/09   [provider]  calcium carbonate (TUMS - DOSED IN MG ELEMENTAL CALCIUM) 500 MG chewable tablet Chew 1 tablet by mouth. PRN    [provider]  Cholecalciferol (VITAMIN D3) 1000 UNITS CAPS Take 2 capsules by mouth daily.    [provider]  clidinium-chlordiazePOXIDE (LIBRAX) 5-2.5 MG capsule TAKE 1 OR 2 CAPSULES BY MOUTH 3 TIMES A DAY AS NEEDED 11/03/16   Jerrol Banana., MD  Coenzyme Q10 (CO Q10) 100 MG CAPS Take by mouth daily.    [provider]  donepezil (ARICEPT) 10 MG tablet Take 1 tablet (10 mg total) by mouth at bedtime. 08/16/16   Garvin Fila,  MD  lisinopril (PRINIVIL,ZESTRIL) 5 MG tablet Take 1 tablet (5 mg total) by mouth daily. 12/20/16   Croitoru, Mihai, MD  memantine (NAMENDA) 5 MG tablet Take 1 tablet (5 mg total) by mouth 2 (two) times daily. 05/17/16   Garvin Fila, MD  mometasone (ELOCON) 0.1 % lotion  10/27/16   [provider]  potassium citrate (UROCIT-K) 10 MEQ (1080 MG) SR tablet Take 10 mEq by mouth. BID 10/24/11   [provider]  propranolol (INDERAL) 20 MG tablet TAKE 2 TABLETS BY MOUTH 2 TIMES DAILY 12/06/16   Croitoru, Mihai, MD  sertraline (ZOLOFT) 50 MG tablet Take 1 tablet (50 mg total) by mouth daily. 11/16/16   Jerrol Banana., MD    Patient Active Problem List   Diagnosis Date Noted  . Aortic atherosclerosis ([place]) 01/03/2017  . Renal calculi 01/03/2017  . Allergic rhinitis 07/07/2015  . MCI (mild cognitive impairment) with memory loss 03/25/2015  . Depression, major, recurrent, mild ([place]) 03/25/2015  . Generalized anxiety disorder 03/25/2015  . IBS (irritable bowel syndrome) 02/15/2015  . At risk for falling 01/08/2015  . Body mass index (BMI) of 23.0-23.9 in adult 01/08/2015  . H/O malignant neoplasm of breast 01/08/2015  . Chronic constipation 01/08/2015  .  Breast cancer of upper-outer quadrant of left female breast ([place]) 01/08/2015  . HZV (herpes zoster virus) post herpetic neuralgia 01/08/2015  . Apnea, sleep 01/08/2015  . Palpitations 08/21/2013  . Benign essential HTN 01/02/2012  . Beat, premature ventricular 04/21/2009  . Absolute anemia 02/05/2009  . Acid reflux 02/05/2009  . Essential (primary) hypertension 02/05/2009  . Bone/cartilage disorder 07/22/2007  . Avitaminosis D 10/08/2006  . Carotid artery obstruction 09/11/1998    Past Medical History:  Diagnosis Date  . Abdominal distention   . Abdominal pain   . Anxiety   . Arthritis   . Benign essential HTN 01/02/2012  . Breast cancer in situ 07/22/2011  . Cancer ([place])    skin  . Confusion   . Depression     . Generalized headaches   . GERD (gastroesophageal reflux disease)   . Hyperlipidemia   . Hyperlipidemia 01/02/2012  . Hypertension   . Memory loss   . Osteoporosis   . Palpitations   . PVC (premature ventricular contraction)   . Rectal pain   . Sleep apnea     Social History   Social History  . Marital status: Single    Spouse name: N/A  . Number of children: N/A  . Years of education: N/A   Occupational History  . Not on file.   Social History Main Topics  . Smoking status: Never Smoker  . Smokeless tobacco: Never Used  . Alcohol use No  . Drug use: No  . Sexual activity: No   Other Topics Concern  . Not on file   Social History Narrative  . No narrative on file    Allergies  Allergen Reactions  . Sulfa Antibiotics Nausea Only    Review of Systems  Constitutional: Negative.   HENT: Negative.        Runny nose  Eyes: Negative.   Respiratory: Negative.   Cardiovascular: Negative.   Gastrointestinal: Negative.   Genitourinary: Negative.   Musculoskeletal: Positive for joint pain.  Skin: Negative.   Neurological: Negative.   Endo/Heme/Allergies: Negative.   Psychiatric/Behavioral: The patient is nervous/anxious.     Immunization History  Administered Date(s) Administered  . Influenza, High Dose Seasonal PF 06/08/2014, 07/07/2015  . Influenza-Unspecified 05/16/2013  . Pneumococcal Conjugate-13 10/28/2014  . Pneumococcal Polysaccharide-23 07/10/2003  . Td 03/29/2007  . Zoster 07/10/2007    Objective:  BP 134/68 (BP Location: Left Arm, Patient Position: Sitting, Cuff Size: Normal)   Pulse 68   Temp 97.5 F (36.4 C) (Oral)   Resp 16   Wt 134 lb (60.8 kg)   BMI 20.99 kg/m   Physical Exam  Constitutional: She is oriented to person, place, and time and well-developed, well-nourished, and in no distress.  HENT:  Head: Normocephalic and atraumatic.  Right Ear: External ear normal.  Left Ear: External ear normal.  Nose: Nose normal.  Eyes:  Conjunctivae and EOM are normal. Pupils are equal, round, and reactive to light.  Neck: Normal range of motion. Neck supple.  Cardiovascular: Normal rate, regular rhythm, normal heart sounds and intact distal pulses.   Pulmonary/Chest: Effort normal and breath sounds normal.  Abdominal: Soft.  Genitourinary:  Genitourinary Comments: Mild atrophy of external genitalia.  Neurological: She is alert and oriented to person, place, and time. She has normal reflexes. Gait normal. GCS score is 15.  Skin: Skin is warm and dry.  Psychiatric: Mood, memory, affect and judgment normal.    Lab Results  Component Value Date   WBC 5.5 11/28/2016  HGB 12.2 01/20/2016   HCT 37.3 11/28/2016   PLT 198 11/28/2016   GLUCOSE 98 11/28/2016   CHOL 255 (H) 11/28/2016   TRIG 202 (H) 11/28/2016   HDL 51 11/28/2016   LDLCALC 164 (H) 11/28/2016   TSH 1.490 11/28/2016   HGBA1C 5.6 11/28/2016    CMP     Component Value Date/Time   NA 145 (H) 11/28/2016 0935   NA 141 01/20/2016 1035   K 5.0 11/28/2016 0935   K 4.3 01/20/2016 1035   CL 104 11/28/2016 0935   CL 105 07/02/2013 1525   CL 105 12/31/2012 1032   CO2 28 11/28/2016 0935   CO2 30 (H) 01/20/2016 1035   GLUCOSE 98 11/28/2016 0935   GLUCOSE 101 01/20/2016 1035   GLUCOSE 101 (H) 12/31/2012 1032   BUN 19 11/28/2016 0935   BUN 20.7 01/20/2016 1035   CREATININE 0.66 11/28/2016 0935   CREATININE 0.7 01/20/2016 1035   CALCIUM 9.4 11/28/2016 0935   CALCIUM 9.5 01/20/2016 1035   PROT 6.5 11/28/2016 0935   PROT 6.4 01/20/2016 1035   ALBUMIN 4.1 11/28/2016 0935   ALBUMIN 3.6 01/20/2016 1035   AST 17 11/28/2016 0935   AST 13 01/20/2016 1035   ALT 14 11/28/2016 0935   ALT 12 01/20/2016 1035   ALKPHOS 63 11/28/2016 0935   ALKPHOS 75 01/20/2016 1035   BILITOT 0.4 11/28/2016 0935   BILITOT 0.47 01/20/2016 1035   GFRNONAA 85 11/28/2016 0935   GFRNONAA >60 07/02/2013 1525   GFRAA 98 11/28/2016 0935   GFRAA >60 07/02/2013 1525    Assessment and  Plan :  1. MCI (mild cognitive impairment) with memory loss/Dementia MMSE next visit. 2. Generalized anxiety disorder Improved on Sertraline. Continue.   3. Seasonal allergic rhinitis due to pollen  - loratadine (CLARITIN) 10 MG tablet; Take 1 tablet (10 mg total) by mouth daily.  Dispense: 30 tablet; Refill: 11  4. Vaginal irritation probably related to vaginal ultrasound. May treat with Premarin if not any better. Pt will call.  - POCT urinalysis dipstick   HPI, Exam, and A&P Transcribed under the direction and in the presence of Richard L. Cranford Mon, MD  Electronically Signed: Katina Dung, [place] MD [facility] 01/18/2017 11:42 AM

## 2017-01-18 NOTE — Patient Instructions (Addendum)
you can exchange the allergy medication you bought for Claritin (Loratadine) for your runny nose and allergies

## 2017-01-23 LAB — POCT URINALYSIS DIPSTICK
BILIRUBIN UA: NEGATIVE
Blood, UA: NEGATIVE
GLUCOSE UA: NEGATIVE
KETONES UA: NEGATIVE
Leukocytes, UA: NEGATIVE
Nitrite, UA: NEGATIVE
Protein, UA: NEGATIVE
SPEC GRAV UA: 1.02 (ref 1.010–1.025)
Urobilinogen, UA: 0.2 E.U./dL
pH, UA: 6 (ref 5.0–8.0)

## 2017-01-30 ENCOUNTER — Ambulatory Visit: Payer: Medicare Other | Admitting: Neurology

## 2017-02-14 ENCOUNTER — Ambulatory Visit (INDEPENDENT_AMBULATORY_CARE_PROVIDER_SITE_OTHER): Payer: Medicare Other | Admitting: Neurology

## 2017-02-14 ENCOUNTER — Encounter: Payer: Self-pay | Admitting: Neurology

## 2017-02-14 VITALS — BP 127/69 | HR 69 | Wt 132.0 lb

## 2017-02-14 DIAGNOSIS — F0280 Dementia in other diseases classified elsewhere without behavioral disturbance: Secondary | ICD-10-CM | POA: Diagnosis not present

## 2017-02-14 DIAGNOSIS — G301 Alzheimer's disease with late onset: Secondary | ICD-10-CM | POA: Diagnosis not present

## 2017-02-14 DIAGNOSIS — F0390 Unspecified dementia without behavioral disturbance: Secondary | ICD-10-CM

## 2017-02-14 MED ORDER — MEMANTINE HCL 5 MG PO TABS: 10.0000 mg | ORAL_TABLET | Freq: Two times a day (BID) | ORAL | 3 refills | Status: DC

## 2017-02-14 NOTE — Patient Instructions (Addendum)
I had a long discussion with the patient, her sister and nephew regarding her dementia which appears mildly progressive   on the current dose of Aricept 10 mg daily and Namenda 5 mg twice daily. Recommend increase namemda dose to 5 mg in am and 10 mg in pm x 2 weeks then 10 mg twice daily. I also briefly talked to her about fall and safety precautions and to limit her driving to familiar routes only. I also encouraged the patient to consider possible participation in the [place] early dementia trial and we will give her information to review at home and decide. Greater than 50% time during this 25 minute visit was spent on counseling and coordination of care about her dementia. She will return for follow-up in 3 months or call earlier if necessary

## 2017-02-14 NOTE — Progress Notes (Signed)
[facility] [address] [place]. [place] [address] [phone]       OFFICE FOLLOW UP NOTE  [name] [facility] Date of Birth:  1935/12/01 Medical Record Number:  [id number]   Referring MD:  Brandi Hanson  Reason for Referral:  Memory loss  HPI: 89 year Caucasian lady  having mild progressive memory and cognitive difficulties for last 2 years which have been mildly progressive.She forgets  recent  Events, tasks and at times struggles to find words and finish sentences stopping in midsentence or occcasionally even stuttering..She misplaces things.at times she has been noticed as staring blankly into space and transiently unaware of her surroundings.She has longstanding h/o anxiety and has been on Librax 2.5 mg twice daily  For years and Inderal LA 60 mg has been added recently.She is yet independent in her ADLs and lives alone. There is no h/o headache, seizure, TIA, stroke, significant head injury with loss of consciousness.She has not had any brain imaging or lab work for causes of memory loss or detailed neuropsych testing. Update 08/12/13 : She returns for followup today and states that she did not start taking the fish oil as I had instructed and took only  the samples of Cerefolin I gave her and when she ran out she did not fill the prescription. She states her memory difficulties the about the same and anxiety seems to be increased. She did undergo the tests which I had ordered however. Vitamin B12, TSH and RPR on 06/09/13 were all normal. EEG done and 06/19/13 was normal without any epileptiform features. MRI scan of the brain on 06/20/13 showed mild changes of chronic microvascular ischemia and generalized cerebral atrophy. She admits that she is quite anxious and she has been taking Lipper ask and Inderal for a long time and has seen only her primary physician and perhaps may benefit by referral to a psychiatrist. She did not keep the appointment which we had made  [facility] as she prefers Dr Brandi Hanson in [place] which is shorter drive for her. Update 02/04/2015 : She returns for follow-up today after last visit in December 2014. She is accompanied by sister. She continues to have cognitive difficulties mainly with short-term memory, multitasking, concentration. These subjectively seems to be worse but on objective testing today she actually did not score significantly less than last visit. She however did undergo detailed neuropsych testing on 10/22/14 by Dr. 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 Compared with similar testing by her in 2015 there appeared to be slight worsening. She was started on Aricept 5 mg daily 3 months ago by her primary physician she is tolerating it well without any GI or CNS side effects but the patient feels she is not subjectively any better. She was found to have difficulty with complex visual processing and advised to not drive or limit her driving. The patient informs me that she's not had any problems with driving and on my neurological exam she has full fields of vision and no major neurological deficits. She saw a psychiatrist in [place] but was not happy with him as he did not change her medications. Update 04/15/2015 : She returns for follow-up after last visit 2 months ago a complaint by sister and nephew. She continues to have memory difficulties as well as periods of transient confusion and disorientation off and on. She has now been on Aricept 10 mg for  the last 2 months and seems to be tolerating it reasonably well without significant GI side effects or dizziness. She has been driving without major problems but restricted to short distances. She does have difficulty with remembering directions. She has not had any delusions, hallucinations, falls, balance problems or safety concerns. On Mini-Mental status exam  today she scored 25 which is actually a drop from 29 from last visit. Update 06/23/15 : She returns for follow-up to last visit 2 months ago. She is a compared by her daughter and son. They have noticed some improvement in her memory and confusion. He was not able to tolerate Namenda when she increase the dose to 10 mg twice daily and hence went down to 5 mg twice daily on 05/18/15 which is tolerating much better. She in fact scored 29/30 on the Mini-Mental which is improved from 25/30 at last visit. She still has some good days and bad days. But she is more interactive and can produce patent conversations better. She denies significant nausea, vomiting, diarrhea or dizziness. There have been no major safety of issues. She has some mild impaired balance but has not had major injury or fall. She does not have significant delusions or hallucinations but does occasionally get agitated but can be redirected. She continues to live alone and her daughter lives across the street and has close supervision. She is driving to a limited extent and there have been no accidents. Update 01/21/2016 : She returns for follow-up after last visit 6 months ago. She is accompanied by her daughter and grandson. She continues to do resumed it well. She still has problems with numbers and names and calculations. She still lives alone. Family provides some supervision but mostly she is independent. She has own social circles and does go out with large groups. She does also go to the [facility] 3 days a week for exercise. She remains a bit and likes to do word gambles but does not do another cognitively challenging activities. She walks pretty good and there have been no safety concerns identified. She's not had any recent falls. She has not had any delusions or hallucinations. She does have significant anxiety and does get upset from time to time. She has a prescription for Librax to be taken 3 times a day but she usually takes it not more than  twice. The daughter feels that by late afternoon she is very anxious and may benefit by taking an additional dose at lunchtime She still driving very limited mostly during daytime to family or crowds. Update 07/31/2016 : She returns for follow-up after last visit 6 months ago. She is accompanied by her sister and nephew. Patient continues to have mild cognitive difficulties with short-term memory, forgetting names. At times unable to complete sentences. She also has anxiety. The family feels at times when several people are talking she has trouble focusing and paying attention and can zone out. She gets quite frustrated when there is conversation in a crowd. She still has trouble remembering dates and appointments. Family feels she may also be losing some interest in visiting places and going out of the house. She still continues to drive and has gotten lost a couple of occasions. The sister makes every attempt to be with the patient when she drives. The patient has recently gotten new primary care physician. On Mini-Mental status exam today she scored 30/30 which is unchanged from last visit. She remains on Aricept and Namenda which is tolerating well without side effects. ROS:  14 system review of systems is positive for unexpected weight change, hearing loss, ringing in the ears, runny nose, light sensitivity, constipation, diarrhea, rectal pain, palpitations, restless leg, apnea, daytime sleepiness, painful urination, headache, memory loss, speech difficulty, back and joint pain, aching muscles, muscle cramps, walking difficulty, skin moles, medication,.Confusion, decreased concentration, depression, nervousness and anxiety the systems negative and all other systems negative   PMH:  Past Medical History:  Diagnosis Date  . Abdominal distention   . Abdominal pain   . Anxiety   . Arthritis   . Benign essential HTN 01/02/2012  . Breast cancer in situ 07/22/2011  . Cancer ([place])    skin  . Confusion     . Depression   . Generalized headaches   . GERD (gastroesophageal reflux disease)   . Hyperlipidemia   . Hyperlipidemia 01/02/2012  . Hypertension   . Memory loss   . Osteoporosis   . Palpitations   . PVC (premature ventricular contraction)   . Rectal pain   . Sleep apnea     Social History:  Social History   Social History  . Marital status: Single    Spouse name: N/A  . Number of children: N/A  . Years of education: N/A   Occupational History  . Not on file.   Social History Main Topics  . Smoking status: Never Smoker  . Smokeless tobacco: Never Used  . Alcohol use No  . Drug use: No  . Sexual activity: No   Other Topics Concern  . Not on file   Social History Narrative  . No narrative on file    Medications:   Current Outpatient Prescriptions on File Prior to Visit  Medication Sig Dispense Refill  . acetaminophen (TYLENOL) 500 MG tablet Take 500 mg by mouth every 6 (six) hours as needed.    Marland Kitchen alendronate (FOSAMAX) 70 MG tablet Take 1 tablet (70 mg total) by mouth every 7 (seven) days. Take with a full glass of water on an empty stomach. 4 tablet 11  . aspirin 81 MG tablet ASPIRIN EC LOW DOSE, 81MG  (Oral Tablet Delayed Release)  1 Every Day for 0 days  Quantity: 0.00;  Refills: 0   Ordered :12-May-2010  Abran Richard ;  Started 05-Feb-2009 Active Comments: DX: 272.0    . calcium carbonate (TUMS - DOSED IN MG ELEMENTAL CALCIUM) 500 MG chewable tablet Chew 1 tablet by mouth. PRN    . Cholecalciferol (VITAMIN D3) 1000 UNITS CAPS Take 2 capsules by mouth daily.    . clidinium-chlordiazePOXIDE (LIBRAX) 5-2.5 MG capsule TAKE 1 OR 2 CAPSULES BY MOUTH 3 TIMES A DAY AS NEEDED 540 capsule 1  . Coenzyme Q10 (CO Q10) 100 MG CAPS Take by mouth daily.    Marland Kitchen donepezil (ARICEPT) 10 MG tablet Take 1 tablet (10 mg total) by mouth at bedtime. (Patient taking differently: Take 5 mg by mouth at bedtime. ) 90 tablet 3  . lisinopril (PRINIVIL,ZESTRIL) 5 MG tablet Take 1 tablet (5 mg  total) by mouth daily. 30 tablet 10  . loratadine (CLARITIN) 10 MG tablet Take 1 tablet (10 mg total) by mouth daily. 30 tablet 11  . mometasone (ELOCON) 0.1 % lotion     . potassium citrate (UROCIT-K) 10 MEQ (1080 MG) SR tablet Take 10 mEq by mouth. BID    . propranolol (INDERAL) 20 MG tablet TAKE 2 TABLETS BY MOUTH 2 TIMES DAILY 360 tablet 4  . sertraline (ZOLOFT) 50 MG tablet Take 1 tablet (50 mg total)  by mouth daily. 30 tablet 12   No current facility-administered medications on file prior to visit.     Allergies:   Allergies  Allergen Reactions  . Sulfa Antibiotics Nausea Only    Physical Exam General: frail elderly Caucasian lady, seated, in no evident distress Head: head normocephalic and atraumatic.   Neck: supple with no carotid or supraclavicular bruits Cardiovascular: regular rate and rhythm, no murmurs Musculoskeletal: no deformity Skin:  no rash/petichiae Vascular:  Normal pulses all extremities Vitals:   02/14/17 1116  BP: 127/69  Pulse: 69    Neurologic Exam Mental Status: Awake and fully alert. Oriented to place and time. Recent and remote memory intact. Attention span, concentration and fund of knowledge appropriate. Mood and affect appropriate. MMSE 29/30. Clock drawing 4/4. Animal Naming Test 12.Clock drawing 4/4.  Cranial Nerves: Fundoscopic exam not done.  Pupils equal, briskly reactive to light. Extraocular movements full without nystagmus. Visual fields full to confrontation. Hearing intact. Facial sensation intact. Face, tongue, palate moves normally and symmetrically.  Motor: Normal bulk and tone. Normal strength in all tested extremity muscles. Sensory.: intact to tough and pinprick and vibratory.  Coordination: Rapid alternating movements normal in all extremities. Finger-to-nose and heel-to-shin performed accurately bilaterally. Gait and Station: Arises from chair without difficulty. Stance is normal. Gait demonstrates normal stride length and balance  . Able to heel, toe and tandem walk without difficulty.  Reflexes: 1+ and symmetric. Toes downgoing.      ASSESSMENT: 89 year lady with progressive mild memory difficulties  X 3 years-likely  Mild Alzheimer`s dementia due to  significant progression over last 2 years and some response to Aricept and Namenda    PLAN:  I had a long discussion with the patient, her sister and nephew regarding her dementia which appears mildly progressive   on the current dose of Aricept 10 mg daily and Namenda 5 mg twice daily. Recommend increase namemda dose to 5 mg in am and 10 mg in pm x 2 weeks then 10 mg twice daily. I also briefly talked to her about fall and safety precautions and to limit her driving to familiar routes only. I also encouraged the patient to consider possible participation in the [place] early dementia trial and we will give her information to review at home and decide. Greater than 50% time during this 25 minute visit was spent on counseling and coordination of care about her dementia. She will return for follow-up in 3 months or call earlier if necessary  Antony Contras, MD

## 2017-03-13 ENCOUNTER — Telehealth: Payer: Self-pay | Admitting: Neurology

## 2017-03-13 NOTE — Telephone Encounter (Signed)
Pt sister wanting a call back to discuss the increase in memantine (NAMENDA) 5 MG tablet, please call

## 2017-03-13 NOTE — Telephone Encounter (Signed)
Rn call patients sister on DPR about her sisters memory. [name] stated the patient memory has been different the last two weeks. [name] wanted to make sure of the increase dosage. Rn stated per the titration she is suppose to take 10mg  in the am, and 10mg  in the pm. Pts rx comes in 5mg  dosage. Rn stated she is to take 2 tabs in the am, and 2 tabs in the pm. [name] call patient on her home phone to verify she is taking the right dosage. [name] verified with patient she is taking 2 tabs of 5mg  in the am, and 5mg  in the pm. [name] stated she use to do her sisters pill box in the past but she had some medical issues. [name] stated she will start doing her sisters pill box to make sure she is taking it correctly as order. [name] stated her sister is not having any of the side effects that are listed under namenda. Pt is also taking aricept at bedtime. [name] wanted to know if the med came in 10mg  twice a day. Rn stated they can have he pharmacy to call our office to change to 10mg  bid, so patient can take one pill. Rn recommend to sister to call back in two weeks if she has any concerns. [name] verbalized understanding.

## 2017-05-16 ENCOUNTER — Other Ambulatory Visit: Payer: Self-pay | Admitting: Neurology

## 2017-05-16 DIAGNOSIS — F0280 Dementia in other diseases classified elsewhere without behavioral disturbance: Secondary | ICD-10-CM

## 2017-05-16 DIAGNOSIS — G301 Alzheimer's disease with late onset: Principal | ICD-10-CM

## 2017-05-16 NOTE — Telephone Encounter (Addendum)
Rn call patients sister Brandi Hanson back. Brandi Hanson is on her DPR form. Brandi Hanson stated pt reported to her that since taking the namenda she has been having nightmares,and hallucinations.The symptoms are not every night.Patient is still taking 10mg  in the am,and 10mg  in the pm. Pt has an appt on 05/21/2017 with Dr. Leonie Man. Brandi Hanson thinks the pts memory is not stable on the medication, bu wants some guidance. She thinks pt needs another test. Rn states a message will be sen to Dr. Leonie Man. PTs sister verbalized understanding.

## 2017-05-16 NOTE — Telephone Encounter (Signed)
Patient called office in reference to memantine (NAMENDA) 5 MG tablet she states she has been having nightmares/hullicinations only at night time.  She said she has been seeing people in her room, but then realizes they aren't there. Symptoms has been recently but was unable to provide when they started.  Patient states she noticed symptoms have gotten worse since the increase of the medication.  Pharmacy-  [facility].  Please call.

## 2017-05-16 NOTE — Telephone Encounter (Signed)
Revised. 

## 2017-05-17 NOTE — Telephone Encounter (Signed)
If patient calls back tell her Dr. Leonie Man will see her on 05/21/2017 to discuss the side effects of the namenda. Pt reported having hallucinations with taking the medication.Dr. Leonie Man did not recommend her to stop the medication.He will discuss it on 05/21/2017. Rn left vm for pt to call back .

## 2017-05-17 NOTE — Telephone Encounter (Signed)
Hopefully her symptoms will settle down if not I will address it at upcoming follow-up appointment with me on 05/21/17. If the hallucinations get worse patient may have to cut back on the [facility]

## 2017-05-17 NOTE — Telephone Encounter (Signed)
Pt called back and the message was relayed to her.  She has not requested a call back

## 2017-05-21 ENCOUNTER — Encounter: Payer: Self-pay | Admitting: Neurology

## 2017-05-21 ENCOUNTER — Ambulatory Visit (INDEPENDENT_AMBULATORY_CARE_PROVIDER_SITE_OTHER): Payer: Medicare Other | Admitting: Neurology

## 2017-05-21 VITALS — BP 107/62 | HR 74 | Wt 134.2 lb

## 2017-05-21 DIAGNOSIS — G301 Alzheimer's disease with late onset: Secondary | ICD-10-CM

## 2017-05-21 DIAGNOSIS — F0280 Dementia in other diseases classified elsewhere without behavioral disturbance: Secondary | ICD-10-CM | POA: Diagnosis not present

## 2017-05-21 NOTE — Patient Instructions (Signed)
I had a long discussion the patient, sister and son regarding her Alzheimer's dementia and  subjective worsening of symptoms despite being on maximal medical therapy. Continue Aricept 10 mg daily and Namenda 10 mg twice daily. Patient now is interested in participating in the [place] Alzheimer's study. We will prescreen her and see if she qualifies. She will return for follow-up in 6 months or call earlier if necessary

## 2017-05-21 NOTE — Progress Notes (Signed)
[facility] [address] [place]. [place] [address] [phone]       OFFICE FOLLOW UP NOTE  Ms. Lidia Clavijo [facility] Date of Birth:  1935/12/01 Medical Record Number:  [id number]   Referring MD:  Gaspar Garbe  Reason for Referral:  Memory loss  HPI: 89 year Caucasian lady  having mild progressive memory and cognitive difficulties for last 2 years which have been mildly progressive.She forgets  recent  Events, tasks and at times struggles to find words and finish sentences stopping in midsentence or occcasionally even stuttering..She misplaces things.at times she has been noticed as staring blankly into space and transiently unaware of her surroundings.She has longstanding h/o anxiety and has been on Librax 2.5 mg twice daily  For years and Inderal LA 60 mg has been added recently.She is yet independent in her ADLs and lives alone. There is no h/o headache, seizure, TIA, stroke, significant head injury with loss of consciousness.She has not had any brain imaging or lab work for causes of memory loss or detailed neuropsych testing. Update 08/12/13 : She returns for followup today and states that she did not start taking the fish oil as I had instructed and took only  the samples of Cerefolin I gave her and when she ran out she did not fill the prescription. She states her memory difficulties the about the same and anxiety seems to be increased. She did undergo the tests which I had ordered however. Vitamin B12, TSH and RPR on 06/09/13 were all normal. EEG done and 06/19/13 was normal without any epileptiform features. MRI scan of the brain on 06/20/13 showed mild changes of chronic microvascular ischemia and generalized cerebral atrophy. She admits that she is quite anxious and she has been taking Lipper ask and Inderal for a long time and has seen only her primary physician and perhaps may benefit by referral to a psychiatrist. She did not keep the appointment which we had made  [facility] as she prefers Dr Valentina Shaggy in [place] which is shorter drive for her. Update 02/04/2015 : She returns for follow-up today after last visit in December 2014. She is accompanied by sister. She continues to have cognitive difficulties mainly with short-term memory, multitasking, concentration. These subjectively seems to be worse but on objective testing today she actually did not score significantly less than last visit. She however did undergo detailed neuropsych testing on 10/22/14 by Dr. 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 Compared with similar testing by her in 2015 there appeared to be slight worsening. She was started on Aricept 5 mg daily 3 months ago by her primary physician she is tolerating it well without any GI or CNS side effects but the patient feels she is not subjectively any better. She was found to have difficulty with complex visual processing and advised to not drive or limit her driving. The patient informs me that she's not had any problems with driving and on my neurological exam she has full fields of vision and no major neurological deficits. She saw a psychiatrist in [place] but was not happy with him as he did not change her medications. Update 04/15/2015 : She returns for follow-up after last visit 2 months ago a complaint by sister and nephew. She continues to have memory difficulties as well as periods of transient confusion and disorientation off and on. She has now been on Aricept 10 mg for  the last 2 months and seems to be tolerating it reasonably well without significant GI side effects or dizziness. She has been driving without major problems but restricted to short distances. She does have difficulty with remembering directions. She has not had any delusions, hallucinations, falls, balance problems or safety concerns. On Mini-Mental status exam  today she scored 25 which is actually a drop from 29 from last visit. Update 06/23/15 : She returns for follow-up to last visit 2 months ago. She is a compared by her daughter and son. They have noticed some improvement in her memory and confusion. He was not able to tolerate Namenda when she increase the dose to 10 mg twice daily and hence went down to 5 mg twice daily on 05/18/15 which is tolerating much better. She in fact scored 29/30 on the Mini-Mental which is improved from 25/30 at last visit. She still has some good days and bad days. But she is more interactive and can produce patent conversations better. She denies significant nausea, vomiting, diarrhea or dizziness. There have been no major safety of issues. She has some mild impaired balance but has not had major injury or fall. She does not have significant delusions or hallucinations but does occasionally get agitated but can be redirected. She continues to live alone and her daughter lives across the street and has close supervision. She is driving to a limited extent and there have been no accidents. Update 01/21/2016 : She returns for follow-up after last visit 6 months ago. She is accompanied by her daughter and grandson. She continues to do resumed it well. She still has problems with numbers and names and calculations. She still lives alone. Family provides some supervision but mostly she is independent. She has own social circles and does go out with large groups. She does also go to the [facility] 3 days a week for exercise. She remains a bit and likes to do word gambles but does not do another cognitively challenging activities. She walks pretty good and there have been no safety concerns identified. She's not had any recent falls. She has not had any delusions or hallucinations. She does have significant anxiety and does get upset from time to time. She has a prescription for Librax to be taken 3 times a day but she usually takes it not more than  twice. The daughter feels that by late afternoon she is very anxious and may benefit by taking an additional dose at lunchtime She still driving very limited mostly during daytime to family or crowds. Update 07/31/2016 : She returns for follow-up after last visit 6 months ago. She is accompanied by her sister and nephew. Patient continues to have mild cognitive difficulties with short-term memory, forgetting names. At times unable to complete sentences. She also has anxiety. The family feels at times when several people are talking she has trouble focusing and paying attention and can zone out. She gets quite frustrated when there is conversation in a crowd. She still has trouble remembering dates and appointments. Family feels she may also be losing some interest in visiting places and going out of the house. She still continues to drive and has gotten lost a couple of occasions. The sister makes every attempt to be with the patient when she drives. The patient has recently gotten new primary care physician. On Mini-Mental status exam today she scored 30/30 which is unchanged from last visit. She remains on Aricept and Namenda which is tolerating well without side effects. Update  05/21/2017 : She returns for follow-up after last visit 3 months ago. She is a complaint by sister and son. Patient had some initial trouble tolerating the increased dose of Namenda 10 mg twice daily with some hallucinations but she's done better now in last few months. She however cognitively is about the same. She is having some word finding difficulties and does her physical errors and uses wrong word substitution. She has had not any major issues with agitation not unsafe behavior of falls. She does get some hallucinations at times. The patient's family has now decided to consider participation in the [employer] dementia trial.   ROS:   14 system review of systems is positive for  hearing loss, ringing in the ears, runny nose,  cough, palpitations, diarrhea, constipation, restless leg, apnea, back pain, aching muscles, walking difficulty, skin moles, speech difficulty, tremors, agitation, confusion, depression, nervousness, anxiety, hallucinations and all other systems negative   PMH:  Past Medical History:  Diagnosis Date  . Abdominal distention   . Abdominal pain   . Anxiety   . Arthritis   . Benign essential HTN 01/02/2012  . Breast cancer in situ 07/22/2011  . Cancer ([place])    skin  . Confusion   . Depression   . Generalized headaches   . GERD (gastroesophageal reflux disease)   . Hyperlipidemia   . Hyperlipidemia 01/02/2012  . Hypertension   . Memory loss   . Osteoporosis   . Palpitations   . PVC (premature ventricular contraction)   . Rectal pain   . Sleep apnea     Social History:  Social History   Social History  . Marital status: Single    Spouse name: N/A  . Number of children: N/A  . Years of education: N/A   Occupational History  . Not on file.   Social History Main Topics  . Smoking status: Never Smoker  . Smokeless tobacco: Never Used  . Alcohol use No  . Drug use: No  . Sexual activity: No   Other Topics Concern  . Not on file   Social History Narrative  . No narrative on file    Medications:   Current Outpatient Prescriptions on File Prior to Visit  Medication Sig Dispense Refill  . acetaminophen (TYLENOL) 500 MG tablet Take 500 mg by mouth every 6 (six) hours as needed.    Marland Kitchen alendronate (FOSAMAX) 70 MG tablet Take 1 tablet (70 mg total) by mouth every 7 (seven) days. Take with a full glass of water on an empty stomach. 4 tablet 11  . aspirin 81 MG tablet ASPIRIN EC LOW DOSE, 81MG  (Oral Tablet Delayed Release)  1 Every Day for 0 days  Quantity: 0.00;  Refills: 0   Ordered :12-May-2010  Abran Richard ;  Started 05-Feb-2009 Active Comments: DX: 272.0    . calcium carbonate (TUMS - DOSED IN MG ELEMENTAL CALCIUM) 500 MG chewable tablet Chew 1 tablet by mouth. PRN    .  Cholecalciferol (VITAMIN D3) 1000 UNITS CAPS Take 2 capsules by mouth daily.    . clidinium-chlordiazePOXIDE (LIBRAX) 5-2.5 MG capsule TAKE 1 OR 2 CAPSULES BY MOUTH 3 TIMES A DAY AS NEEDED 540 capsule 1  . Coenzyme Q10 (CO Q10) 100 MG CAPS Take by mouth daily.    Marland Kitchen donepezil (ARICEPT) 10 MG tablet Take 1 tablet (10 mg total) by mouth at bedtime. (Patient taking differently: Take 5 mg by mouth at bedtime. ) 90 tablet 3  . donepezil (ARICEPT) 10 MG tablet  TAKE 1 TABLET BY MOUTH AT BEDTIME 90 tablet 3  . lisinopril (PRINIVIL,ZESTRIL) 5 MG tablet Take 1 tablet (5 mg total) by mouth daily. 30 tablet 10  . loratadine (CLARITIN) 10 MG tablet Take 1 tablet (10 mg total) by mouth daily. 30 tablet 11  . memantine (NAMENDA) 5 MG tablet Take 2 tablets (10 mg total) by mouth 2 (two) times daily. Take 5 mg in am and 10 mg at night x 2 weeks then 10 mg twice daily 180 tablet 3  . mometasone (ELOCON) 0.1 % lotion     . potassium citrate (UROCIT-K) 10 MEQ (1080 MG) SR tablet Take 10 mEq by mouth. BID    . propranolol (INDERAL) 20 MG tablet TAKE 2 TABLETS BY MOUTH 2 TIMES DAILY 360 tablet 4  . sertraline (ZOLOFT) 50 MG tablet Take 1 tablet (50 mg total) by mouth daily. 30 tablet 12   No current facility-administered medications on file prior to visit.     Allergies:   Allergies  Allergen Reactions  . Sulfa Antibiotics Nausea Only    Physical Exam General: frail elderly Caucasian lady, seated, in no evident distress Head: head normocephalic and atraumatic.   Neck: supple with no carotid or supraclavicular bruits Cardiovascular: regular rate and rhythm, no murmurs Musculoskeletal: no deformity Skin:  no rash/petichiae Vascular:  Normal pulses all extremities Vitals:   05/21/17 1042  BP: 107/62  Pulse: 74    Neurologic Exam Mental Status: Awake and fully alert. Oriented to place and time. Recent and remote memory intact. Attention span, concentration and fund of knowledge appropriate. Mood and affect  appropriate. MMSE not done. Clock drawing 4/4. Animal Naming Test 12.  Cranial Nerves: Fundoscopic exam not done.  Pupils equal, briskly reactive to light. Extraocular movements full without nystagmus. Visual fields full to confrontation. Hearing intact. Facial sensation intact. Face, tongue, palate moves normally and symmetrically.  Motor: Normal bulk and tone. Normal strength in all tested extremity muscles. Sensory.: intact to tough and pinprick and vibratory.  Coordination: Rapid alternating movements normal in all extremities. Finger-to-nose and heel-to-shin performed accurately bilaterally. Gait and Station: Arises from chair without difficulty. Stance is normal. Gait demonstrates normal stride length and balance . Able to heel, toe and tandem walk without difficulty.  Reflexes: 1+ and symmetric. Toes downgoing.      ASSESSMENT: 39 year lady with progressive mild memory difficulties  X 3 years-likely  Mild Alzheimer`s dementia due to  significant progression over last 2 years and some response to Aricept and Namenda    PLAN: I had a long discussion the patient, sister and son regarding her Alzheimer's dementia and  subjective worsening of symptoms despite being on maximal medical therapy. Continue Aricept 10 mg daily and Namenda 10 mg twice daily. Patient now is interested in participating in the [place] Alzheimer's study. We will prescreen her and see if she qualifies. She will return for follow-up in 6 months or call earlier if necessary. Greater than 50% time during this 25 minute visit was spent on counseling and coordination of care about her dementia.   Antony Contras, MD

## 2017-05-22 ENCOUNTER — Ambulatory Visit (INDEPENDENT_AMBULATORY_CARE_PROVIDER_SITE_OTHER): Payer: Medicare Other | Admitting: Family Medicine

## 2017-05-22 ENCOUNTER — Encounter: Payer: Self-pay | Admitting: Family Medicine

## 2017-05-22 VITALS — BP 110/56 | HR 66 | Temp 97.7°F | Resp 16 | Wt 133.0 lb

## 2017-05-22 DIAGNOSIS — F411 Generalized anxiety disorder: Secondary | ICD-10-CM

## 2017-05-22 DIAGNOSIS — Z23 Encounter for immunization: Secondary | ICD-10-CM | POA: Diagnosis not present

## 2017-05-22 DIAGNOSIS — G3184 Mild cognitive impairment, so stated: Secondary | ICD-10-CM

## 2017-05-22 DIAGNOSIS — T148XXA Other injury of unspecified body region, initial encounter: Secondary | ICD-10-CM | POA: Diagnosis not present

## 2017-05-22 DIAGNOSIS — R479 Unspecified speech disturbances: Secondary | ICD-10-CM | POA: Diagnosis not present

## 2017-05-22 NOTE — Progress Notes (Signed)
Patient: Brandi Hanson Female    DOB: [birth year]   81 y.o.   MRN: [id number] Visit Date: 05/22/2017  Today's Provider: Wilhemena Durie, MD   Chief Complaint  Patient presents with  . Follow-up   Subjective:    HPI Pt is here for a 4 month follow up she reports that she is doing well and feeling well. Her memory is about the same as before. She is still living alone. There was one time where she left her stove on once that is known. Her sister lives across the street. Sometimes she can not get her words out and gets them mixed up. She did not used to do this. Sister would like to have speech therapy come back out to her home. She also tripped when she was walking and has bruises under her chin. She reports that her chin is still tender and she had cut on her lip but it seems to be getting better and doing well.     Allergies  Allergen Reactions  . Sulfa Antibiotics Nausea Only     Current Outpatient Prescriptions:  .  acetaminophen (TYLENOL) 500 MG tablet, Take 500 mg by mouth every 6 (six) hours as needed., Disp: , Rfl:  .  alendronate (FOSAMAX) 70 MG tablet, Take 1 tablet (70 mg total) by mouth every 7 (seven) days. Take with a full glass of water on an empty stomach., Disp: 4 tablet, Rfl: 11 .  aspirin 81 MG tablet, ASPIRIN EC LOW DOSE, 81MG  (Oral Tablet Delayed Release)  1 Every Day for 0 days  Quantity: 0.00;  Refills: 0   Ordered :12-May-2010  Abran Richard ;  Started 05-Feb-2009 Active Comments: DX: 272.0, Disp: , Rfl:  .  calcium carbonate (TUMS - DOSED IN MG ELEMENTAL CALCIUM) 500 MG chewable tablet, Chew 1 tablet by mouth. PRN, Disp: , Rfl:  .  Cholecalciferol (VITAMIN D3) 1000 UNITS CAPS, Take 2 capsules by mouth daily., Disp: , Rfl:  .  clidinium-chlordiazePOXIDE (LIBRAX) 5-2.5 MG capsule, TAKE 1 OR 2 CAPSULES BY MOUTH 3 TIMES A DAY AS NEEDED, Disp: 540 capsule, Rfl: 1 .  Coenzyme Q10 (CO Q10) 100 MG CAPS, Take by mouth daily., Disp: , Rfl:  .  donepezil (ARICEPT)  10 MG tablet, Take 1 tablet (10 mg total) by mouth at bedtime. (Patient taking differently: Take 5 mg by mouth at bedtime. ), Disp: 90 tablet, Rfl: 3 .  donepezil (ARICEPT) 10 MG tablet, TAKE 1 TABLET BY MOUTH AT BEDTIME, Disp: 90 tablet, Rfl: 3 .  lisinopril (PRINIVIL,ZESTRIL) 5 MG tablet, Take 1 tablet (5 mg total) by mouth daily., Disp: 30 tablet, Rfl: 10 .  loratadine (CLARITIN) 10 MG tablet, Take 1 tablet (10 mg total) by mouth daily., Disp: 30 tablet, Rfl: 11 .  memantine (NAMENDA) 5 MG tablet, Take 2 tablets (10 mg total) by mouth 2 (two) times daily. Take 5 mg in am and 10 mg at night x 2 weeks then 10 mg twice daily, Disp: 180 tablet, Rfl: 3 .  mometasone (ELOCON) 0.1 % lotion, , Disp: , Rfl:  .  potassium citrate (UROCIT-K) 10 MEQ (1080 MG) SR tablet, Take 10 mEq by mouth. BID, Disp: , Rfl:  .  propranolol (INDERAL) 20 MG tablet, TAKE 2 TABLETS BY MOUTH 2 TIMES DAILY, Disp: 360 tablet, Rfl: 4 .  sertraline (ZOLOFT) 50 MG tablet, Take 1 tablet (50 mg total) by mouth daily., Disp: 30 tablet, Rfl: 12  Review of Systems  Constitutional: Negative.   HENT: Negative.   Eyes: Negative.   Respiratory: Negative.   Cardiovascular: Negative.   Gastrointestinal: Negative.   Endocrine: Negative.   Genitourinary: Negative.   Musculoskeletal: Negative.   Skin: Negative.   Allergic/Immunologic: Negative.   Neurological: Positive for speech difficulty.  Hematological: Negative.   Psychiatric/Behavioral: Negative.     Social History  Substance Use Topics  . Smoking status: Never Smoker  . Smokeless tobacco: Never Used  . Alcohol use No   Objective:   BP (!) 110/56 (BP Location: Left Arm, Patient Position: Sitting, Cuff Size: Normal)   Pulse 66   Temp 97.7 F (36.5 C) (Oral)   Resp 16   Wt 133 lb (60.3 kg)   BMI 20.83 kg/m  Vitals:   05/22/17 1130  BP: (!) 110/56  Pulse: 66  Resp: 16  Temp: 97.7 F (36.5 C)  TempSrc: Oral  Weight: 133 lb (60.3 kg)     Physical Exam    Constitutional: She is oriented to person, place, and time. She appears well-developed and well-nourished.  HENT:  Head: Normocephalic and atraumatic.  Eyes: Pupils are equal, round, and reactive to light. Conjunctivae and EOM are normal. No scleral icterus.  Neck: Normal range of motion. Neck supple. No thyromegaly present.  Cardiovascular: Normal rate, regular rhythm, normal heart sounds and intact distal pulses.   Pulmonary/Chest: Effort normal and breath sounds normal.  Abdominal: Soft.  Musculoskeletal: Normal range of motion.  Neurological: She is alert and oriented to person, place, and time. She has normal reflexes.  Skin: Skin is warm and dry.  Psychiatric: She has a normal mood and affect. Her behavior is normal.        Assessment & Plan:     1. MCI (mild cognitive impairment) with memory loss MMSE 29/30 today.   2. Generalized anxiety disorder   3. Need for influenza vaccination  - Flu vaccine HIGH DOSE PF  4. Speech disturbance, unspecified type Possibly related to dementia? - Ambulatory referral to Speech Therapy  5. Abrasion  - Td : Tetanus/diphtheria >7yo Preservative  free     HPI, Exam, and A&P Transcribed under the direction and in the presence of Richard L. Cranford Mon, MD  Electronically Signed: Katina Dung, CMA  I have done the exam and reviewed the above chart and it is accurate to the best of my knowledge. Development worker, community has been used in this note in any air is in the dictation or transcription are unintentional.  Wilhemena Durie, MD  [place]

## 2017-06-11 ENCOUNTER — Ambulatory Visit: Payer: Medicare Other | Attending: Family Medicine | Admitting: Speech Pathology

## 2017-06-11 DIAGNOSIS — R41841 Cognitive communication deficit: Secondary | ICD-10-CM | POA: Insufficient documentation

## 2017-06-11 DIAGNOSIS — R479 Unspecified speech disturbances: Secondary | ICD-10-CM | POA: Diagnosis present

## 2017-06-12 ENCOUNTER — Encounter: Payer: Self-pay | Admitting: Speech Pathology

## 2017-06-12 NOTE — Therapy (Signed)
[place] MAIN [facility] SERVICES [address] [place], [place], [address] Phone: [phone]   Fax:  [phone]  Speech Language Pathology Evaluation  Patient Details  Name: Brandi Hanson MRN: [id number] Date of Birth: 1936/08/10 Referring Provider: Dr. Miguel Aschoff  Encounter Date: 06/11/2017      End of Session - 06/12/17 1018    Visit Number 1   Number of Visits 16   Date for SLP Re-Evaluation 08/10/17   SLP Start Time 1000   SLP Stop Time  1059   SLP Time Calculation (min) 59 min   Activity Tolerance Patient tolerated treatment well      Past Medical History:  Diagnosis Date  . Abdominal distention   . Abdominal pain   . Anxiety   . Arthritis   . Benign essential HTN 01/02/2012  . Breast cancer in situ 07/22/2011  . Cancer ([place])    skin  . Confusion   . Depression   . Generalized headaches   . GERD (gastroesophageal reflux disease)   . Hyperlipidemia   . Hyperlipidemia 01/02/2012  . Hypertension   . Memory loss   . Osteoporosis   . Palpitations   . PVC (premature ventricular contraction)   . Rectal pain   . Sleep apnea     Past Surgical History:  Procedure Laterality Date  . ABDOMINAL HYSTERECTOMY  1978  . [place]   left  . breast cancer  10/2010   ductal carcinoma in situ, left breast  . CATARACT EXTRACTION  2011   bilateral  . FOOT SURGERY  2005  . KIDNEY STONE SURGERY  2011  . ROTATOR CUFF REPAIR  2007    There were no vitals filed for this visit.      Subjective Assessment - 06/12/17 1017    Subjective Pt was pleasant and agreeable to evaluation.   Currently in Pain? No/denies            SLP Evaluation [facility] - 06/12/17 1017      SLP Visit Information   SLP Received On 06/11/17   Onset Date 05/2017   Medical Diagnosis Mild cognitive impairment     Subjective   Subjective Pt was pleasant and cooperative. Agreeable to evaluation   Patient/Family Stated Goal "I have  trouble coming up with what I want to say."     General Information   HPI Per chart review, pt is a 81 y/o female whom was seen recently by PCP for mild cogntive impairment. Pt describes new onset of difficulty getting her words out and gets them mixed up.      Prior Functional Status   Cognitive/Linguistic Baseline Baseline deficits   Baseline deficit details Pt reports she has dementia   Available Support Other (Comment)  Pt's sister lives across the street     Cognition   Overall Cognitive Status Impaired/Different from baseline     Auditory Comprehension   Overall Auditory Comprehension Appears within functional limits for tasks assessed     Reading Comprehension   Reading Status Within funtional limits     Expression   Primary Mode of Expression Verbal     Verbal Expression   Overall Verbal Expression Impaired     Written Expression   Dominant Hand Right   Written Expression Within Functional Limits     Oral Motor/Sensory Function   Overall Oral Motor/Sensory Function Appears within functional limits for tasks assessed     Motor Speech   Overall  Motor Speech Appears within functional limits for tasks assessed     Standardized Assessments   Standardized Assessments  Montreal Cognitive Assessment (MOCA);Western Aphasia Battery      Montreal Cognitive Assessment (MOCA) Version 8.1  Visuospatial/Executive Alternating trail making   0/1 Visuoconstruction Skills (copy 3-d design) 1/1 Draw a Clock      2/3 Naming     3/3 Attention Forward Digit Span    1/1 Backward Digit Span    1/1 Vigilance     1/1 Serial 7's     1/3 Language Repitition     1/2 Verbal Fluency    1/1 Abstraction     2/2 Delayed Recall    0/5 Orientation     3/6  Western Aphasia Battery (Bedside)  Spontaneous Speech Content   10/10 Spontaneous Speech Fluency   9/10 Auditory Verbal Comprehension:  Yes /No Questions     10/10 Sequential Commands    9/10  Repetition      10/10 Object  Naming     10/10                     SLP Education - 06/12/17 1018    Education provided Yes   Education Details Evaluation results and POC   Person(s) Educated Patient   Methods Explanation;Demonstration            SLP Long Term Goals - 06/12/17 1037      SLP LONG TERM GOAL #1   Title Pt will complete high level word finding tasks with 80% acc.   Baseline 0%    Time 8   Period Weeks   Status New     SLP LONG TERM GOAL #2   Title Pt will complete attention, executive function skills and memory strategy activities with 80% acc.    Baseline 0%   Time 8   Period Weeks   Status New     SLP LONG TERM GOAL #3   Title Pt will utilize word finding strategies in conversation to aid with initiation and decrease hesitations and interjections with 80% acc.    Baseline 0 use of strategies   Time 8   Period Weeks   Status New          Plan - 06/12/17 1020    Clinical Impression Statement Pt presents with mild cognitive linguistic deficits and mild expressive language disorder. Pt's cognitive linguistic deficits are c/b deficits in the area of recall; attention; executive function and orientation. Pt demonstrated expressive language deficits mainly in conversational speech and c/b mainly by word finding deficits. Pt's conversational speech can be described as fluent by with frequent hesitations and interjections. Pt did not exhibit deficits in generative naming or confrontational naming. However, pt described that because of word finding deficits in conversation she feels socially isolated and it is "embarassing." Pt also describes feeling "lonesome" and having to seek help from her sister for ADL's. Auditory comprehension, reading and writing all appeared [facility]. Pt would beneift from skilled ST treatment to address expressive language and cognitive linguistic skills to aid with ADL's, functional communication and quality of life for pt.    Speech Therapy Frequency 2x /  week   Duration Other (comment)  8 weeks   Treatment/Interventions Language facilitation;SLP instruction and feedback;Compensatory techniques;Compensatory strategies;Patient/family education;Functional tasks   Potential to Achieve Goals Good   Potential Considerations Previous level of function;Family/community support   Consulted and Agree with Plan of Care Patient;Family member/caregiver   Family Member Consulted Pt's  sister      Patient will benefit from skilled therapeutic intervention in order to improve the following deficits and impairments:   Cognitive communication deficit  Speech disturbance, unspecified type      G-Codes - Jul 04, 2017 1044    Functional Assessment Tool Used Standardized test; clinical judgement   Functional Limitations Spoken language expressive   Spoken Language Expression Current Status [phone]) At least 1 percent but less than 20 percent impaired, limited or restricted   Spoken Language Expression Goal Status (W1191) At least 1 percent but less than 20 percent impaired, limited or restricted   Memory Current Status (Y7829) At least 1 percent but less than 20 percent impaired, limited or restricted   Memory Goal Status (F6213) At least 1 percent but less than 20 percent impaired, limited or restricted      Problem List Patient Active Problem List   Diagnosis Date Noted  . Aortic atherosclerosis ([place]) 01/03/2017  . Renal calculi 01/03/2017  . Allergic rhinitis 07/07/2015  . MCI (mild cognitive impairment) with memory loss 03/25/2015  . Depression, major, recurrent, mild ([place]) 03/25/2015  . Generalized anxiety disorder 03/25/2015  . IBS (irritable bowel syndrome) 02/15/2015  . At risk for falling 01/08/2015  . Body mass index (BMI) of 23.0-23.9 in adult 01/08/2015  . H/O malignant neoplasm of breast 01/08/2015  . Chronic constipation 01/08/2015  . Breast cancer of upper-outer quadrant of left female breast ([place]) 01/08/2015  . HZV (herpes zoster  virus) post herpetic neuralgia 01/08/2015  . Apnea, sleep 01/08/2015  . Palpitations 08/21/2013  . Benign essential HTN 01/02/2012  . Beat, premature ventricular 04/21/2009  . Absolute anemia 02/05/2009  . Acid reflux 02/05/2009  . Essential (primary) hypertension 02/05/2009  . Bone/cartilage disorder 07/22/2007  . Avitaminosis D 10/08/2006  . Carotid artery obstruction 09/11/1998    [place] 07-04-17, 11:17 AM  [place] MAIN [facility] SERVICES [address] [place], [place], [address] Phone: [phone]   Fax:  [phone]  Name: Brandi Hanson MRN: [id number] Date of Birth: January 02, 1936

## 2017-06-19 ENCOUNTER — Other Ambulatory Visit: Payer: Self-pay | Admitting: Family Medicine

## 2017-06-20 LAB — HM MAMMOGRAPHY

## 2017-06-22 ENCOUNTER — Ambulatory Visit: Payer: Medicare Other | Admitting: Speech Pathology

## 2017-06-22 ENCOUNTER — Encounter: Payer: Self-pay | Admitting: Speech Pathology

## 2017-06-22 DIAGNOSIS — R479 Unspecified speech disturbances: Secondary | ICD-10-CM

## 2017-06-22 DIAGNOSIS — R41841 Cognitive communication deficit: Secondary | ICD-10-CM

## 2017-06-22 NOTE — Therapy (Signed)
[place] MAIN [facility] SERVICES [address] [name], [place], [address] Phone: [phone]   Fax:  [phone]  Speech Language Pathology Treatment  Patient Details  Name: Brandi Hanson MRN: [id number] Date of Birth: 01/23/1936 Referring Provider: Dr. Miguel Aschoff  Encounter Date: 06/22/2017      End of Session - 06/22/17 1532    Visit Number 2   Number of Visits 16   Date for SLP Re-Evaluation 08/10/17   SLP Start Time 1937   SLP Stop Time  1500   SLP Time Calculation (min) 45 min   Activity Tolerance Patient tolerated treatment well      Past Medical History:  Diagnosis Date  . Abdominal distention   . Abdominal pain   . Anxiety   . Arthritis   . Benign essential HTN 01/02/2012  . Breast cancer in situ 07/22/2011  . Cancer ([place])    skin  . Confusion   . Depression   . Generalized headaches   . GERD (gastroesophageal reflux disease)   . Hyperlipidemia   . Hyperlipidemia 01/02/2012  . Hypertension   . Memory loss   . Osteoporosis   . Palpitations   . PVC (premature ventricular contraction)   . Rectal pain   . Sleep apnea     Past Surgical History:  Procedure Laterality Date  . ABDOMINAL HYSTERECTOMY  1978  . [place]   left  . breast cancer  10/2010   ductal carcinoma in situ, left breast  . CATARACT EXTRACTION  2011   bilateral  . FOOT SURGERY  2005  . KIDNEY STONE SURGERY  2011  . ROTATOR CUFF REPAIR  2007    There were no vitals filed for this visit.      Subjective Assessment - 06/22/17 1532    Subjective "I have a time with that", referring to maintaining a calendar   Currently in Pain? No/denies               ADULT SLP TREATMENT - 06/22/17 0001      General Information   Behavior/Cognition Alert;Cooperative;Pleasant mood;Other (comment)  anxious   HPI Per chart review, pt is a 81 y/o female whom was seen recently by PCP for mild cogntive impairment. Pt describes new  onset of difficulty getting her words out and gets them mixed up.      Treatment Provided   Treatment provided Cognitive-Linquistic     Pain Assessment   Pain Assessment No/denies pain     Cognitive-Linquistic Treatment   Treatment focused on Cognition;Aphasia;Patient/family/caregiver education   Skilled Treatment WORD FINDING: Rapidly complete a variety of word finding tasks (name to definition, synonyms, name category) with overall 75% accuracy.  Improves to 85% given extra processing time.     Assessment / Recommendations / Plan   Plan Continue with current plan of care     Progression Toward Goals   Progression toward goals Progressing toward goals          SLP Education - 06/22/17 1532    Education provided Yes   Education Details be patient    Person(s) Educated Patient   Methods Explanation   Comprehension Verbalized understanding            SLP Long Term Goals - 06/12/17 1037      SLP LONG TERM GOAL #1   Title Pt will complete high level word finding tasks with 80% acc.   Baseline 0%    Time 8  Period Weeks   Status New     SLP LONG TERM GOAL #2   Title Pt will complete attention, executive function skills and memory strategy activities with 80% acc.    Baseline 0%   Time 8   Period Weeks   Status New     SLP LONG TERM GOAL #3   Title Pt will utilize word finding strategies in conversation to aid with initiation and decrease hesitations and interjections with 80% acc.    Baseline 0 use of strategies   Time 8   Period Weeks   Status New          Plan - 06/22/17 1534    Clinical Impression Statement Patient is able to identify situations that exacerbate word finding difficulties.  She benefits from allowing herself to calmly think through the task to elicit the specific word.     Speech Therapy Frequency 2x / week   Duration Other (comment)   Treatment/Interventions Language facilitation;SLP instruction and feedback;Compensatory  techniques;Compensatory strategies;Patient/family education;Functional tasks   Potential to Achieve Goals Good   Potential Considerations Previous level of function;Family/community support   SLP Home Exercise Plan word finding paper/pencil activities   Consulted and Agree with Plan of Care Patient      Patient will benefit from skilled therapeutic intervention in order to improve the following deficits and impairments:   Cognitive communication deficit  Speech disturbance, unspecified type    Problem List Patient Active Problem List   Diagnosis Date Noted  . Aortic atherosclerosis ([place]) 01/03/2017  . Renal calculi 01/03/2017  . Allergic rhinitis 07/07/2015  . MCI (mild cognitive impairment) with memory loss 03/25/2015  . Depression, major, recurrent, mild ([place]) 03/25/2015  . Generalized anxiety disorder 03/25/2015  . IBS (irritable bowel syndrome) 02/15/2015  . At risk for falling 01/08/2015  . Body mass index (BMI) of 23.0-23.9 in adult 01/08/2015  . H/O malignant neoplasm of breast 01/08/2015  . Chronic constipation 01/08/2015  . Breast cancer of upper-outer quadrant of left female breast ([place]) 01/08/2015  . HZV (herpes zoster virus) post herpetic neuralgia 01/08/2015  . Apnea, sleep 01/08/2015  . Palpitations 08/21/2013  . Benign essential HTN 01/02/2012  . Beat, premature ventricular 04/21/2009  . Absolute anemia 02/05/2009  . Acid reflux 02/05/2009  . Essential (primary) hypertension 02/05/2009  . Bone/cartilage disorder 07/22/2007  . Avitaminosis D 10/08/2006  . Carotid artery obstruction 09/11/1998   Brandi Sea, MS/CCC- SLP  Brandi Miner 06/22/2017, 3:36 PM  [place] MAIN [facility] SERVICES [address] [place], [place], [address] Phone: [phone]   Fax:  [phone]   Name: Brandi Hanson MRN: [id number] Date of Birth: 06-27-1936

## 2017-06-27 ENCOUNTER — Ambulatory Visit: Payer: Medicare Other | Admitting: Speech Pathology

## 2017-06-27 DIAGNOSIS — R479 Unspecified speech disturbances: Secondary | ICD-10-CM

## 2017-06-27 DIAGNOSIS — R41841 Cognitive communication deficit: Secondary | ICD-10-CM

## 2017-06-28 ENCOUNTER — Encounter: Payer: Self-pay | Admitting: Speech Pathology

## 2017-06-28 NOTE — Therapy (Signed)
[place] MAIN [facility] SERVICES [address] [place], [place], [address] Phone: [phone]   Fax:  [phone]  Speech Language Pathology Treatment  Patient Details  Name: Brandi Hanson MRN: [id number] Date of Birth: [birth year] Referring Provider: Dr. Miguel Aschoff  Encounter Date: 06/27/2017      End of Session - 06/28/17 0814    Visit Number 3   Date for SLP Re-Evaluation 08/10/17   SLP Start Time 1110   SLP Stop Time  1200   SLP Time Calculation (min) 50 min   Activity Tolerance Patient tolerated treatment well      Past Medical History:  Diagnosis Date  . Abdominal distention   . Abdominal pain   . Anxiety   . Arthritis   . Benign essential HTN 01/02/2012  . Breast cancer in situ 07/22/2011  . Cancer ([place])    skin  . Confusion   . Depression   . Generalized headaches   . GERD (gastroesophageal reflux disease)   . Hyperlipidemia   . Hyperlipidemia 01/02/2012  . Hypertension   . Memory loss   . Osteoporosis   . Palpitations   . PVC (premature ventricular contraction)   . Rectal pain   . Sleep apnea     Past Surgical History:  Procedure Laterality Date  . ABDOMINAL HYSTERECTOMY  1978  . [place]   left  . breast cancer  10/2010   ductal carcinoma in situ, left breast  . CATARACT EXTRACTION  2011   bilateral  . FOOT SURGERY  2005  . KIDNEY STONE SURGERY  2011  . ROTATOR CUFF REPAIR  2007    There were no vitals filed for this visit.      Subjective Assessment - 06/28/17 0813    Subjective Patient becomes anxious when she cannot find words, although she is maintaining a functional level of accuracy   Currently in Pain? No/denies               ADULT SLP TREATMENT - 06/28/17 0001      General Information   Behavior/Cognition Alert;Cooperative;Pleasant mood;Other (comment)  anxious   HPI Per chart review, pt is a 81 y/o female whom was seen recently by PCP for mild cogntive  impairment. Pt describes new onset of difficulty getting her words out and gets them mixed up.      Treatment Provided   Treatment provided Cognitive-Linquistic     Pain Assessment   Pain Assessment No/denies pain     Cognitive-Linquistic Treatment   Treatment focused on Cognition;Aphasia;Patient/family/caregiver education   Skilled Treatment WORD FINDING: Rapidly complete a variety of word finding tasks (name verb to definition, synonyms, verbalize why an item does not belong in a list) with overall 75% accuracy.  Improves to 85% given extra processing time.     Assessment / Recommendations / Plan   Plan Continue with current plan of care     Progression Toward Goals   Progression toward goals Progressing toward goals          SLP Education - 06/28/17 0814    Education provided Yes   Education Details be patient   Person(s) Educated Patient   Methods Explanation   Comprehension Verbalized understanding            SLP Long Term Goals - 06/12/17 1037      SLP LONG TERM GOAL #1   Title Pt will complete high level word finding tasks with 80% acc.  Baseline 0%    Time 8   Period Weeks   Status New     SLP LONG TERM GOAL #2   Title Pt will complete attention, executive function skills and memory strategy activities with 80% acc.    Baseline 0%   Time 8   Period Weeks   Status New     SLP LONG TERM GOAL #3   Title Pt will utilize word finding strategies in conversation to aid with initiation and decrease hesitations and interjections with 80% acc.    Baseline 0 use of strategies   Time 8   Period Weeks   Status New          Plan - 06/28/17 0815    Clinical Impression Statement Patient is able to identify situations that exacerbate word finding difficulties.  She benefits from allowing herself to calmly think through the task to elicit the specific word.     Speech Therapy Frequency 2x / week   Duration Other (comment)   Potential to Achieve Goals Good    Potential Considerations Previous level of function;Family/community support   SLP Home Exercise Plan word finding paper/pencil activities   Consulted and Agree with Plan of Care Patient      Patient will benefit from skilled therapeutic intervention in order to improve the following deficits and impairments:   Cognitive communication deficit  Speech disturbance, unspecified type    Problem List Patient Active Problem List   Diagnosis Date Noted  . Aortic atherosclerosis ([place]) 01/03/2017  . Renal calculi 01/03/2017  . Allergic rhinitis 07/07/2015  . MCI (mild cognitive impairment) with memory loss 03/25/2015  . Depression, major, recurrent, mild ([place]) 03/25/2015  . Generalized anxiety disorder 03/25/2015  . IBS (irritable bowel syndrome) 02/15/2015  . At risk for falling 01/08/2015  . Body mass index (BMI) of 23.0-23.9 in adult 01/08/2015  . H/O malignant neoplasm of breast 01/08/2015  . Chronic constipation 01/08/2015  . Breast cancer of upper-outer quadrant of left female breast ([place]) 01/08/2015  . HZV (herpes zoster virus) post herpetic neuralgia 01/08/2015  . Apnea, sleep 01/08/2015  . Palpitations 08/21/2013  . Benign essential HTN 01/02/2012  . Beat, premature ventricular 04/21/2009  . Absolute anemia 02/05/2009  . Acid reflux 02/05/2009  . Essential (primary) hypertension 02/05/2009  . Bone/cartilage disorder 07/22/2007  . Avitaminosis D 10/08/2006  . Carotid artery obstruction 09/11/1998   [name], MS/CCC- SLP  Lou Miner 06/28/2017, 8:15 AM  [place] MAIN [facility] SERVICES [address] [place], [place], [address] Phone: [phone]   Fax:  [phone]   Name: Brandi Hanson MRN: [id number] Date of Birth: July 11, 1936

## 2017-06-29 ENCOUNTER — Ambulatory Visit: Payer: Medicare Other | Admitting: Speech Pathology

## 2017-07-04 ENCOUNTER — Ambulatory Visit: Payer: Medicare Other | Admitting: Speech Pathology

## 2017-07-04 DIAGNOSIS — R479 Unspecified speech disturbances: Secondary | ICD-10-CM

## 2017-07-04 DIAGNOSIS — R41841 Cognitive communication deficit: Secondary | ICD-10-CM | POA: Diagnosis not present

## 2017-07-05 ENCOUNTER — Encounter: Payer: Self-pay | Admitting: Speech Pathology

## 2017-07-05 ENCOUNTER — Telehealth: Payer: Self-pay | Admitting: Family Medicine

## 2017-07-05 DIAGNOSIS — G3184 Mild cognitive impairment, so stated: Secondary | ICD-10-CM

## 2017-07-05 NOTE — Therapy (Signed)
[place] MAIN [facility] SERVICES [address] [place], [place], [address] Phone: [phone]   Fax:  [phone]  Speech Language Pathology Treatment  Patient Details  Name: Brandi Hanson MRN: [id number] Date of Birth: 11/07/1935 Referring Provider: Dr. Miguel Aschoff  Encounter Date: 07/04/2017      End of Session - 07/05/17 1152    Visit Number 4   Number of Visits 16   Date for SLP Re-Evaluation 08/10/17   SLP Start Time 1100   SLP Stop Time  3419   SLP Time Calculation (min) 57 min   Activity Tolerance Patient tolerated treatment well      Past Medical History:  Diagnosis Date  . Abdominal distention   . Abdominal pain   . Anxiety   . Arthritis   . Benign essential HTN 01/02/2012  . Breast cancer in situ 07/22/2011  . Cancer ([place])    skin  . Confusion   . Depression   . Generalized headaches   . GERD (gastroesophageal reflux disease)   . Hyperlipidemia   . Hyperlipidemia 01/02/2012  . Hypertension   . Memory loss   . Osteoporosis   . Palpitations   . PVC (premature ventricular contraction)   . Rectal pain   . Sleep apnea     Past Surgical History:  Procedure Laterality Date  . ABDOMINAL HYSTERECTOMY  1978  . [place]   left  . breast cancer  10/2010   ductal carcinoma in situ, left breast  . CATARACT EXTRACTION  2011   bilateral  . FOOT SURGERY  2005  . KIDNEY STONE SURGERY  2011  . ROTATOR CUFF REPAIR  2007    There were no vitals filed for this visit.      Subjective Assessment - 07/05/17 1152    Subjective Patient becomes anxious when she cannot find words, although she is maintaining a functional level of accuracy   Currently in Pain? No/denies               ADULT SLP TREATMENT - 07/05/17 0001      General Information   Behavior/Cognition Alert;Cooperative;Pleasant mood;Other (comment)  anxious   HPI Per chart review, pt is a 81 y/o female whom was seen recently by PCP  for mild cogntive impairment. Pt describes new onset of difficulty getting her words out and gets them mixed up.      Treatment Provided   Treatment provided Cognitive-Linquistic     Pain Assessment   Pain Assessment No/denies pain     Cognitive-Linquistic Treatment   Treatment focused on Cognition;Aphasia;Patient/family/caregiver education   Skilled Treatment WORD FINDING: Rapidly complete a variety of word finding tasks (name adjective to definition, synonyms, verbalize why an item does not belong in a list) with overall 75% accuracy.  Improves to 85% given extra processing time or min cues to clarify vague responses.     Assessment / Recommendations / Plan   Plan Continue with current plan of care     Progression Toward Goals   Progression toward goals Progressing toward goals          SLP Education - 07/05/17 1152    Education provided Yes   Education Details be patient   Person(s) Educated Patient   Methods Explanation   Comprehension Verbalized understanding            SLP Long Term Goals - 06/12/17 1037      SLP LONG TERM GOAL #1  Title Pt will complete high level word finding tasks with 80% acc.   Baseline 0%    Time 8   Period Weeks   Status New     SLP LONG TERM GOAL #2   Title Pt will complete attention, executive function skills and memory strategy activities with 80% acc.    Baseline 0%   Time 8   Period Weeks   Status New     SLP LONG TERM GOAL #3   Title Pt will utilize word finding strategies in conversation to aid with initiation and decrease hesitations and interjections with 80% acc.    Baseline 0 use of strategies   Time 8   Period Weeks   Status New          Plan - 07/05/17 1153    Clinical Impression Statement Patient is able to identify situations that exacerbate word finding difficulties.  She benefits from allowing herself to calmly think through the task to elicit the specific word.     Speech Therapy Frequency 2x / week    Duration Other (comment)   Treatment/Interventions Language facilitation;SLP instruction and feedback;Compensatory techniques;Compensatory strategies;Patient/family education;Functional tasks   Potential to Achieve Goals Good   Potential Considerations Previous level of function;Family/community support   SLP Home Exercise Plan word finding paper/pencil activities   Consulted and Agree with Plan of Care Patient      Patient will benefit from skilled therapeutic intervention in order to improve the following deficits and impairments:   Cognitive communication deficit  Speech disturbance, unspecified type    Problem List Patient Active Problem List   Diagnosis Date Noted  . Aortic atherosclerosis ([place]) 01/03/2017  . Renal calculi 01/03/2017  . Allergic rhinitis 07/07/2015  . MCI (mild cognitive impairment) with memory loss 03/25/2015  . Depression, major, recurrent, mild ([place]) 03/25/2015  . Generalized anxiety disorder 03/25/2015  . IBS (irritable bowel syndrome) 02/15/2015  . At risk for falling 01/08/2015  . Body mass index (BMI) of 23.0-23.9 in adult 01/08/2015  . H/O malignant neoplasm of breast 01/08/2015  . Chronic constipation 01/08/2015  . Breast cancer of upper-outer quadrant of left female breast ([place]) 01/08/2015  . HZV (herpes zoster virus) post herpetic neuralgia 01/08/2015  . Apnea, sleep 01/08/2015  . Palpitations 08/21/2013  . Benign essential HTN 01/02/2012  . Beat, premature ventricular 04/21/2009  . Absolute anemia 02/05/2009  . Acid reflux 02/05/2009  . Essential (primary) hypertension 02/05/2009  . Bone/cartilage disorder 07/22/2007  . Avitaminosis D 10/08/2006  . Carotid artery obstruction 09/11/1998   [name], MS/CCC- SLP  Lou Miner 07/05/2017, 11:53 AM  [place] MAIN [facility] SERVICES [address] [place], [place], [address] Phone: [phone]   Fax:  [phone]   Name: Brandi Hanson MRN: [id number] Date of Birth: September 06, 1936

## 2017-07-05 NOTE — Telephone Encounter (Signed)
Order placed [employer], RMA

## 2017-07-05 NOTE — Telephone Encounter (Signed)
Referral has not been placed. Do you want to proceed? Please advise. Thanks!

## 2017-07-05 NOTE — Telephone Encounter (Signed)
Home Health assessment for dementia

## 2017-07-05 NOTE — Telephone Encounter (Signed)
Brandi Hanson called wanting to know if a referral was put in for home health agency to come out weekly to help the patient with her medication.

## 2017-07-06 ENCOUNTER — Ambulatory Visit: Payer: Medicare Other | Admitting: Speech Pathology

## 2017-07-06 ENCOUNTER — Encounter: Payer: Self-pay | Admitting: Speech Pathology

## 2017-07-06 DIAGNOSIS — R41841 Cognitive communication deficit: Secondary | ICD-10-CM

## 2017-07-06 NOTE — Therapy (Signed)
[place] MAIN [facility] SERVICES [address] [place], [place], [address] Phone: [phone]   Fax:  [phone]  Speech Language Pathology Treatment  Patient Details  Name: Brandi Hanson MRN: [id number] Date of Birth: 06/19/1936 Referring Provider: Dr. Miguel Aschoff  Encounter Date: 07/06/2017      End of Session - 07/06/17 1209    Visit Number 5   Number of Visits 16   Date for SLP Re-Evaluation 08/10/17   SLP Start Time 1055   SLP Stop Time  1155   SLP Time Calculation (min) 60 min   Activity Tolerance Patient tolerated treatment well      Past Medical History:  Diagnosis Date  . Abdominal distention   . Abdominal pain   . Anxiety   . Arthritis   . Benign essential HTN 01/02/2012  . Breast cancer in situ 07/22/2011  . Cancer ([place])    skin  . Confusion   . Depression   . Generalized headaches   . GERD (gastroesophageal reflux disease)   . Hyperlipidemia   . Hyperlipidemia 01/02/2012  . Hypertension   . Memory loss   . Osteoporosis   . Palpitations   . PVC (premature ventricular contraction)   . Rectal pain   . Sleep apnea     Past Surgical History:  Procedure Laterality Date  . ABDOMINAL HYSTERECTOMY  1978  . [place]   left  . breast cancer  10/2010   ductal carcinoma in situ, left breast  . CATARACT EXTRACTION  2011   bilateral  . FOOT SURGERY  2005  . KIDNEY STONE SURGERY  2011  . ROTATOR CUFF REPAIR  2007    There were no vitals filed for this visit.      Subjective Assessment - 07/06/17 1208    Subjective Patient commented "this is hard" and "I don't know if I have the ability to do this" during calendar activity; anxious    Currently in Pain? No/denies   Multiple Pain Sites No               ADULT SLP TREATMENT - 07/06/17 0001      General Information   Behavior/Cognition Alert;Cooperative;Pleasant mood;Other (comment)  anxious   HPI Per chart review, pt is a 81 y/o  female whom was seen recently by PCP for mild cogntive impairment. Pt describes new onset of difficulty getting her words out and gets them mixed up.      Treatment Provided   Treatment provided Cognitive-Linquistic     Pain Assessment   Pain Assessment No/denies pain     Cognitive-Linquistic Treatment   Treatment focused on Cognition   Skilled Treatment Cognition: Completed calendar organization task-placed appointments on monthly calendar and started to place them on weekly calendar-maximum cues needed. Provided word identification (multiple definitions) worksheet to complete at home     Assessment / Recommendations / [place] with current plan of care     Progression Toward Goals   Progression toward goals Progressing toward goals          SLP Education - 07/06/17 1209    Education provided Yes   Education Details straegies for making tasks easier   Person(s) Educated Patient   Methods Explanation   Comprehension Verbalized understanding            SLP Long Term Goals - 06/12/17 1037      SLP LONG TERM GOAL #1   Title Pt  will complete high level word finding tasks with 80% acc.   Baseline 0%    Time 8   Period Weeks   Status New     SLP LONG TERM GOAL #2   Title Pt will complete attention, executive function skills and memory strategy activities with 80% acc.    Baseline 0%   Time 8   Period Weeks   Status New     SLP LONG TERM GOAL #3   Title Pt will utilize word finding strategies in conversation to aid with initiation and decrease hesitations and interjections with 80% acc.    Baseline 0 use of strategies   Time 8   Period Weeks   Status New          Plan - 07/06/17 1210    Clinical Impression Statement Patient is anxious about her abilities to complete tasks. She commented on not wanting to become a "vegetable" but understanding that dementia is progressive. Discussed purpose of ST is to delay progression and to develop strategies to  use.    Speech Therapy Frequency 2x / week   Duration Other (comment)   Treatment/Interventions Language facilitation;SLP instruction and feedback;Compensatory techniques;Compensatory strategies;Patient/family education;Functional tasks   Potential to Achieve Goals Good   Potential Considerations Previous level of function;Family/community support   SLP Home Exercise Plan word identification worksheet   Consulted and Agree with Plan of Care Patient      Patient will benefit from skilled therapeutic intervention in order to improve the following deficits and impairments:   Cognitive communication deficit    Problem List Patient Active Problem List   Diagnosis Date Noted  . Aortic atherosclerosis ([place]) 01/03/2017  . Renal calculi 01/03/2017  . Allergic rhinitis 07/07/2015  . MCI (mild cognitive impairment) with memory loss 03/25/2015  . Depression, major, recurrent, mild ([place]) 03/25/2015  . Generalized anxiety disorder 03/25/2015  . IBS (irritable bowel syndrome) 02/15/2015  . At risk for falling 01/08/2015  . Body mass index (BMI) of 23.0-23.9 in adult 01/08/2015  . H/O malignant neoplasm of breast 01/08/2015  . Chronic constipation 01/08/2015  . Breast cancer of upper-outer quadrant of left female breast ([place]) 01/08/2015  . HZV (herpes zoster virus) post herpetic neuralgia 01/08/2015  . Apnea, sleep 01/08/2015  . Palpitations 08/21/2013  . Benign essential HTN 01/02/2012  . Beat, premature ventricular 04/21/2009  . Absolute anemia 02/05/2009  . Acid reflux 02/05/2009  . Essential (primary) hypertension 02/05/2009  . Bone/cartilage disorder 07/22/2007  . Avitaminosis D 10/08/2006  . Carotid artery obstruction 09/11/1998    Aqeel Norgaard [place] 07/06/2017, 12:10 PM  [place] MAIN [facility] SERVICES [address] [place], [place], [address] Phone: [phone]   Fax:  [phone]   Name: Brandi Hanson MRN: [id number] Date of Birth:  [birth year]

## 2017-07-10 ENCOUNTER — Telehealth: Payer: Self-pay | Admitting: Family Medicine

## 2017-07-10 DIAGNOSIS — R41841 Cognitive communication deficit: Secondary | ICD-10-CM

## 2017-07-10 DIAGNOSIS — R4789 Other speech disturbances: Secondary | ICD-10-CM

## 2017-07-10 NOTE — Telephone Encounter (Signed)
[place] from [facility] called requesting an order for speech therapy.Pt's sister Wynona Canes is wanting this done in home since they will be going out for nursing.The patient is currently getting speech therapy at [facility]

## 2017-07-10 NOTE — Telephone Encounter (Signed)
Ok to order speech therapy

## 2017-07-11 ENCOUNTER — Encounter: Payer: Self-pay | Admitting: Speech Pathology

## 2017-07-11 ENCOUNTER — Ambulatory Visit: Payer: Medicare Other | Admitting: Speech Pathology

## 2017-07-11 DIAGNOSIS — R479 Unspecified speech disturbances: Secondary | ICD-10-CM

## 2017-07-11 DIAGNOSIS — R41841 Cognitive communication deficit: Secondary | ICD-10-CM

## 2017-07-11 NOTE — Telephone Encounter (Signed)
ok 

## 2017-07-11 NOTE — Therapy (Signed)
[place] MAIN [facility] SERVICES [address] [place], [place], [address] Phone: [phone]   Fax:  [phone]  Speech Language Pathology Treatment  Patient Details  Name: Brandi Hanson MRN: [id number] Date of Birth: 13-Dec-1935 Referring Provider: Dr. Miguel Aschoff  Encounter Date: 07/11/2017      End of Session - 07/11/17 1238    Visit Number 6   Number of Visits 16   Date for SLP Re-Evaluation 08/10/17   SLP Start Time 1103   SLP Stop Time  1150   SLP Time Calculation (min) 47 min   Activity Tolerance Patient tolerated treatment well      Past Medical History:  Diagnosis Date  . Abdominal distention   . Abdominal pain   . Anxiety   . Arthritis   . Benign essential HTN 01/02/2012  . Breast cancer in situ 07/22/2011  . Cancer ([place])    skin  . Confusion   . Depression   . Generalized headaches   . GERD (gastroesophageal reflux disease)   . Hyperlipidemia   . Hyperlipidemia 01/02/2012  . Hypertension   . Memory loss   . Osteoporosis   . Palpitations   . PVC (premature ventricular contraction)   . Rectal pain   . Sleep apnea     Past Surgical History:  Procedure Laterality Date  . ABDOMINAL HYSTERECTOMY  1978  . [place]   left  . breast cancer  10/2010   ductal carcinoma in situ, left breast  . CATARACT EXTRACTION  2011   bilateral  . FOOT SURGERY  2005  . KIDNEY STONE SURGERY  2011  . ROTATOR CUFF REPAIR  2007    There were no vitals filed for this visit.      Subjective Assessment - 07/11/17 1237    Subjective Patient commented "this is hard" and "I don't know if I have the ability to do this" about calendar activity; anxious    Currently in Pain? No/denies               ADULT SLP TREATMENT - 07/11/17 0001      General Information   Behavior/Cognition Alert;Cooperative;Pleasant mood;Other (comment)  anxious   HPI Per chart review, pt is a 81 y/o female whom was seen  recently by PCP for mild cogntive impairment. Pt describes new onset of difficulty getting her words out and gets them mixed up.      Treatment Provided   Treatment provided Cognitive-Linquistic     Pain Assessment   Pain Assessment No/denies pain     Cognitive-Linquistic Treatment   Treatment focused on Cognition   Skilled Treatment WORD FINDING: Rapidly complete a variety of word finding tasks (name item given 2 definitions, synonyms, verbalize why an item does not belong in a list) with overall 75% accuracy.  Improves to 85% given extra processing time or min cues to clarify vague responses.     Assessment / Recommendations / Plan   Plan Continue with current plan of care     Progression Toward Goals   Progression toward goals Progressing toward goals          SLP Education - 07/11/17 1238    Education provided Yes   Education Details strategies for making tasks easier   Person(s) Educated Patient   Methods Explanation   Comprehension Verbalized understanding            SLP Long Term Goals - 06/12/17 1037  SLP LONG TERM GOAL #1   Title Pt will complete high level word finding tasks with 80% acc.   Baseline 0%    Time 8   Period Weeks   Status New     SLP LONG TERM GOAL #2   Title Pt will complete attention, executive function skills and memory strategy activities with 80% acc.    Baseline 0%   Time 8   Period Weeks   Status New     SLP LONG TERM GOAL #3   Title Pt will utilize word finding strategies in conversation to aid with initiation and decrease hesitations and interjections with 80% acc.    Baseline 0 use of strategies   Time 8   Period Weeks   Status New          Plan - 07/11/17 1238    Clinical Impression Statement Patient is anxious about her abilities to complete tasks. She commented on not wanting to become a "vegetable" but understanding that dementia is progressive. Discussed purpose of ST is to delay progression and to develop  strategies to use.    Speech Therapy Frequency 2x / week   Duration Other (comment)   Treatment/Interventions Language facilitation;SLP instruction and feedback;Compensatory techniques;Compensatory strategies;Patient/family education;Functional tasks   Potential Considerations Previous level of function;Family/community support   SLP Home Exercise Plan identify wrong category   Consulted and Agree with Plan of Care Patient      Patient will benefit from skilled therapeutic intervention in order to improve the following deficits and impairments:   Cognitive communication deficit  Speech disturbance, unspecified type    Problem List Patient Active Problem List   Diagnosis Date Noted  . Aortic atherosclerosis ([place]) 01/03/2017  . Renal calculi 01/03/2017  . Allergic rhinitis 07/07/2015  . MCI (mild cognitive impairment) with memory loss 03/25/2015  . Depression, major, recurrent, mild ([place]) 03/25/2015  . Generalized anxiety disorder 03/25/2015  . IBS (irritable bowel syndrome) 02/15/2015  . At risk for falling 01/08/2015  . Body mass index (BMI) of 23.0-23.9 in adult 01/08/2015  . H/O malignant neoplasm of breast 01/08/2015  . Chronic constipation 01/08/2015  . Breast cancer of upper-outer quadrant of left female breast ([place]) 01/08/2015  . HZV (herpes zoster virus) post herpetic neuralgia 01/08/2015  . Apnea, sleep 01/08/2015  . Palpitations 08/21/2013  . Benign essential HTN 01/02/2012  . Beat, premature ventricular 04/21/2009  . Absolute anemia 02/05/2009  . Acid reflux 02/05/2009  . Essential (primary) hypertension 02/05/2009  . Bone/cartilage disorder 07/22/2007  . Avitaminosis D 10/08/2006  . Carotid artery obstruction 09/11/1998   [name], MS/CCC- SLP  Lou Miner 07/11/2017, 12:39 PM  [place] MAIN [facility] SERVICES [address] [place], [place], [address] Phone: [phone]   Fax:  [phone]   Name:  Brandi Hanson MRN: [id number] Date of Birth: April 25, 1936

## 2017-07-12 NOTE — Telephone Encounter (Signed)
Order placed-Brandi Hanson V Brandi Hanson, RMA  

## 2017-07-13 ENCOUNTER — Encounter: Payer: Self-pay | Admitting: Speech Pathology

## 2017-07-13 ENCOUNTER — Ambulatory Visit: Payer: Medicare Other | Attending: Family Medicine | Admitting: Speech Pathology

## 2017-07-13 DIAGNOSIS — R41841 Cognitive communication deficit: Secondary | ICD-10-CM | POA: Diagnosis not present

## 2017-07-13 NOTE — Therapy (Signed)
[place] MAIN [facility] SERVICES [address] [place], [place], [address] Phone: [phone]   Fax:  [phone]  Speech Language Pathology Treatment  Patient Details  Name: Brandi Hanson MRN: [id number] Date of Birth: [birth year] Referring Provider: Dr. Miguel Aschoff  Encounter Date: 07/13/2017      End of Session - 07/13/17 1244    Visit Number 7   Number of Visits 16   Date for SLP Re-Evaluation 08/10/17   SLP Start Time 1100   SLP Stop Time  7654   SLP Time Calculation (min) 58 min   Activity Tolerance Patient tolerated treatment well      Past Medical History:  Diagnosis Date  . Abdominal distention   . Abdominal pain   . Anxiety   . Arthritis   . Benign essential HTN 01/02/2012  . Breast cancer in situ 07/22/2011  . Cancer ([place])    skin  . Confusion   . Depression   . Generalized headaches   . GERD (gastroesophageal reflux disease)   . Hyperlipidemia   . Hyperlipidemia 01/02/2012  . Hypertension   . Memory loss   . Osteoporosis   . Palpitations   . PVC (premature ventricular contraction)   . Rectal pain   . Sleep apnea     Past Surgical History:  Procedure Laterality Date  . ABDOMINAL HYSTERECTOMY  1978  . [place]   left  . breast cancer  10/2010   ductal carcinoma in situ, left breast  . CATARACT EXTRACTION  2011   bilateral  . FOOT SURGERY  2005  . KIDNEY STONE SURGERY  2011  . ROTATOR CUFF REPAIR  2007    There were no vitals filed for this visit.      Subjective Assessment - 07/13/17 1243    Subjective Patient willingly participated in activity but commented "this is really hard for me"    Currently in Pain? No/denies               ADULT SLP TREATMENT - 07/13/17 0001      General Information   Behavior/Cognition Alert;Cooperative;Pleasant mood;Other (comment)  anxious   HPI Per chart review, pt is a 81 y/o female whom was seen recently by PCP for mild cogntive  impairment. Pt describes new onset of difficulty getting her words out and gets them mixed up.      Treatment Provided   Treatment provided Cognitive-Linquistic     Pain Assessment   Pain Assessment No/denies pain     Cognitive-Linquistic Treatment   Treatment focused on Cognition   Skilled Treatment Cognition: Completed calendar organization task-placed appointments on monthly calendar-completed task much faster than previous attempt and with moderate cues. Transferred appointments from monthly calendar to daily calendar-moderate cues needed.      Assessment / Recommendations / Plan   Plan Continue with current plan of care     Progression Toward Goals   Progression toward goals Progressing toward goals          SLP Education - 07/13/17 1243    Education provided Yes   Education Details strategies for making tasks easier    Person(s) Educated Patient   Methods Explanation   Comprehension Verbalized understanding            SLP Long Term Goals - 06/12/17 1037      SLP LONG TERM GOAL #1   Title Pt will complete high level word finding tasks with 80% acc.  Baseline 0%    Time 8   Period Weeks   Status New     SLP LONG TERM GOAL #2   Title Pt will complete attention, executive function skills and memory strategy activities with 80% acc.    Baseline 0%   Time 8   Period Weeks   Status New     SLP LONG TERM GOAL #3   Title Pt will utilize word finding strategies in conversation to aid with initiation and decrease hesitations and interjections with 80% acc.    Baseline 0 use of strategies   Time 8   Period Weeks   Status New          Plan - 07/13/17 1245    Clinical Impression Statement Patient is anxious about her abilities to complete tasks, and was still hesitant and unsure of herself despite the improvements she made on the calendar task. Discussed purpose of ST is to delay progression and to develop strategies to use.   Speech Therapy Frequency 2x /  week   Duration Other (comment)   Treatment/Interventions Language facilitation;SLP instruction and feedback;Compensatory techniques;Compensatory strategies;Patient/family education;Functional tasks   Potential Considerations Previous level of function;Family/community support   SLP Home Exercise Plan practice strategies at home    Consulted and Agree with Plan of Care Patient      Patient will benefit from skilled therapeutic intervention in order to improve the following deficits and impairments:   Cognitive communication deficit    Problem List Patient Active Problem List   Diagnosis Date Noted  . Aortic atherosclerosis ([place]) 01/03/2017  . Renal calculi 01/03/2017  . Allergic rhinitis 07/07/2015  . MCI (mild cognitive impairment) with memory loss 03/25/2015  . Depression, major, recurrent, mild ([place]) 03/25/2015  . Generalized anxiety disorder 03/25/2015  . IBS (irritable bowel syndrome) 02/15/2015  . At risk for falling 01/08/2015  . Body mass index (BMI) of 23.0-23.9 in adult 01/08/2015  . H/O malignant neoplasm of breast 01/08/2015  . Chronic constipation 01/08/2015  . Breast cancer of upper-outer quadrant of left female breast ([place]) 01/08/2015  . HZV (herpes zoster virus) post herpetic neuralgia 01/08/2015  . Apnea, sleep 01/08/2015  . Palpitations 08/21/2013  . Benign essential HTN 01/02/2012  . Beat, premature ventricular 04/21/2009  . Absolute anemia 02/05/2009  . Acid reflux 02/05/2009  . Essential (primary) hypertension 02/05/2009  . Bone/cartilage disorder 07/22/2007  . Avitaminosis D 10/08/2006  . Carotid artery obstruction 09/11/1998    Brandi Hanson Brandi Hanson 07/13/2017, 12:48 PM  This entire session was performed under direct supervision and direction of a licensed therapist/therapist assistant . I have personally read, edited and approve of the note as written.  Brandi Hanson, [place], [place] MAIN [facility] SERVICES [address] [place], [place], [address] Phone: [phone]   Fax:  [phone]   Name: Brandi Hanson MRN: [id number] Date of Birth: 1935-09-16

## 2017-07-17 ENCOUNTER — Ambulatory Visit: Payer: Medicare Other | Admitting: Speech Pathology

## 2017-07-17 ENCOUNTER — Encounter: Payer: Self-pay | Admitting: Speech Pathology

## 2017-07-17 DIAGNOSIS — R41841 Cognitive communication deficit: Secondary | ICD-10-CM

## 2017-07-17 NOTE — Therapy (Signed)
[place] MAIN [facility] SERVICES [address] [place], [place], [address] Phone: [phone]   Fax:  [phone]  Speech Language Pathology Treatment  Patient Details  Name: Brandi Hanson MRN: [id number] Date of Birth: [birth year] Referring Provider: Dr. Miguel Aschoff   Encounter Date: 07/17/2017  End of Session - 07/17/17 1356    Visit Number  8    Number of Visits  16    Date for SLP Re-Evaluation  08/10/17    SLP Start Time  1306    SLP Stop Time   1351    SLP Time Calculation (min)  45 min    Activity Tolerance  Patient tolerated treatment well       Past Medical History:  Diagnosis Date  . Abdominal distention   . Abdominal pain   . Anxiety   . Arthritis   . Benign essential HTN 01/02/2012  . Breast cancer in situ 07/22/2011  . Cancer ([place])    skin  . Confusion   . Depression   . Generalized headaches   . GERD (gastroesophageal reflux disease)   . Hyperlipidemia   . Hyperlipidemia 01/02/2012  . Hypertension   . Memory loss   . Osteoporosis   . Palpitations   . PVC (premature ventricular contraction)   . Rectal pain   . Sleep apnea     Past Surgical History:  Procedure Laterality Date  . ABDOMINAL HYSTERECTOMY  1978  . [place]   left  . breast cancer  10/2010   ductal carcinoma in situ, left breast  . CATARACT EXTRACTION  2011   bilateral  . FOOT SURGERY  2005  . KIDNEY STONE SURGERY  2011  . ROTATOR CUFF REPAIR  2007    There were no vitals filed for this visit.  Subjective Assessment - 07/17/17 1355    Subjective  Patient commented on being "afraid of this dementia"    Currently in Pain?  No/denies            ADULT SLP TREATMENT - 07/17/17 0001      General Information   Behavior/Cognition  Alert;Cooperative;Pleasant mood;Other (comment) anxious   anxious   HPI  Per chart review, pt is a 81 y/o female whom was seen recently by PCP for mild cogntive impairment. Pt describes  new onset of difficulty getting her words out and gets them mixed up.       Treatment Provided   Treatment provided  Cognitive-Linquistic      Pain Assessment   Pain Assessment  No/denies pain      Cognitive-Linquistic Treatment   Treatment focused on  Cognition    Skilled Treatment  Cognition: Completed calendar organization task-placed appointments on monthly calendar-needed much greater cueing initially compared to last session, with overall fading cue throughout activity. Answered questions about dates with 100% accuracy (ex. "What do you have to do on 5/19?")      Assessment / Recommendations / Plan   Plan  Continue with current plan of care      Progression Toward Goals   Progression toward goals  Progressing toward goals       SLP Education - 07/17/17 1355    Education provided  Yes    Education Details  use strategies     Person(s) Educated  Patient    Methods  Explanation    Comprehension  Verbalized understanding         SLP Long Term Goals - 06/12/17 1037  SLP LONG TERM GOAL #1   Title  Pt will complete high level word finding tasks with 80% acc.    Baseline  0%     Time  8    Period  Weeks    Status  New      SLP LONG TERM GOAL #2   Title  Pt will complete attention, executive function skills and memory strategy activities with 80% acc.     Baseline  0%    Time  8    Period  Weeks    Status  New      SLP LONG TERM GOAL #3   Title  Pt will utilize word finding strategies in conversation to aid with initiation and decrease hesitations and interjections with 80% acc.     Baseline  0 use of strategies    Time  8    Period  Weeks    Status  New       Plan - 07/17/17 1356    Clinical Impression Statement  Patient is anxious about her abilities to complete tasks, and was still hesitant and unsure of herself. Mentioned being afraid of progression of dementia. Mild confusion noted at times. Discussed purpose of ST is to delay progression and to develop  strategies to use.      Speech Therapy Frequency  2x / week    Duration  Other (comment)    Treatment/Interventions  Language facilitation;SLP instruction and feedback;Compensatory techniques;Compensatory strategies;Patient/family education;Functional tasks    Potential Considerations  Previous level of function;Family/community support    SLP Home Exercise Plan  practice strategies at home     Consulted and Agree with Plan of Care  Patient       Patient will benefit from skilled therapeutic intervention in order to improve the following deficits and impairments:   Cognitive communication deficit    Problem List Patient Active Problem List   Diagnosis Date Noted  . Aortic atherosclerosis ([place]) 01/03/2017  . Renal calculi 01/03/2017  . Allergic rhinitis 07/07/2015  . MCI (mild cognitive impairment) with memory loss 03/25/2015  . Depression, major, recurrent, mild ([place]) 03/25/2015  . Generalized anxiety disorder 03/25/2015  . IBS (irritable bowel syndrome) 02/15/2015  . At risk for falling 01/08/2015  . Body mass index (BMI) of 23.0-23.9 in adult 01/08/2015  . H/O malignant neoplasm of breast 01/08/2015  . Chronic constipation 01/08/2015  . Breast cancer of upper-outer quadrant of left female breast ([place]) 01/08/2015  . HZV (herpes zoster virus) post herpetic neuralgia 01/08/2015  . Apnea, sleep 01/08/2015  . Palpitations 08/21/2013  . Benign essential HTN 01/02/2012  . Beat, premature ventricular 04/21/2009  . Absolute anemia 02/05/2009  . Acid reflux 02/05/2009  . Essential (primary) hypertension 02/05/2009  . Bone/cartilage disorder 07/22/2007  . Avitaminosis D 10/08/2006  . Carotid artery obstruction 09/11/1998    Brandi Hanson [place] 07/17/2017, 1:56 PM  [place] MAIN [facility] SERVICES [address] [place], [place], [address] Phone: [phone]   Fax:  [phone]   Name: Brandi Hanson MRN: [id number] Date of Birth: Feb 13, 1936

## 2017-07-18 ENCOUNTER — Telehealth: Payer: Self-pay | Admitting: Family Medicine

## 2017-07-18 NOTE — Telephone Encounter (Signed)
Nicolette with [facility] is requesting a verbal order for Home health skill nursing.  1 time a week for 6 weeks and 1 PRN visit.  Also request PT, OT and speech evaluation.  HF#[phone]

## 2017-07-18 NOTE — Telephone Encounter (Signed)
Please review-Brandi Hanson V Ednah Hammock, RMA  

## 2017-07-19 NOTE — Telephone Encounter (Signed)
ok 

## 2017-07-19 NOTE — Telephone Encounter (Signed)
Brandi Hanson informed via voicemail

## 2017-07-23 ENCOUNTER — Telehealth: Payer: Self-pay | Admitting: Family Medicine

## 2017-07-23 NOTE — Telephone Encounter (Signed)
Please advise. Thanks.  

## 2017-07-23 NOTE — Telephone Encounter (Signed)
Minda with [facility] request a verbal order for speech therapy.  CB#[phone]/MW

## 2017-07-24 ENCOUNTER — Ambulatory Visit: Payer: Medicare Other | Admitting: Speech Pathology

## 2017-07-25 ENCOUNTER — Telehealth: Payer: Self-pay | Admitting: Family Medicine

## 2017-07-25 NOTE — Telephone Encounter (Signed)
Please review-Anastasiya V [place], RMA  

## 2017-07-25 NOTE — Telephone Encounter (Signed)
ok 

## 2017-07-25 NOTE — Telephone Encounter (Signed)
Nicolette with [facility] is requesting verbal orders for in home physical therapy as follows:  Twice a week for 3 weeks.  Please advise. Thanks TNP

## 2017-07-25 NOTE — Telephone Encounter (Signed)
Nicolette advised-Anastasiya Estell Harpin, RMA

## 2017-07-27 ENCOUNTER — Ambulatory Visit: Payer: Medicare Other | Admitting: Speech Pathology

## 2017-07-31 ENCOUNTER — Ambulatory Visit: Payer: Medicare Other | Admitting: Speech Pathology

## 2017-08-07 ENCOUNTER — Ambulatory Visit: Payer: Medicare Other | Admitting: Speech Pathology

## 2017-08-08 ENCOUNTER — Telehealth: Payer: Self-pay | Admitting: Family Medicine

## 2017-08-08 NOTE — Telephone Encounter (Signed)
Approved. Can you call them back?  Virginia Crews, MD, MPH [facility] 08/08/2017 3:28 PM

## 2017-08-08 NOTE — Telephone Encounter (Signed)
Tillie Rung advised as below on her [place], RMA

## 2017-08-08 NOTE — Telephone Encounter (Signed)
Brandi Hanson with [facility] is requesting verbal orders for physical therapy as follows:  Twice a week for 2 weeks and once a week for 2 weeks. This is to work with pt on balance and getting in and out of bed. Please advise. Thanks TNP

## 2017-08-08 NOTE — Telephone Encounter (Signed)
Can you please review for Dr Rosario Jacks, RMA

## 2017-08-21 ENCOUNTER — Telehealth: Payer: Self-pay | Admitting: Family Medicine

## 2017-08-21 NOTE — Telephone Encounter (Signed)
Speech therapy was cancelled today because of the weather.

## 2017-08-23 ENCOUNTER — Other Ambulatory Visit: Payer: Self-pay | Admitting: Neurology

## 2017-08-23 DIAGNOSIS — F0390 Unspecified dementia without behavioral disturbance: Secondary | ICD-10-CM

## 2017-08-27 ENCOUNTER — Other Ambulatory Visit: Payer: Self-pay | Admitting: Neurology

## 2017-08-27 DIAGNOSIS — F0390 Unspecified dementia without behavioral disturbance: Secondary | ICD-10-CM

## 2017-08-28 ENCOUNTER — Other Ambulatory Visit: Payer: Self-pay | Admitting: Neurology

## 2017-08-28 DIAGNOSIS — F0390 Unspecified dementia without behavioral disturbance: Secondary | ICD-10-CM

## 2017-08-29 ENCOUNTER — Other Ambulatory Visit: Payer: Self-pay

## 2017-08-29 MED ORDER — MEMANTINE HCL 10 MG PO TABS: 10.0000 mg | ORAL_TABLET | Freq: Two times a day (BID) | ORAL | 3 refills | Status: DC

## 2017-08-31 ENCOUNTER — Telehealth: Payer: Self-pay | Admitting: Family Medicine

## 2017-08-31 NOTE — Telephone Encounter (Signed)
Please review-Brandi Hanson, RMA  

## 2017-08-31 NOTE — Telephone Encounter (Signed)
Minda with [facility] called wanting to recert pt Dec 30 and need 6 more therapy sessions ordered.  Call back is [phone]  Thanks [name]

## 2017-09-06 NOTE — Telephone Encounter (Signed)
Advised. Via VM

## 2017-09-06 NOTE — Telephone Encounter (Signed)
ok 

## 2017-09-20 ENCOUNTER — Telehealth: Payer: Self-pay | Admitting: Family Medicine

## 2017-09-20 NOTE — Telephone Encounter (Signed)
Please advise. Patient taking 50 mg per our [place], RMA

## 2017-09-20 NOTE — Telephone Encounter (Signed)
Yes-- then RTC 1-2 months

## 2017-09-20 NOTE — Telephone Encounter (Signed)
Minda with [facility] home health called and states that patient's anxiety seems to be increasing and when her anxiety increases her confusion increases.  She states that the patient and her family asked her if she would call to see if you could increase the patient's Zoloft to maybe help with this.

## 2017-09-21 MED ORDER — SERTRALINE HCL 100 MG PO TABS: 100.0000 mg | ORAL_TABLET | Freq: Every day | ORAL | 12 refills | Status: DC

## 2017-09-21 NOTE — Telephone Encounter (Signed)
Minda advised, RX sent in for 100 mg. And patient already has appointment in 2 months in [place], [place]

## 2017-10-03 ENCOUNTER — Telehealth: Payer: Self-pay | Admitting: Neurology

## 2017-10-03 NOTE — Telephone Encounter (Signed)
Left vm for [name] pts sister on dpr to call back about clarification on aricept dosage and frequency.

## 2017-10-03 NOTE — Telephone Encounter (Signed)
Rn spoke with faye pts sister on dpr. [name] stated pt is taking aricept twice a day at 10mg . Rn stated per Dr. Leonie Man last note she is to take aricept 10mg  daily, and namenda 10mg  twice a day. [name] stated they were out at [place] now. Rn advised [name] to call back once she is at pts house. Rn recommend she get the actual pill bottle and read the directions and dosage. [name] stated she will call back once she is at pts house.

## 2017-10-03 NOTE — Telephone Encounter (Signed)
Rn call [name] about her sisters medication. [name] stated the pill bottle does say namenda 10mg  twice a day, pt takes one in the morning, and one at bedtime. She also stated pt has a 5mg  bottle of aricept. Rn stated that's a old prescription from 2017. Rn stated last office visit states aricept 5mg  at bedtime. [name] stated they went to the pharmacy,and got refills. Rn stated again that pt is to take: namenda 10mg  twice a  Day. aricept 10mg  at bedtime. [name] read back the pills and verbalized the pts frequency and dosage. [name] stated the pt is second guessing herself at times, and very anxious. She is also teary eye a lot. [name] stated her PCP increase her zoloft to 100mg  daily.[name] will continue to monitor her sisters pill management of aricept,and namenda.

## 2017-10-03 NOTE — Telephone Encounter (Signed)
Pts sister Faye(on [place]) is wanting clarification for donepezil (ARICEPT) 10 MG tablet. Pt seems to think she is suppose to take 2 a day but sister is thinking 1 at bedtime. I told [name] that I saw 1 a day at bedtime but is wanting to run it by RN for certainty. [name] will be leaving home soon but said to just leave a VM

## 2017-10-03 NOTE — Telephone Encounter (Signed)
Brandi Hanson is returning RNs call

## 2017-10-24 ENCOUNTER — Other Ambulatory Visit: Payer: Self-pay | Admitting: Cardiovascular Disease

## 2017-10-24 DIAGNOSIS — R0781 Pleurodynia: Secondary | ICD-10-CM

## 2017-10-25 ENCOUNTER — Other Ambulatory Visit: Payer: Self-pay

## 2017-10-25 DIAGNOSIS — R0781 Pleurodynia: Secondary | ICD-10-CM

## 2017-10-25 MED ORDER — LISINOPRIL 5 MG PO TABS: 5.0000 mg | ORAL_TABLET | Freq: Every day | ORAL | 3 refills | Status: DC

## 2017-11-20 ENCOUNTER — Encounter: Payer: Self-pay | Admitting: Neurology

## 2017-11-20 ENCOUNTER — Ambulatory Visit: Payer: Medicare Other | Admitting: Neurology

## 2017-11-20 VITALS — BP 101/53 | HR 69 | Wt 131.4 lb

## 2017-11-20 DIAGNOSIS — F0280 Dementia in other diseases classified elsewhere without behavioral disturbance: Secondary | ICD-10-CM

## 2017-11-20 DIAGNOSIS — G301 Alzheimer's disease with late onset: Secondary | ICD-10-CM

## 2017-11-20 NOTE — Patient Instructions (Signed)
I had a long discussion with the patient, son and sister regarding her Alzheimer's dementia with recent cognitive worsening. We discussed available treatment options including increasing the dose of Aricept and Namenda which may come at the cost of increased side effects. The patient and family do not want to do this at the present time and would like to continue to participate in [place] dementia study if she qualifies. I recommend we check urine analysis, CBC, EEG and CMP to look for reversible etiologies. Continue Aricept and Namenda in the current dosages. She will return for follow-up in 3 months or call earlier if necessary.

## 2017-11-20 NOTE — Progress Notes (Signed)
[facility] [address] [place]. [place] [address] [phone]       OFFICE FOLLOW UP NOTE  Ms. Lidia Clavijo [facility] Date of Birth:  1935/12/01 Medical Record Number:  [id number]   Referring MD:  Gaspar Garbe  Reason for Referral:  Memory loss  HPI: 89 year Caucasian lady  having mild progressive memory and cognitive difficulties for last 2 years which have been mildly progressive.She forgets  recent  Events, tasks and at times struggles to find words and finish sentences stopping in midsentence or occcasionally even stuttering..She misplaces things.at times she has been noticed as staring blankly into space and transiently unaware of her surroundings.She has longstanding h/o anxiety and has been on Librax 2.5 mg twice daily  For years and Inderal LA 60 mg has been added recently.She is yet independent in her ADLs and lives alone. There is no h/o headache, seizure, TIA, stroke, significant head injury with loss of consciousness.She has not had any brain imaging or lab work for causes of memory loss or detailed neuropsych testing. Update 08/12/13 : She returns for followup today and states that she did not start taking the fish oil as I had instructed and took only  the samples of Cerefolin I gave her and when she ran out she did not fill the prescription. She states her memory difficulties the about the same and anxiety seems to be increased. She did undergo the tests which I had ordered however. Vitamin B12, TSH and RPR on 06/09/13 were all normal. EEG done and 06/19/13 was normal without any epileptiform features. MRI scan of the brain on 06/20/13 showed mild changes of chronic microvascular ischemia and generalized cerebral atrophy. She admits that she is quite anxious and she has been taking Lipper ask and Inderal for a long time and has seen only her primary physician and perhaps may benefit by referral to a psychiatrist. She did not keep the appointment which we had made  [facility] as she prefers Dr Valentina Shaggy in [place] which is shorter drive for her. Update 02/04/2015 : She returns for follow-up today after last visit in December 2014. She is accompanied by sister. She continues to have cognitive difficulties mainly with short-term memory, multitasking, concentration. These subjectively seems to be worse but on objective testing today she actually did not score significantly less than last visit. She however did undergo detailed neuropsych testing on 10/22/14 by Dr. 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 Compared with similar testing by her in 2015 there appeared to be slight worsening. She was started on Aricept 5 mg daily 3 months ago by her primary physician she is tolerating it well without any GI or CNS side effects but the patient feels she is not subjectively any better. She was found to have difficulty with complex visual processing and advised to not drive or limit her driving. The patient informs me that she's not had any problems with driving and on my neurological exam she has full fields of vision and no major neurological deficits. She saw a psychiatrist in [place] but was not happy with him as he did not change her medications. Update 04/15/2015 : She returns for follow-up after last visit 2 months ago a complaint by sister and nephew. She continues to have memory difficulties as well as periods of transient confusion and disorientation off and on. She has now been on Aricept 10 mg for  the last 2 months and seems to be tolerating it reasonably well without significant GI side effects or dizziness. She has been driving without major problems but restricted to short distances. She does have difficulty with remembering directions. She has not had any delusions, hallucinations, falls, balance problems or safety concerns. On Mini-Mental status exam  today she scored 25 which is actually a drop from 29 from last visit. Update 06/23/15 : She returns for follow-up to last visit 2 months ago. She is a compared by her daughter and son. They have noticed some improvement in her memory and confusion. He was not able to tolerate Namenda when she increase the dose to 10 mg twice daily and hence went down to 5 mg twice daily on 05/18/15 which is tolerating much better. She in fact scored 29/30 on the Mini-Mental which is improved from 25/30 at last visit. She still has some good days and bad days. But she is more interactive and can produce patent conversations better. She denies significant nausea, vomiting, diarrhea or dizziness. There have been no major safety of issues. She has some mild impaired balance but has not had major injury or fall. She does not have significant delusions or hallucinations but does occasionally get agitated but can be redirected. She continues to live alone and her daughter lives across the street and has close supervision. She is driving to a limited extent and there have been no accidents. Update 01/21/2016 : She returns for follow-up after last visit 6 months ago. She is accompanied by her daughter and grandson. She continues to do resumed it well. She still has problems with numbers and names and calculations. She still lives alone. Family provides some supervision but mostly she is independent. She has own social circles and does go out with large groups. She does also go to the [facility] 3 days a week for exercise. She remains a bit and likes to do word gambles but does not do another cognitively challenging activities. She walks pretty good and there have been no safety concerns identified. She's not had any recent falls. She has not had any delusions or hallucinations. She does have significant anxiety and does get upset from time to time. She has a prescription for Librax to be taken 3 times a day but she usually takes it not more than  twice. The daughter feels that by late afternoon she is very anxious and may benefit by taking an additional dose at lunchtime She still driving very limited mostly during daytime to family or crowds. Update 07/31/2016 : She returns for follow-up after last visit 6 months ago. She is accompanied by her sister and nephew. Patient continues to have mild cognitive difficulties with short-term memory, forgetting names. At times unable to complete sentences. She also has anxiety. The family feels at times when several people are talking she has trouble focusing and paying attention and can zone out. She gets quite frustrated when there is conversation in a crowd. She still has trouble remembering dates and appointments. Family feels she may also be losing some interest in visiting places and going out of the house. She still continues to drive and has gotten lost a couple of occasions. The sister makes every attempt to be with the patient when she drives. The patient has recently gotten new primary care physician. On Mini-Mental status exam today she scored 30/30 which is unchanged from last visit. She remains on Aricept and Namenda which is tolerating well without side effects. Update  05/21/2017 : She returns for follow-up after last visit 3 months ago. She is accompanied by sister and son. Patient had some initial trouble tolerating the increased dose of Namenda 10 mg twice daily with some hallucinations but she's done better now in last few months. She however cognitively is about the same. She is having some word finding difficulties and does her physical errors and uses wrong word substitution. She has had not any major issues with agitation not unsafe behavior of falls. She does get some hallucinations at times. The patient's family has now decided to consider participation in the [place] dementia trial. Update 11/20/2017 : Patient is seen for follow-up after last visit 6 months ago. She is accompanied by her  son and sister who states that they have noticed cognitive worsening since Christmastime. Patient gets confused easily. She is often disoriented. She needs more help, sister who lives right across. Sister helps her with medications. Patient has difficult time answering phones correctly for telling time. She is stop cocaine. She often last same questions repeatedly. There have been no changes in her behavior, agitation, delusions or hallucinations noted. Patient has decided to participate in the [place] trial. She is currently on Aricept 10 mg daily and Namenda 10 mg twice daily. She has been reluctant to increase the dose of the medications due to possibility of side effects. ROS:   14 system review of systems is positive for  hearing loss, ringing in the ears, runny nose, light sensitivity, cough, leg swelling, palpitations, abdominal pain, constipation, diarrhea, restless leg, apnea, daytime sleepiness, joint pain, back pain, walking difficulty, skin moles, memory loss, headache, speech difficulty, tremors, agitation, behavioral problem, decreased concentration, depression, nervousness, anxiety, hallucinations and all other systems negative and all other systems negative   PMH:  Past Medical History:  Diagnosis Date  . Abdominal distention   . Abdominal pain   . Anxiety   . Arthritis   . Benign essential HTN 01/02/2012  . Breast cancer in situ 07/22/2011  . Cancer ([place])    skin  . Confusion   . Depression   . Generalized headaches   . GERD (gastroesophageal reflux disease)   . Hyperlipidemia   . Hyperlipidemia 01/02/2012  . Hypertension   . Memory loss   . Osteoporosis   . Palpitations   . PVC (premature ventricular contraction)   . Rectal pain   . Sleep apnea     Social History:  Social History   Socioeconomic History  . Marital status: Single    Spouse name: Not on file  . Number of children: Not on file  . Years of education: Not on file  . Highest education level: Not on  file  Social Needs  . Financial resource strain: Not on file  . Food insecurity - worry: Not on file  . Food insecurity - inability: Not on file  . Transportation needs - medical: Not on file  . Transportation needs - non-medical: Not on file  Occupational History  . Not on file  Tobacco Use  . Smoking status: Never Smoker  . Smokeless tobacco: Never Used  Substance and Sexual Activity  . Alcohol use: No  . Drug use: No  . Sexual activity: No  Other Topics Concern  . Not on file  Social History Narrative  . Not on file    Medications:   Current Outpatient Medications on File Prior to Visit  Medication Sig Dispense Refill  . acetaminophen (TYLENOL) 500 MG tablet Take 500 mg by  mouth every 6 (six) hours as needed.    Marland Kitchen alendronate (FOSAMAX) 70 MG tablet TAKE 1 TABLET BY MOUTH EVERY 7 DAYS. TAKE WITH A FULL GLASS OF WATER ON AN EMPTY STOMACH 4 tablet 12  . aspirin 81 MG tablet ASPIRIN EC LOW DOSE, 81MG  (Oral Tablet Delayed Release)  1 Every Day for 0 days  Quantity: 0.00;  Refills: 0   Ordered :12-May-2010  Abran Richard ;  Started 05-Feb-2009 Active Comments: DX: 272.0    . calcium carbonate (TUMS - DOSED IN MG ELEMENTAL CALCIUM) 500 MG chewable tablet Chew 1 tablet by mouth. PRN    . Cholecalciferol (VITAMIN D3) 1000 UNITS CAPS Take 2 capsules by mouth daily.    . clidinium-chlordiazePOXIDE (LIBRAX) 5-2.5 MG capsule TAKE 1 OR 2 CAPSULES BY MOUTH 3 TIMES A DAY AS NEEDED 540 capsule 1  . Coenzyme Q10 (CO Q10) 100 MG CAPS Take by mouth daily.    Marland Kitchen donepezil (ARICEPT) 10 MG tablet TAKE 1 TABLET BY MOUTH AT BEDTIME 90 tablet 3  . lisinopril (PRINIVIL,ZESTRIL) 5 MG tablet Take 1 tablet (5 mg total) by mouth daily. 90 tablet 3  . loratadine (CLARITIN) 10 MG tablet Take 1 tablet (10 mg total) by mouth daily. 30 tablet 11  . memantine (NAMENDA) 10 MG tablet Take 1 tablet (10 mg total) by mouth 2 (two) times daily. 180 tablet 3  . mometasone (ELOCON) 0.1 % lotion     . potassium citrate  (UROCIT-K) 10 MEQ (1080 MG) SR tablet Take 10 mEq by mouth. BID    . propranolol (INDERAL) 20 MG tablet TAKE 2 TABLETS BY MOUTH 2 TIMES DAILY 360 tablet 4  . sertraline (ZOLOFT) 100 MG tablet Take 1 tablet (100 mg total) by mouth daily. 30 tablet 12   No current facility-administered medications on file prior to visit.     Allergies:   Allergies  Allergen Reactions  . Sulfa Antibiotics Nausea Only    Physical Exam General: frail elderly Caucasian lady, seated, in no evident distress Head: head normocephalic and atraumatic.   Neck: supple with no carotid or supraclavicular bruits Cardiovascular: regular rate and rhythm, no murmurs Musculoskeletal: no deformity Skin:  no rash/petichiae Vascular:  Normal pulses all extremities Vitals:   11/20/17 1122  BP: (!) 101/53  Pulse: 69    Neurologic Exam Mental Status: Awake and fully alert. Oriented to place and time. Recent and remote memory intact. Attention span, concentration and fund of knowledge appropriate. Mood and affect appropriate. MMSE 20/30 with deficits in orientation, attention and calculation. Able to name 7 animals with full legs.. Clock drawing 3/4.  Cranial Nerves: Fundoscopic exam not done.  Pupils equal, briskly reactive to light. Extraocular movements full without nystagmus. Visual fields full to confrontation. Hearing intact. Facial sensation intact. Face, tongue, palate moves normally and symmetrically.  Motor: Normal bulk and tone. Normal strength in all tested extremity muscles. Sensory.: intact to tough and pinprick and vibratory.  Coordination: Rapid alternating movements normal in all extremities. Finger-to-nose and heel-to-shin performed accurately bilaterally. Gait and Station: Arises from chair without difficulty. Stance is normal. Gait demonstrates normal stride length and balance . Able to heel, toe and tandem walk without difficulty.  Reflexes: 1+ and symmetric. Toes downgoing.      ASSESSMENT: 78 year  lady with progressive mild memory difficulties  X 3 years-likely  Mild Alzheimer`s dementia due to  significant progression over last 2 years and some response to Aricept and Namenda    PLAN: I had a  long discussion with the patient, son and sister regarding her Alzheimer's dementia with recent cognitive worsening. We discussed available treatment options including increasing the dose of Aricept and Namenda which may come at the cost of increased side effects. The patient and family do not want to do this at the present time and would like to continue to participate in [place] dementia study if she qualifies. I recommend we check urine analysis, CBC, EEG and CMP to look for reversible etiologies. Continue Aricept and Namenda in the current dosages. She will return for follow-up in 3 months or call earlier if necessary. Greater than 50% time during this 25 minute visit was spent on counseling and coordination of care about her dementia.   Antony Contras, MD

## 2017-11-21 ENCOUNTER — Encounter: Payer: Self-pay | Admitting: Family Medicine

## 2017-11-21 ENCOUNTER — Telehealth: Payer: Self-pay | Admitting: Neurology

## 2017-11-21 NOTE — Telephone Encounter (Signed)
Patient's nephew calling back with fax number for [facility]- [phone]

## 2017-11-21 NOTE — Telephone Encounter (Signed)
Pt's nephew called he is requesting lab orders to be faxed to [facility] on [address].

## 2017-11-21 NOTE — Telephone Encounter (Signed)
Orders fax per patients nephew to [facility] in [place].All three orders signed,and fax and confirmed.

## 2017-11-22 ENCOUNTER — Other Ambulatory Visit: Payer: Self-pay | Admitting: Neurology

## 2017-11-22 NOTE — Telephone Encounter (Signed)
[facility] called requesting orders to be faxed to them as soon as possible due to the pt being in office. Once stating RN faxed them more than once as of 3/13 she apologized and realized she had already received the orders.

## 2017-11-23 LAB — COMPREHENSIVE METABOLIC PANEL
A/G RATIO: 2.3 — AB (ref 1.2–2.2)
ALBUMIN: 4.1 g/dL (ref 3.5–4.8)
ALT: 13 IU/L (ref 0–32)
AST: 19 IU/L (ref 0–40)
Alkaline Phosphatase: 73 IU/L (ref 39–117)
BUN / CREAT RATIO: 25 (ref 12–28)
BUN: 17 mg/dL (ref 8–27)
Bilirubin Total: 0.3 mg/dL (ref 0.0–1.2)
CO2: 29 mmol/L (ref 20–29)
Calcium: 9.2 mg/dL (ref 8.7–10.3)
Chloride: 104 mmol/L (ref 96–106)
Creatinine, Ser: 0.68 mg/dL (ref 0.57–1.00)
GFR, EST AFRICAN AMERICAN: 96 mL/min/{1.73_m2} (ref >59)
GFR, EST NON AFRICAN AMERICAN: 83 mL/min/{1.73_m2} (ref >59)
GLOBULIN, TOTAL: 1.8 g/dL (ref 1.5–4.5)
Glucose: 93 mg/dL (ref 65–99)
POTASSIUM: 4.2 mmol/L (ref 3.5–5.2)
SODIUM: 144 mmol/L (ref 134–144)
Total Protein: 5.9 g/dL — ABNORMAL LOW (ref 6.0–8.5)

## 2017-11-23 LAB — URINALYSIS, ROUTINE W REFLEX MICROSCOPIC
BILIRUBIN UA: NEGATIVE
Glucose, UA: NEGATIVE
Ketones, UA: NEGATIVE
LEUKOCYTES UA: NEGATIVE
Nitrite, UA: NEGATIVE
PH UA: 7 (ref 5.0–7.5)
PROTEIN UA: NEGATIVE
RBC, UA: NEGATIVE
Specific Gravity, UA: 1.015 (ref 1.005–1.030)
Urobilinogen, Ur: 0.2 mg/dL (ref 0.2–1.0)

## 2017-11-23 LAB — CBC
HEMATOCRIT: 33.8 % — AB (ref 34.0–46.6)
HEMOGLOBIN: 11.4 g/dL (ref 11.1–15.9)
MCH: 29.3 pg (ref 26.6–33.0)
MCHC: 33.7 g/dL (ref 31.5–35.7)
MCV: 87 fL (ref 79–97)
Platelets: 206 10*3/uL (ref 150–379)
RBC: 3.89 x10E6/uL (ref 3.77–5.28)
RDW: 13.5 % (ref 12.3–15.4)
WBC: 5 10*3/uL (ref 3.4–10.8)

## 2017-11-27 ENCOUNTER — Other Ambulatory Visit: Payer: Self-pay | Admitting: Neurology

## 2017-11-27 DIAGNOSIS — G309 Alzheimer's disease, unspecified: Secondary | ICD-10-CM

## 2017-11-28 ENCOUNTER — Ambulatory Visit (INDEPENDENT_AMBULATORY_CARE_PROVIDER_SITE_OTHER): Payer: Medicare Other | Admitting: Family Medicine

## 2017-11-28 ENCOUNTER — Telehealth: Payer: Self-pay | Admitting: Adult Health

## 2017-11-28 VITALS — BP 128/80 | HR 60 | Temp 97.7°F | Resp 16

## 2017-11-28 DIAGNOSIS — I10 Essential (primary) hypertension: Secondary | ICD-10-CM

## 2017-11-28 DIAGNOSIS — F0280 Dementia in other diseases classified elsewhere without behavioral disturbance: Secondary | ICD-10-CM

## 2017-11-28 DIAGNOSIS — Z853 Personal history of malignant neoplasm of breast: Secondary | ICD-10-CM

## 2017-11-28 DIAGNOSIS — F0390 Unspecified dementia without behavioral disturbance: Secondary | ICD-10-CM

## 2017-11-28 DIAGNOSIS — E7800 Pure hypercholesterolemia, unspecified: Secondary | ICD-10-CM

## 2017-11-28 DIAGNOSIS — G4737 Central sleep apnea in conditions classified elsewhere: Secondary | ICD-10-CM

## 2017-11-28 DIAGNOSIS — G309 Alzheimer's disease, unspecified: Secondary | ICD-10-CM

## 2017-11-28 NOTE — Telephone Encounter (Signed)
Patients sister called to cancel appointment with [place]. Will call to r/s .

## 2017-11-28 NOTE — Progress Notes (Signed)
Brandi Hanson  MRN: [id number] DOB: 1936/02/02  Subjective:  HPI   The patient presents for what was supposed to be her wellness exam.  However, her sister has several concerns that need to be addressed.  The patient scored a 13 on her PHQ9.  She is currently on Sertraline.  Her sisters says she is sleeping and doing things much more slowly than normal.  She does have some MCI/dementia and it is unclear if some of her actions are from progression of the dementia.  The patient is still living alone with her sister living across the street and going back and forth between the two homes.  Her sister is concerned that she needs someone to be with her more.  They would like to see if she can come off of any of her medications, specifically some of the vitamins.    She has been getting blood pressure readings that are running a little low.  She reports them being 100's/50's.  She is on Lisinopril and Propranolol.   The patient has osteoarthritis and is currently taking Tylenol for pain.  She has been told by other doctors that this is the safest thing for her to take but she is afraid of taking too much and would like to know what the maximum amount would be for her.   They would like to get information on sitters, medical alert buttons and possible assisted living places.  I have told them that I will reach out to the nurse care guide for more information.    The patient had labs through her neurology office and would like for you to review them today if you have acces to them.   Patient Active Problem List   Diagnosis Date Noted  . Aortic atherosclerosis ([place]) 01/03/2017  . Renal calculi 01/03/2017  . Allergic rhinitis 07/07/2015  . MCI (mild cognitive impairment) with memory loss 03/25/2015  . Depression, major, recurrent, mild ([place]) 03/25/2015  . Generalized anxiety disorder 03/25/2015  . IBS (irritable bowel syndrome) 02/15/2015  . At risk for falling 01/08/2015  . Body mass index  (BMI) of 23.0-23.9 in adult 01/08/2015  . H/O malignant neoplasm of breast 01/08/2015  . Chronic constipation 01/08/2015  . Breast cancer of upper-outer quadrant of left female breast ([place]) 01/08/2015  . HZV (herpes zoster virus) post herpetic neuralgia 01/08/2015  . Apnea, sleep 01/08/2015  . Palpitations 08/21/2013  . Benign essential HTN 01/02/2012  . Beat, premature ventricular 04/21/2009  . Absolute anemia 02/05/2009  . Acid reflux 02/05/2009  . Essential (primary) hypertension 02/05/2009  . Bone/cartilage disorder 07/22/2007  . Avitaminosis D 10/08/2006  . Carotid artery obstruction 09/11/1998    Past Medical History:  Diagnosis Date  . Abdominal distention   . Abdominal pain   . Anxiety   . Arthritis   . Benign essential HTN 01/02/2012  . Breast cancer in situ 07/22/2011  . Cancer ([place])    skin  . Confusion   . Depression   . Generalized headaches   . GERD (gastroesophageal reflux disease)   . Hyperlipidemia   . Hyperlipidemia 01/02/2012  . Hypertension   . Memory loss   . Osteoporosis   . Palpitations   . PVC (premature ventricular contraction)   . Rectal pain   . Sleep apnea     Social History   Socioeconomic History  . Marital status: Single    Spouse name: Not on file  . Number of children: Not on file  . Years  of education: Not on file  . Highest education level: Not on file  Social Needs  . Financial resource strain: Not on file  . Food insecurity - worry: Not on file  . Food insecurity - inability: Not on file  . Transportation needs - medical: Not on file  . Transportation needs - non-medical: Not on file  Occupational History  . Not on file  Tobacco Use  . Smoking status: Never Smoker  . Smokeless tobacco: Never Used  Substance and Sexual Activity  . Alcohol use: No  . Drug use: No  . Sexual activity: No  Other Topics Concern  . Not on file  Social History Narrative  . Not on file    Outpatient Encounter Medications as of 11/28/2017    Medication Sig Note  . acetaminophen (TYLENOL) 500 MG tablet Take 500 mg by mouth every 6 (six) hours as needed.   Marland Kitchen alendronate (FOSAMAX) 70 MG tablet TAKE 1 TABLET BY MOUTH EVERY 7 DAYS. TAKE WITH A FULL GLASS OF WATER ON AN EMPTY STOMACH   . aspirin 81 MG tablet ASPIRIN EC LOW DOSE, 81MG  (Oral Tablet Delayed Release)  1 Every Day for 0 days  Quantity: 0.00;  Refills: 0   Ordered :12-May-2010  Abran Richard ;  Started 05-Feb-2009 Active Comments: DX: 272.0 01/08/2015: DX: 272.0 Received from: [facility]  . calcium carbonate (TUMS - DOSED IN MG ELEMENTAL CALCIUM) 500 MG chewable tablet Chew 1 tablet by mouth. PRN 01/08/2015: Received from: [facility]  . Cholecalciferol (VITAMIN D3) 1000 UNITS CAPS Take 2 capsules by mouth daily. 01/08/2015: Received from: [facility]  . clidinium-chlordiazePOXIDE (LIBRAX) 5-2.5 MG capsule TAKE 1 OR 2 CAPSULES BY MOUTH 3 TIMES A DAY AS NEEDED   . Coenzyme Q10 (CO Q10) 100 MG CAPS Take by mouth daily.   Marland Kitchen donepezil (ARICEPT) 10 MG tablet TAKE 1 TABLET BY MOUTH AT BEDTIME   . lisinopril (PRINIVIL,ZESTRIL) 5 MG tablet Take 1 tablet (5 mg total) by mouth daily.   Marland Kitchen loratadine (CLARITIN) 10 MG tablet Take 1 tablet (10 mg total) by mouth daily.   . memantine (NAMENDA) 10 MG tablet Take 1 tablet (10 mg total) by mouth 2 (two) times daily.   . mometasone (ELOCON) 0.1 % lotion    . potassium citrate (UROCIT-K) 10 MEQ (1080 MG) SR tablet Take 10 mEq by mouth. BID 01/08/2015: Received from: [facility]  . propranolol (INDERAL) 20 MG tablet TAKE 2 TABLETS BY MOUTH 2 TIMES DAILY   . sertraline (ZOLOFT) 100 MG tablet Take 1 tablet (100 mg total) by mouth daily.    No facility-administered encounter medications on file as of 11/28/2017.     Allergies  Allergen Reactions  . Sulfa Antibiotics Nausea Only    Review of Systems  Constitutional: Negative.        Crying and irritibility  HENT: Positive for  hearing loss and tinnitus.        Runny nose and post nasal drip  Eyes: Positive for photophobia.  Respiratory: Positive for cough.        Apnea  Cardiovascular: Positive for palpitations.  Gastrointestinal: Positive for abdominal pain, constipation and diarrhea.  Musculoskeletal: Positive for back pain, joint pain, myalgias and neck pain.       Gait problems  Neurological: Positive for tremors, speech change and headaches.  Endo/Heme/Allergies: Bruises/bleeds easily.  Psychiatric/Behavioral: Positive for hallucinations. The patient is nervous/anxious.        Agitation, confusion, decreased concentration,  dysphoria,  And sleep disturbance     Depression screen [facility] 2/9 11/28/2017 11/16/2016 07/11/2016 07/11/2016 07/11/2016  Decreased Interest 1 1 - 1 1  Down, Depressed, Hopeless 1 2 - 2 2  PHQ - 2 Score 2 3 - 3 3  Altered sleeping 2 2 - 1 -  Tired, decreased energy 1 0 - 1 -  Change in appetite 0 0 - 0 -  Feeling bad or failure about yourself  1 1 - 2 -  Trouble concentrating 3 3 - 3 -  Moving slowly or fidgety/restless 3 0 - 0 -  Suicidal thoughts 1 0 - 0 -  PHQ-9 Score 13 9 - 10 -  Difficult doing work/chores Somewhat difficult - Somewhat difficult - -  Some encounter information is confidential and restricted. Go to Review Flowsheets activity to see all data.      Objective:  BP 128/80 (BP Location: Right Arm, Patient Position: Sitting, Cuff Size: Normal)   Pulse 60   Temp 97.7 F (36.5 C) (Oral)   Resp 16   SpO2 98%   Physical Exam  Constitutional: She is oriented to person, place, and time and well-developed, well-nourished, and in no distress.  HENT:  Head: Normocephalic and atraumatic.  Right Ear: External ear normal.  Left Ear: External ear normal.  Nose: Nose normal.  Mouth/Throat: Oropharynx is clear and moist.  Eyes: Conjunctivae are normal. No scleral icterus.  Neck: No thyromegaly present.  Cardiovascular: Normal rate, regular rhythm and normal heart sounds.   Pulmonary/Chest: Effort normal.  Abdominal: Soft.  Neurological: She is alert and oriented to person, place, and time. Gait normal. GCS score is 15.  Skin: Skin is warm and dry.  Psychiatric: Mood, memory, affect and judgment normal.    Assessment and Plan :  1. Dementia without behavioral disturbance, unspecified dementia type May need placement in future.FL2 filled out. - Ambulatory referral to [facility]  2. Alzheimer's dementia without behavioral disturbance, unspecified timing of dementia onset  3.HTN 4.Anxiety 5.h/o Breast Cancer 6.GERD 7.OSA  I have done the exam and reviewed the chart and it is accurate to the best of my knowledge. Development worker, community has been used and  any errors in dictation or transcription are unintentional. Miguel Aschoff M.D. [facility]

## 2017-12-13 ENCOUNTER — Other Ambulatory Visit: Payer: Self-pay | Admitting: Cardiovascular Disease

## 2017-12-13 ENCOUNTER — Ambulatory Visit: Payer: Medicare Other

## 2017-12-13 DIAGNOSIS — F0280 Dementia in other diseases classified elsewhere without behavioral disturbance: Secondary | ICD-10-CM

## 2017-12-13 DIAGNOSIS — R410 Disorientation, unspecified: Secondary | ICD-10-CM

## 2017-12-13 DIAGNOSIS — G301 Alzheimer's disease with late onset: Principal | ICD-10-CM

## 2017-12-14 ENCOUNTER — Other Ambulatory Visit: Payer: Self-pay | Admitting: Family Medicine

## 2017-12-14 MED ORDER — PROPRANOLOL HCL 20 MG PO TABS: 40.0000 mg | ORAL_TABLET | Freq: Two times a day (BID) | ORAL | 4 refills | Status: DC

## 2017-12-14 NOTE — Telephone Encounter (Signed)
Patient's sister is requesting a refill on the following medication  propranolol (INDERAL) 20 MG tablet  She uses [employer]  She will be out of this medication after tomorrow.

## 2017-12-17 ENCOUNTER — Telehealth: Payer: Self-pay | Admitting: Family Medicine

## 2017-12-17 NOTE — Telephone Encounter (Signed)
Masha thinks her Inderal  is making her nauseous.   [name] (sister) wants to talk to Sheridan about this.

## 2017-12-17 NOTE — Telephone Encounter (Signed)
Brandi Hanson said that she thinks the patient is anxious when alone and that is when she is having the nausea.  It is not all the time. She denies any pain and has not vomiting.  Changes on the last visit were; increased Sertraline from 50-100 because of increased crying, irritability etc and we d/c'ed her Librax.  She is currently living alone with her sister across the street.  However, the sister is not with her all of the time and thinks she needs to be in assisted living or memory care.  Can home health do an assessment to see if she would meet criteria?    After we get orders from Dr Darnell Level we need to have the front office make changes to her demographics. Patient's primary number needs to be changed to her sister Faye's cell number.

## 2017-12-17 NOTE — Telephone Encounter (Signed)
LMTCB ED 

## 2017-12-19 ENCOUNTER — Telehealth: Payer: Self-pay | Admitting: Neurology

## 2017-12-19 NOTE — Telephone Encounter (Signed)
Gordy Clement [phone] is pts nephew contact number for call back.

## 2017-12-19 NOTE — Telephone Encounter (Signed)
Pts nephew requesting a call back to discuss the pts diginose and a few other details, Elta Guadeloupe didn't want to go into detail with me

## 2017-12-19 NOTE — Telephone Encounter (Signed)
Patients nephew was calling about his aunt having a scan at [facility] for the clinical trial study. He was told pt had some areas on the scan he wants to discuss,and the plan after the scan. Rn stated a message will be sent to Dr. Leonie Man. The nephew verbalized understanding,and wants a call on his cell phone.

## 2017-12-19 NOTE — Telephone Encounter (Signed)
Left vm for patients nephew Elta Guadeloupe on dpr to return phone call.

## 2017-12-19 NOTE — Telephone Encounter (Signed)
The nephew would like EEG and MRI results.Thanks

## 2017-12-20 ENCOUNTER — Telehealth: Payer: Self-pay

## 2017-12-20 NOTE — Telephone Encounter (Signed)
Ok thx.

## 2017-12-20 NOTE — Telephone Encounter (Signed)
-----   Message from Garvin Fila, MD sent at 12/19/2017 10:50 AM EDT ----- Brandi Hanson inform the patient that EEG study showed no definite evidence of seizure activity. It did show slight slowing of the brain wave activity on the left side which is a nonspecific finding. No worrisome finding

## 2017-12-20 NOTE — Telephone Encounter (Signed)
Pt's nephew is returning the providers call. He will keep his phone close and on vibrate but he is sales, maybe hard to catch.

## 2017-12-20 NOTE — Telephone Encounter (Signed)
I called and left a message on his answering machine to call me back to discuss the test results

## 2017-12-20 NOTE — Telephone Encounter (Signed)
Notes recorded by Marval Regal, RN on 12/20/2017 at 2:22 PM EDT Left vm for patients nephew to call back about eeg results. ------

## 2017-12-20 NOTE — Telephone Encounter (Signed)
I spoke to patient's nephew Elta Guadeloupe and explained results of PET scan showing patient has excessive tau accumulation which is beyond the permissible limits to enroll in the Ideal dementia study. The patient likely will not be able to live at her home without increasing supervision and help and family needs to make decisions about her living  situation soon.He  Acknowledged understanding

## 2017-12-20 NOTE — Telephone Encounter (Signed)
Notes recorded by Marval Regal, RN on 12/20/2017 at 4:07 PM EDT Per Dr. Leonie Man he spoke to nephew about test results.

## 2017-12-21 ENCOUNTER — Other Ambulatory Visit: Payer: Self-pay

## 2017-12-21 DIAGNOSIS — G3184 Mild cognitive impairment, so stated: Secondary | ICD-10-CM

## 2017-12-21 NOTE — Telephone Encounter (Signed)
Referral placed.

## 2017-12-24 ENCOUNTER — Encounter: Payer: Self-pay | Admitting: Family Medicine

## 2017-12-28 ENCOUNTER — Telehealth: Payer: Self-pay | Admitting: Family Medicine

## 2017-12-28 NOTE — Telephone Encounter (Signed)
Sandy with [facility] called wanting a verbal order to move pt's speech therapy eval to the week of 01/01/18  Sandys call back is [phone]  Thanks teri

## 2017-12-28 NOTE — Telephone Encounter (Signed)
Advised  ED 

## 2018-01-01 ENCOUNTER — Telehealth: Payer: Self-pay

## 2018-01-01 NOTE — Telephone Encounter (Signed)
Sandy with [facility] is requesting a verbal order for speech therapy for cognition for 1 time per week for 5 weeks. CB#[phone]

## 2018-01-02 NOTE — Telephone Encounter (Signed)
Advised  ED 

## 2018-01-07 ENCOUNTER — Encounter: Payer: Self-pay | Admitting: Family Medicine

## 2018-01-07 ENCOUNTER — Ambulatory Visit (INDEPENDENT_AMBULATORY_CARE_PROVIDER_SITE_OTHER): Payer: Medicare Other | Admitting: Family Medicine

## 2018-01-07 VITALS — BP 116/60 | HR 64 | Temp 98.5°F | Resp 16 | Wt 125.0 lb

## 2018-01-07 DIAGNOSIS — J3089 Other allergic rhinitis: Secondary | ICD-10-CM | POA: Diagnosis not present

## 2018-01-07 DIAGNOSIS — K582 Mixed irritable bowel syndrome: Secondary | ICD-10-CM

## 2018-01-07 DIAGNOSIS — F0390 Unspecified dementia without behavioral disturbance: Secondary | ICD-10-CM

## 2018-01-07 DIAGNOSIS — I10 Essential (primary) hypertension: Secondary | ICD-10-CM

## 2018-01-07 MED ORDER — PROPRANOLOL HCL 20 MG PO TABS: 20.0000 mg | ORAL_TABLET | Freq: Two times a day (BID) | ORAL | 4 refills | Status: AC

## 2018-01-07 NOTE — Progress Notes (Signed)
Patient: Brandi Hanson Female    DOB: 03/23/1936   82 y.o.   MRN: [id number] Visit Date: 01/07/2018  Today's Provider: Wilhemena Durie, MD   Chief Complaint  Patient presents with  . Follow-up   Subjective:    HPI Pt is here for a follow up. She reports that she has been feeling "yucky". She reports that she has started taking boost and has felt better. She is trying to gain weight. She has noticed that she feels "yucky" more when she is by herself and thinking.   Last OV several of her medications were stopped. Co Q 10, Lisinopril, Claritin, potassium and chlordiazepoxide-clidinium.   She is having problems with her allergies. She does not want to take something and feel sleepy.      Allergies  Allergen Reactions  . Sulfa Antibiotics Nausea Only     Current Outpatient Medications:  .  aspirin 81 MG tablet, ASPIRIN EC LOW DOSE, 81MG  (Oral Tablet Delayed Release)  1 Every Day for 0 days  Quantity: 0.00;  Refills: 0   Ordered :12-May-2010  Abran Richard ;  Started 05-Feb-2009 Active Comments: DX: 272.0, Disp: , Rfl:  .  Cholecalciferol (VITAMIN D3) 1000 UNITS CAPS, Take 2 capsules by mouth daily., Disp: , Rfl:  .  donepezil (ARICEPT) 10 MG tablet, TAKE 1 TABLET BY MOUTH AT BEDTIME, Disp: 90 tablet, Rfl: 3 .  memantine (NAMENDA) 10 MG tablet, Take 1 tablet (10 mg total) by mouth 2 (two) times daily., Disp: 180 tablet, Rfl: 3 .  propranolol (INDERAL) 20 MG tablet, Take 2 tablets (40 mg total) by mouth 2 (two) times daily., Disp: 360 tablet, Rfl: 4 .  sertraline (ZOLOFT) 100 MG tablet, Take 1 tablet (100 mg total) by mouth daily., Disp: 30 tablet, Rfl: 12 .  acetaminophen (TYLENOL) 500 MG tablet, Take 500 mg by mouth every 6 (six) hours as needed., Disp: , Rfl:  .  alendronate (FOSAMAX) 70 MG tablet, TAKE 1 TABLET BY MOUTH EVERY 7 DAYS. TAKE WITH A FULL GLASS OF WATER ON AN EMPTY STOMACH, Disp: 4 tablet, Rfl: 12 .  calcium carbonate (TUMS - DOSED IN MG ELEMENTAL CALCIUM) 500  MG chewable tablet, Chew 1 tablet by mouth. PRN, Disp: , Rfl:  .  mometasone (ELOCON) 0.1 % lotion, , Disp: , Rfl:   Review of Systems  Constitutional: Negative.   HENT: Positive for rhinorrhea and sneezing.   Eyes: Negative.   Respiratory: Negative.   Cardiovascular: Negative.   Gastrointestinal: Negative.   Endocrine: Negative.   Genitourinary: Negative.   Musculoskeletal: Negative.   Skin: Negative.   Allergic/Immunologic: Negative.   Neurological: Negative.   Hematological: Negative.   Psychiatric/Behavioral: Negative.        Obvious cognitive deficits but very pleasant.    Social History   Tobacco Use  . Smoking status: Never Smoker  . Smokeless tobacco: Never Used  Substance Use Topics  . Alcohol use: No   Objective:   BP 116/60 (BP Location: Left Arm, Patient Position: Sitting, Cuff Size: Normal)   Pulse 64   Temp 98.5 F (36.9 C) (Oral)   Resp 16   Wt 125 lb (56.7 kg)   BMI 19.58 kg/m  Vitals:   01/07/18 1441  BP: 116/60  Pulse: 64  Resp: 16  Temp: 98.5 F (36.9 C)  TempSrc: Oral  Weight: 125 lb (56.7 kg)     Physical Exam  Constitutional: She is oriented to person, place, and time.  She appears well-developed and well-nourished.  Eyes: Pupils are equal, round, and reactive to light. Conjunctivae and EOM are normal.  Neck: Normal range of motion. Neck supple.  Cardiovascular: Normal rate, regular rhythm, normal heart sounds and intact distal pulses.  Pulmonary/Chest: Effort normal and breath sounds normal.  Abdominal: Soft. Bowel sounds are normal.  Musculoskeletal: Normal range of motion.  Neurological: She is alert and oriented to person, place, and time.  Skin: Skin is warm and dry.  Psychiatric: She has a normal mood and affect.        Assessment & Plan:     Alzheimers Dementia Progressive.No behavioral issues.Overall will try to cut back on unneeded meds. More than 50% of 25 minute visit spent in counseling Allergic Rhinitis  Restart  Claritin Health Maintenance Stop ASA. IBS Try /refill Librax.  I have done the exam and reviewed the chart and it is accurate to the best of my knowledge. Development worker, community has been used and  any errors in dictation or transcription are unintentional. Miguel Aschoff M.D. [place], MD  [facility]

## 2018-01-07 NOTE — Patient Instructions (Addendum)
Stop Aspirin. Can restart Claritin and Librax (Chlordiazepoxide-Clidinium). Decrease Propanolol to 1 tablet twice daily.

## 2018-01-11 ENCOUNTER — Encounter: Payer: Medicare Other | Admitting: Adult Health

## 2018-01-16 ENCOUNTER — Telehealth: Payer: Self-pay | Admitting: Family Medicine

## 2018-01-16 NOTE — Telephone Encounter (Signed)
Brandi Hanson with [facility] is visiting with Mrs Brandi Hanson and she is having some uti symptoms and they want to know if they can get a UA order for her  Please advise  Verbal order  [name]

## 2018-01-16 NOTE — Telephone Encounter (Signed)
That's fine

## 2018-01-16 NOTE — Telephone Encounter (Signed)
Meredith advised.

## 2018-01-16 NOTE — Telephone Encounter (Signed)
Please advise 

## 2018-01-16 NOTE — Telephone Encounter (Signed)
Tried [employer] back, no answer and no vm.

## 2018-01-17 NOTE — Telephone Encounter (Signed)
Brandi Hanson called back stating that they went to the house and pt sister had taken pt out knowing they were coming and they left the UA cup there. That the results would be delayed.

## 2018-01-18 ENCOUNTER — Encounter: Payer: Self-pay | Admitting: Family Medicine

## 2018-01-22 ENCOUNTER — Telehealth: Payer: Self-pay

## 2018-01-22 NOTE — Telephone Encounter (Signed)
Marlowe Kays, a RN from [facility] wanted to know if you received the patient's urine results?  She also wanted to advise that the patient had a fall yesterday and bruised her right arm. She is stable now. Her BP has also been running low. Today it was 92/60.   Please call her with orders and/or results at [phone]. Thanks!

## 2018-01-23 ENCOUNTER — Other Ambulatory Visit: Payer: Self-pay

## 2018-01-23 MED ORDER — NITROFURANTOIN MONOHYD MACRO 100 MG PO CAPS: 100.0000 mg | ORAL_CAPSULE | Freq: Two times a day (BID) | ORAL | 0 refills | Status: DC

## 2018-01-23 NOTE — Telephone Encounter (Signed)
Advised and antibiotic sent in

## 2018-01-23 NOTE — Telephone Encounter (Signed)
I have not seen urine.

## 2018-02-11 ENCOUNTER — Telehealth: Payer: Self-pay | Admitting: Neurology

## 2018-02-11 ENCOUNTER — Encounter: Payer: Self-pay | Admitting: Neurology

## 2018-02-11 ENCOUNTER — Ambulatory Visit: Payer: Medicare Other | Admitting: Neurology

## 2018-02-11 DIAGNOSIS — G309 Alzheimer's disease, unspecified: Secondary | ICD-10-CM

## 2018-02-11 MED ORDER — MEMANTINE HCL ER 7 MG PO CP24: 28.0000 mg | ORAL_CAPSULE | Freq: Every day | ORAL | Status: DC

## 2018-02-11 NOTE — Progress Notes (Signed)
[facility] [address] [place]. [place] [address] [phone]       OFFICE FOLLOW UP NOTE  Ms. Lidia Clavijo [facility] Date of Birth:  1935/12/01 Medical Record Number:  [id number]   Referring MD:  Gaspar Garbe  Reason for Referral:  Memory loss  HPI: 89 year Caucasian lady  having mild progressive memory and cognitive difficulties for last 2 years which have been mildly progressive.She forgets  recent  Events, tasks and at times struggles to find words and finish sentences stopping in midsentence or occcasionally even stuttering..She misplaces things.at times she has been noticed as staring blankly into space and transiently unaware of her surroundings.She has longstanding h/o anxiety and has been on Librax 2.5 mg twice daily  For years and Inderal LA 60 mg has been added recently.She is yet independent in her ADLs and lives alone. There is no h/o headache, seizure, TIA, stroke, significant head injury with loss of consciousness.She has not had any brain imaging or lab work for causes of memory loss or detailed neuropsych testing. Update 08/12/13 : She returns for followup today and states that she did not start taking the fish oil as I had instructed and took only  the samples of Cerefolin I gave her and when she ran out she did not fill the prescription. She states her memory difficulties the about the same and anxiety seems to be increased. She did undergo the tests which I had ordered however. Vitamin B12, TSH and RPR on 06/09/13 were all normal. EEG done and 06/19/13 was normal without any epileptiform features. MRI scan of the brain on 06/20/13 showed mild changes of chronic microvascular ischemia and generalized cerebral atrophy. She admits that she is quite anxious and she has been taking Lipper ask and Inderal for a long time and has seen only her primary physician and perhaps may benefit by referral to a psychiatrist. She did not keep the appointment which we had made  [facility] as she prefers Dr Valentina Shaggy in [place] which is shorter drive for her. Update 02/04/2015 : She returns for follow-up today after last visit in December 2014. She is accompanied by sister. She continues to have cognitive difficulties mainly with short-term memory, multitasking, concentration. These subjectively seems to be worse but on objective testing today she actually did not score significantly less than last visit. She however did undergo detailed neuropsych testing on 10/22/14 by Dr. 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 Compared with similar testing by her in 2015 there appeared to be slight worsening. She was started on Aricept 5 mg daily 3 months ago by her primary physician she is tolerating it well without any GI or CNS side effects but the patient feels she is not subjectively any better. She was found to have difficulty with complex visual processing and advised to not drive or limit her driving. The patient informs me that she's not had any problems with driving and on my neurological exam she has full fields of vision and no major neurological deficits. She saw a psychiatrist in [place] but was not happy with him as he did not change her medications. Update 04/15/2015 : She returns for follow-up after last visit 2 months ago a complaint by sister and nephew. She continues to have memory difficulties as well as periods of transient confusion and disorientation off and on. She has now been on Aricept 10 mg for  the last 2 months and seems to be tolerating it reasonably well without significant GI side effects or dizziness. She has been driving without major problems but restricted to short distances. She does have difficulty with remembering directions. She has not had any delusions, hallucinations, falls, balance problems or safety concerns. On Mini-Mental status exam  today she scored 25 which is actually a drop from 29 from last visit. Update 06/23/15 : She returns for follow-up to last visit 2 months ago. She is a compared by her daughter and son. They have noticed some improvement in her memory and confusion. He was not able to tolerate Namenda when she increase the dose to 10 mg twice daily and hence went down to 5 mg twice daily on 05/18/15 which is tolerating much better. She in fact scored 29/30 on the Mini-Mental which is improved from 25/30 at last visit. She still has some good days and bad days. But she is more interactive and can produce patent conversations better. She denies significant nausea, vomiting, diarrhea or dizziness. There have been no major safety of issues. She has some mild impaired balance but has not had major injury or fall. She does not have significant delusions or hallucinations but does occasionally get agitated but can be redirected. She continues to live alone and her daughter lives across the street and has close supervision. She is driving to a limited extent and there have been no accidents. Update 01/21/2016 : She returns for follow-up after last visit 6 months ago. She is accompanied by her daughter and grandson. She continues to do resumed it well. She still has problems with numbers and names and calculations. She still lives alone. Family provides some supervision but mostly she is independent. She has own social circles and does go out with large groups. She does also go to the [facility] 3 days a week for exercise. She remains a bit and likes to do word gambles but does not do another cognitively challenging activities. She walks pretty good and there have been no safety concerns identified. She's not had any recent falls. She has not had any delusions or hallucinations. She does have significant anxiety and does get upset from time to time. She has a prescription for Librax to be taken 3 times a day but she usually takes it not more than  twice. The daughter feels that by late afternoon she is very anxious and may benefit by taking an additional dose at lunchtime She still driving very limited mostly during daytime to family or crowds. Update 07/31/2016 : She returns for follow-up after last visit 6 months ago. She is accompanied by her sister and nephew. Patient continues to have mild cognitive difficulties with short-term memory, forgetting names. At times unable to complete sentences. She also has anxiety. The family feels at times when several people are talking she has trouble focusing and paying attention and can zone out. She gets quite frustrated when there is conversation in a crowd. She still has trouble remembering dates and appointments. Family feels she may also be losing some interest in visiting places and going out of the house. She still continues to drive and has gotten lost a couple of occasions. The sister makes every attempt to be with the patient when she drives. The patient has recently gotten new primary care physician. On Mini-Mental status exam today she scored 30/30 which is unchanged from last visit. She remains on Aricept and Namenda which is tolerating well without side effects. Update  05/21/2017 : She returns for follow-up after last visit 3 months ago. She is accompanied by sister and son. Patient had some initial trouble tolerating the increased dose of Namenda 10 mg twice daily with some hallucinations but she's done better now in last few months. She however cognitively is about the same. She is having some word finding difficulties and does her physical errors and uses wrong word substitution. She has had not any major issues with agitation not unsafe behavior of falls. She does get some hallucinations at times. The patient's family has now decided to consider participation in the TRAILBLAZER dementia trial. Update 11/20/2017 : Patient is seen for follow-up after last visit 6 months ago. She is accompanied by her  son and sister who states that they have noticed cognitive worsening since Christmastime. Patient gets confused easily. She is often disoriented. She needs more help, sister who lives right across. Sister helps her with medications. Patient has difficult time answering phones correctly for telling time. She is stop cocaine. She often last same questions repeatedly. There have been no changes in her behavior, agitation, delusions or hallucinations noted. Patient has decided to participate in the Trailblazer trial. She is currently on Aricept 10 mg daily and Namenda 10 mg twice daily. She has been reluctant to increase the dose of the medications due to possibility of side effects. Update 02/11/2018 : She returns for follow-up after last visit nearly 3 months ago.  She is accompanied by her sister and nephew.  They both report that they have noticed gradual progressive cognitive worsening with the patient.  She gets confused quite easily.  She mixes up her days and nights.  For example she was sending out cards in June for but dates in February.  She is also had some balance and difficulties and fell once while getting out of bed.  She has occasional hallucinations as well.  She often wakes him disoriented.  She is still living alone and her sister lives across the street and checks on her frequently.  She has a caregiver who works 4 hours 2 days a week with her.  Patient remains on Aricept 10 mg daily and Namenda 10 mg twice daily.  She did try out for the Trailblazer dementia study but was a screen failure as she had excessive tau accumulation on the study screening PET scan.  She had lab work done for reversible causes of cognitive impairment at last visit which was unremarkable.  MRI scan of the brain was not done.  She remains a pleasant disposition and can be redirected easily when she is confused or disoriented.  There has been no agitation or violent behaviors noted.  She does not wander off. ROS:   14 system  review of systems is positive for  hearing loss, ringing in the ears, runny nose, light sensitivity, cough, leg swelling and palpitations, abdominal pain, constipation, diarrhea, apnea, back pain, aching muscles, walking difficulty, skin moles, easy bruising and bleeding, memory loss, dizziness, speech difficulty, tremors, agitation, decreased concentration, depression, nervousness, anxiety, hallucinations and all other systems negative   PMH:  Past Medical History:  Diagnosis Date  . Abdominal distention   . Abdominal pain   . Anxiety   . Arthritis   . Benign essential HTN 01/02/2012  . Breast cancer in situ 07/22/2011  . Cancer ([place])    skin  . Confusion   . Depression   . Generalized headaches   . GERD (gastroesophageal reflux disease)   . Hyperlipidemia   .  Hyperlipidemia 01/02/2012  . Hypertension   . Memory loss   . Osteoporosis   . Palpitations   . PVC (premature ventricular contraction)   . Rectal pain   . Sleep apnea     Social History:  Social History   Socioeconomic History  . Marital status: Single    Spouse name: Not on file  . Number of children: Not on file  . Years of education: Not on file  . Highest education level: Not on file  Occupational History  . Not on file  Social Needs  . Financial resource strain: Not on file  . Food insecurity:    Worry: Not on file    Inability: Not on file  . Transportation needs:    Medical: Not on file    Non-medical: Not on file  Tobacco Use  . Smoking status: Never Smoker  . Smokeless tobacco: Never Used  Substance and Sexual Activity  . Alcohol use: No  . Drug use: No  . Sexual activity: Never  Lifestyle  . Physical activity:    Days per week: Not on file    Minutes per session: Not on file  . Stress: Not on file  Relationships  . Social connections:    Talks on phone: Not on file    Gets together: Not on file    Attends religious service: Not on file    Active member of club or organization: Not on file     Attends meetings of clubs or organizations: Not on file    Relationship status: Not on file  . Intimate partner violence:    Fear of current or ex partner: Not on file    Emotionally abused: Not on file    Physically abused: Not on file    Forced sexual activity: Not on file  Other Topics Concern  . Not on file  Social History Narrative  . Not on file    Medications:   Current Outpatient Medications on File Prior to Visit  Medication Sig Dispense Refill  . acetaminophen (TYLENOL) 500 MG tablet Take 500 mg by mouth every 6 (six) hours as needed.    Marland Kitchen alendronate (FOSAMAX) 70 MG tablet TAKE 1 TABLET BY MOUTH EVERY 7 DAYS. TAKE WITH A FULL GLASS OF WATER ON AN EMPTY STOMACH 4 tablet 12  . calcium carbonate (TUMS - DOSED IN MG ELEMENTAL CALCIUM) 500 MG chewable tablet Chew 1 tablet by mouth. PRN    . Cholecalciferol (VITAMIN D3) 1000 UNITS CAPS Take 2 capsules by mouth daily.    Marland Kitchen donepezil (ARICEPT) 10 MG tablet TAKE 1 TABLET BY MOUTH AT BEDTIME 90 tablet 3  . mometasone (ELOCON) 0.1 % lotion     . propranolol (INDERAL) 20 MG tablet Take 1 tablet (20 mg total) by mouth 2 (two) times daily. 360 tablet 4  . sertraline (ZOLOFT) 100 MG tablet Take 1 tablet (100 mg total) by mouth daily. 30 tablet 12   No current facility-administered medications on file prior to visit.     Allergies:   Allergies  Allergen Reactions  . Sulfa Antibiotics Nausea Only    Physical Exam General: frail elderly Caucasian lady, seated, in no evident distress Head: head normocephalic and atraumatic.   Neck: supple with no carotid or supraclavicular bruits Cardiovascular: regular rate and rhythm, no murmurs Musculoskeletal: no deformity Skin:  no rash/petichiae Vascular:  Normal pulses all extremities Vitals:   02/11/18 0916  BP: 122/69  Pulse: 73    Neurologic Exam Mental  Status: Awake and fully alert. Oriented to place and time. Recent and remote memory intact. Attention span, concentration and  fund of knowledge appropriate. Mood and affect appropriate. MMSE 20/30( unchanged from last visit 11/27/17 ) with deficits in orientation, attention and calculation. Able to name 9 animals with full legs.. Clock drawing 3/4.  Cranial Nerves: Fundoscopic exam not done.  Pupils equal, briskly reactive to light. Extraocular movements full without nystagmus. Visual fields full to confrontation. Hearing intact. Facial sensation intact. Face, tongue, palate moves normally and symmetrically.  Motor: Normal bulk and tone. Normal strength in all tested extremity muscles. Sensory.: intact to tough and pinprick and vibratory.  Coordination: Rapid alternating movements normal in all extremities. Finger-to-nose and heel-to-shin performed accurately bilaterally. Gait and Station: Arises from chair without difficulty. Stance is normal. Gait demonstrates normal stride length and balance . Able to heel, toe and tandem walk with slight difficulty.  Reflexes: 1+ and symmetric. Toes downgoing.      ASSESSMENT: 89 year lady with progressive mild memory difficulties  X 3 years-likely  Mild Alzheimer`s dementia due to  significant progression over last 2 years and some response to Aricept and Namenda    PLAN: I had a long discussion with the patient, her sister and nephew regarding her Alzheimer's dementia which appears to be progressing.  I recommend increasing the dose of Namenda to 28 mg XR and continue Aricept 10 mg daily.  Check MRI scan of the brain which was not done at the last visit and his EEG did show focal slowing.  I have discussed possible side effects with the patient and family and answered questions.  The patient should look towards getting more help at home and eventually moving into long-term care facility as it is going to be getting to be progressively difficult for her to manage on her own.  Family understands.  She will return for follow-up in 3 months or call earlier if necessary Greater than 50%  time during this 25 minute visit was spent on counseling and coordination of care about her dementia.   Antony Contras, MD

## 2018-02-11 NOTE — Patient Instructions (Signed)
I had a long discussion with the patient, her sister and nephew regarding her Alzheimer's dementia which appears to be progressing.  I recommend increasing the dose of Namenda to 28 mg XR and continue Aricept 10 mg daily.  Check MRI scan of the brain which was not done at the last visit and his EEG did show focal slowing.  I have discussed possible side effects with the patient and family and answered questions.  The patient should look towards getting more help at home and eventually moving into long-term care facility as it is going to be getting to be progressively difficult for her to manage on her own.  Family understands.  She will return for follow-up in 3 months or call earlier if necessary

## 2018-02-11 NOTE — Telephone Encounter (Signed)
[employer] Medicare order sent to [facility]. No auth they will reach out to the pt to schedule.  °

## 2018-02-12 ENCOUNTER — Other Ambulatory Visit: Payer: Self-pay

## 2018-02-12 ENCOUNTER — Telehealth: Payer: Self-pay | Admitting: Neurology

## 2018-02-12 MED ORDER — MEMANTINE HCL ER 28 MG PO CP24: 28.0000 mg | ORAL_CAPSULE | Freq: Every day | ORAL | 3 refills | Status: DC

## 2018-02-12 NOTE — Telephone Encounter (Signed)
Pt's sister called checking on status of script for memantine (NAMENDA XR) 24 hr capsule 28 mg. She went to the pharmacy today to pick it up but it was not sent in. Phone rep advised her to check with pharmacy tomorrow. She was appreciative

## 2018-02-12 NOTE — Telephone Encounter (Signed)
RN receive verbal order from [facility]. The original rx did not get sent via epic. Med resent again to [facility].

## 2018-02-22 ENCOUNTER — Other Ambulatory Visit: Payer: Medicare Other

## 2018-02-26 ENCOUNTER — Ambulatory Visit
Admission: RE | Admit: 2018-02-26 | Discharge: 2018-02-26 | Disposition: A | Discharge status: Home / Self Care | Payer: Medicare Other | Source: Ambulatory Visit | Attending: Neurology | Admitting: Neurology

## 2018-02-26 ENCOUNTER — Ambulatory Visit: Payer: Medicare Other | Admitting: Neurology

## 2018-02-26 DIAGNOSIS — G309 Alzheimer's disease, unspecified: Secondary | ICD-10-CM | POA: Diagnosis not present

## 2018-02-26 MED ORDER — GADOBENATE DIMEGLUMINE 529 MG/ML IV SOLN
10.0000 mL | Freq: Once | INTRAVENOUS | Status: AC | PRN
  Administered 2018-02-26: 10 mL via INTRAVENOUS

## 2018-03-04 ENCOUNTER — Other Ambulatory Visit: Payer: Self-pay | Admitting: Family Medicine

## 2018-03-04 ENCOUNTER — Telehealth: Payer: Self-pay

## 2018-03-04 NOTE — Telephone Encounter (Signed)
Notes recorded by Marval Regal, RN on 03/04/2018 at 10:52 AM EDT RN call Brandi Hanson pts sister on dpr. RN stated the MRI of brain shows shrinkage of the brain which appears slightly advanced compared with previous MRI from 2015.No new or worrisome findings. Brandi Hanson wrote down results The sister verbalized understanding. ------

## 2018-03-04 NOTE — Telephone Encounter (Signed)
-----   Message from Garvin Fila, MD sent at 02/28/2018  4:31 PM EDT ----- Brandi Hanson inform the patient had MRI scan of the brain shows age-related shrinkage of the brain which appears slightly advanced compared with previous MRI from 2015. Not any new or worrisome finding

## 2018-03-11 ENCOUNTER — Ambulatory Visit: Payer: Self-pay | Admitting: Family Medicine

## 2018-03-12 ENCOUNTER — Other Ambulatory Visit: Payer: Medicare Other

## 2018-03-12 ENCOUNTER — Ambulatory Visit: Payer: Self-pay | Admitting: Neurology

## 2018-03-13 ENCOUNTER — Ambulatory Visit: Payer: Medicare Other | Admitting: Family Medicine

## 2018-03-13 VITALS — BP 114/72 | HR 66 | Temp 97.4°F | Resp 14 | Wt 125.0 lb

## 2018-03-13 DIAGNOSIS — Z111 Encounter for screening for respiratory tuberculosis: Secondary | ICD-10-CM | POA: Diagnosis not present

## 2018-03-13 DIAGNOSIS — C50412 Malignant neoplasm of upper-outer quadrant of left female breast: Secondary | ICD-10-CM

## 2018-03-13 DIAGNOSIS — Z170 Estrogen receptor positive status [ER+]: Secondary | ICD-10-CM | POA: Diagnosis not present

## 2018-03-13 DIAGNOSIS — G301 Alzheimer's disease with late onset: Secondary | ICD-10-CM

## 2018-03-13 DIAGNOSIS — F0280 Dementia in other diseases classified elsewhere without behavioral disturbance: Secondary | ICD-10-CM

## 2018-03-13 NOTE — Progress Notes (Signed)
Brandi Hanson  MRN: [id number] DOB: Jul 24, 1936  Subjective:  HPI   The patient is an 82 year old female who presents for follow up from 2 months ago.  She was seen in 01/07/18 for her progressive dementia.  The patient is planning to go to [facility].  She and her sister have the forms to be filled out before she can be admitted. No behavior issues. Patient Active Problem List   Diagnosis Date Noted  . Aortic atherosclerosis ([place]) 01/03/2017  . Renal calculi 01/03/2017  . Allergic rhinitis 07/07/2015  . MCI (mild cognitive impairment) with memory loss 03/25/2015  . Depression, major, recurrent, mild ([place]) 03/25/2015  . Generalized anxiety disorder 03/25/2015  . IBS (irritable bowel syndrome) 02/15/2015  . At risk for falling 01/08/2015  . Body mass index (BMI) of 23.0-23.9 in adult 01/08/2015  . H/O malignant neoplasm of breast 01/08/2015  . Chronic constipation 01/08/2015  . Breast cancer of upper-outer quadrant of left female breast ([place]) 01/08/2015  . HZV (herpes zoster virus) post herpetic neuralgia 01/08/2015  . Apnea, sleep 01/08/2015  . Palpitations 08/21/2013  . Benign essential HTN 01/02/2012  . Beat, premature ventricular 04/21/2009  . Absolute anemia 02/05/2009  . Acid reflux 02/05/2009  . Essential (primary) hypertension 02/05/2009  . Bone/cartilage disorder 07/22/2007  . Avitaminosis D 10/08/2006  . Carotid artery obstruction 09/11/1998    Past Medical History:  Diagnosis Date  . Abdominal distention   . Abdominal pain   . Anxiety   . Arthritis   . Benign essential HTN 01/02/2012  . Breast cancer in situ 07/22/2011  . Cancer ([place])    skin  . Confusion   . Depression   . Generalized headaches   . GERD (gastroesophageal reflux disease)   . Hyperlipidemia   . Hyperlipidemia 01/02/2012  . Hypertension   . Memory loss   . Osteoporosis   . Palpitations   . PVC (premature ventricular contraction)   . Rectal pain   . Sleep apnea     Social History     Socioeconomic History  . Marital status: Single    Spouse name: Not on file  . Number of children: Not on file  . Years of education: Not on file  . Highest education level: Not on file  Occupational History  . Not on file  Social Needs  . Financial resource strain: Not on file  . Food insecurity:    Worry: Not on file    Inability: Not on file  . Transportation needs:    Medical: Not on file    Non-medical: Not on file  Tobacco Use  . Smoking status: Never Smoker  . Smokeless tobacco: Never Used  Substance and Sexual Activity  . Alcohol use: No  . Drug use: No  . Sexual activity: Never  Lifestyle  . Physical activity:    Days per week: Not on file    Minutes per session: Not on file  . Stress: Not on file  Relationships  . Social connections:    Talks on phone: Not on file    Gets together: Not on file    Attends religious service: Not on file    Active member of club or organization: Not on file    Attends meetings of clubs or organizations: Not on file    Relationship status: Not on file  . Intimate partner violence:    Fear of current or ex partner: Not on file    Emotionally abused: Not  on file    Physically abused: Not on file    Forced sexual activity: Not on file  Other Topics Concern  . Not on file  Social History Narrative  . Not on file    Outpatient Encounter Medications as of 03/13/2018  Medication Sig Note  . acetaminophen (TYLENOL) 500 MG tablet Take 500 mg by mouth every 6 (six) hours as needed.   Marland Kitchen alendronate (FOSAMAX) 70 MG tablet TAKE 1 TABLET BY MOUTH EVERY 7 DAYS. TAKE WITH A FULL GLASS OF WATER ON AN EMPTY STOMACH   . calcium carbonate (TUMS - DOSED IN MG ELEMENTAL CALCIUM) 500 MG chewable tablet Chew 1 tablet by mouth. PRN 01/08/2015: Received from: [facility]  . Cholecalciferol (VITAMIN D3) 1000 UNITS CAPS Take 2 capsules by mouth daily. 01/08/2015: Received from: [facility]  .  clidinium-chlordiazePOXIDE (LIBRAX) 5-2.5 MG capsule TAKE 1 OR 2 CAPSULES BY MOUTH 3 TIMES A DAY AS NEEDED   . donepezil (ARICEPT) 10 MG tablet TAKE 1 TABLET BY MOUTH AT BEDTIME   . memantine (NAMENDA XR) 28 MG CP24 24 hr capsule Take 1 capsule (28 mg total) by mouth daily.   . mometasone (ELOCON) 0.1 % lotion    . propranolol (INDERAL) 20 MG tablet Take 1 tablet (20 mg total) by mouth 2 (two) times daily.   . sertraline (ZOLOFT) 100 MG tablet Take 1 tablet (100 mg total) by mouth daily.    No facility-administered encounter medications on file as of 03/13/2018.     Allergies  Allergen Reactions  . Sulfa Antibiotics Nausea Only    Review of Systems  Constitutional: Negative for fever and malaise/fatigue.  HENT: Negative.   Eyes: Negative.   Respiratory: Negative for cough, shortness of breath and wheezing.   Cardiovascular: Positive for leg swelling (ankles). Negative for chest pain and palpitations.  Gastrointestinal: Negative.   Genitourinary: Negative.   Musculoskeletal: Negative.   Skin: Negative.   Endo/Heme/Allergies: Negative.   Psychiatric/Behavioral: Positive for memory loss.    Objective:  BP 114/72 (BP Location: Right Arm, Patient Position: Sitting, Cuff Size: Normal)   Pulse 66   Temp (!) 97.4 F (36.3 C) (Oral)   Resp 14   Wt 125 lb (56.7 kg)   BMI 20.80 kg/m   Physical Exam  Constitutional: She is well-developed, well-nourished, and in no distress.  HENT:  Head: Normocephalic and atraumatic.  Eyes: Conjunctivae are normal. No scleral icterus.  Neck: No thyromegaly present.  Cardiovascular: Normal rate, regular rhythm and normal heart sounds.  Pulmonary/Chest: Effort normal and breath sounds normal.  Abdominal: Soft.  Neurological: Gait normal.  Skin: Skin is warm and dry.  Psychiatric: Mood, memory, affect and judgment normal.    Assessment and Plan :  1. Screening-pulmonary TB  - QuantiFERON-TB Gold Plus 2.Alzheimers Dementia--progressive More  than 50% of visit spent in counseling. Paperwork done for placement in Assisted living. 3.Osteoporosis 4.h/o Breast Cancer  I have done the exam and reviewed the chart and it is accurate to the best of my knowledge. Development worker, community has been used and  any errors in dictation or transcription are unintentional. Miguel Aschoff M.D. [facility]

## 2018-03-20 ENCOUNTER — Other Ambulatory Visit: Payer: Medicare Other

## 2018-03-20 ENCOUNTER — Telehealth: Payer: Self-pay

## 2018-03-20 DIAGNOSIS — R410 Disorientation, unspecified: Secondary | ICD-10-CM

## 2018-03-20 LAB — QUANTIFERON-TB GOLD PLUS
QUANTIFERON TB1 AG VALUE: 0.02 [IU]/mL
QuantiFERON Mitogen Value: 2.43 IU/mL
QuantiFERON Nil Value: 0.03 IU/mL
QuantiFERON TB2 Ag Value: 0.02 IU/mL
QuantiFERON-TB Gold Plus: NEGATIVE

## 2018-03-20 NOTE — Telephone Encounter (Signed)
-----   Message from Jerrol Banana., MD sent at 03/20/2018  1:59 PM EDT ----- Negative TB

## 2018-03-20 NOTE — Telephone Encounter (Signed)
Left message to call back  

## 2018-03-20 NOTE — Telephone Encounter (Signed)
Advised patient sister as below.

## 2018-03-21 ENCOUNTER — Telehealth: Payer: Self-pay | Admitting: Family Medicine

## 2018-03-21 NOTE — Telephone Encounter (Signed)
Elta Guadeloupe called and wants to know if we can fax his aunt Brandi Hanson's tb skin results along with the paper he left up front to [name].  He didn't have the fax number.  He wants to get her moved in this weekend.  He said to out the hard cop in an envelope and he will pick up later  Thanks teri

## 2018-03-21 NOTE — Telephone Encounter (Signed)
Faxed over lab results. Hard copy placed up front. L/M for Elta Guadeloupe advising him that it was faxed.

## 2018-03-22 ENCOUNTER — Telehealth: Payer: Self-pay | Admitting: Family Medicine

## 2018-03-22 NOTE — Telephone Encounter (Signed)
Brandi Hanson faxed an FL2 form that needs to be resigned and dated and faxed back [phone].  Pt is moving into [name] today  Thanks teri

## 2018-03-22 NOTE — Telephone Encounter (Signed)
Waiting to receive the fax

## 2018-03-26 ENCOUNTER — Other Ambulatory Visit: Payer: Self-pay | Admitting: Neurology

## 2018-03-26 DIAGNOSIS — R413 Other amnesia: Secondary | ICD-10-CM

## 2018-03-26 NOTE — Telephone Encounter (Signed)
[name] called ans is still needing the FL2 form for Brandi Hanson  Thanks [employer]

## 2018-03-27 NOTE — Telephone Encounter (Signed)
This has already been faxed and Chrys Racer has the confirmation.  She will fax again in the event they don't have it.

## 2018-03-28 ENCOUNTER — Telehealth: Payer: Self-pay

## 2018-03-28 NOTE — Telephone Encounter (Signed)
-----   Message from Garvin Fila, MD sent at 03/28/2018  8:50 AM EDT ----- Mitchell Heir inform the patient that EEG study shows slight irritability of the brain on the left side but no definite seizure activity. No need for any treatment change.

## 2018-03-28 NOTE — Telephone Encounter (Signed)
Rn call patients sister [name] that the EEG shows irritability of the brain on left side, but no seizure activity noted.No treatment needed. [name] verbalized understanding. ------

## 2018-04-15 ENCOUNTER — Telehealth: Payer: Self-pay | Admitting: Family Medicine

## 2018-04-15 NOTE — Telephone Encounter (Signed)
We can see if they will take Verbal order for Tums 1 tab TID prn indigestion/reflux.  Virginia Crews, MD, MPH [facility] 04/15/2018 11:31 AM

## 2018-04-15 NOTE — Telephone Encounter (Signed)
Faxed and sister advised.

## 2018-04-15 NOTE — Telephone Encounter (Signed)
pt's sister Wynona Canes called saying pt is at [facility] and needs an order to take Tums.  She usually takes them everyday but [name] will not give her one without an order.  CB# is [phone] or [phone]   Thanks teri

## 2018-04-15 NOTE — Telephone Encounter (Signed)
Brandi Hanson needs this order faxed to [phone]. Please write on an rx pad? Thanks!

## 2018-04-15 NOTE — Telephone Encounter (Signed)
Please advise 

## 2018-05-20 ENCOUNTER — Encounter: Payer: Self-pay | Admitting: Neurology

## 2018-05-20 ENCOUNTER — Ambulatory Visit: Payer: Medicare Other | Admitting: Neurology

## 2018-05-20 VITALS — BP 110/82 | HR 68 | Ht 65.0 in | Wt 120.5 lb

## 2018-05-20 DIAGNOSIS — F0280 Dementia in other diseases classified elsewhere without behavioral disturbance: Secondary | ICD-10-CM | POA: Diagnosis not present

## 2018-05-20 DIAGNOSIS — G301 Alzheimer's disease with late onset: Secondary | ICD-10-CM | POA: Diagnosis not present

## 2018-05-20 NOTE — Progress Notes (Signed)
[facility] [address] [place]. [place] [address] [phone]       OFFICE FOLLOW UP NOTE  [name] [facility] Date of Birth:  1935/12/01 Medical Record Number:  [id number]   Referring MD:  Gaspar Garbe  Reason for Referral:  Memory loss  HPI: 89 year Caucasian lady  having mild progressive memory and cognitive difficulties for last 2 years which have been mildly progressive.She forgets  recent  Events, tasks and at times struggles to find words and finish sentences stopping in midsentence or occcasionally even stuttering..She misplaces things.at times she has been noticed as staring blankly into space and transiently unaware of her surroundings.She has longstanding h/o anxiety and has been on Librax 2.5 mg twice daily  For years and Inderal LA 60 mg has been added recently.She is yet independent in her ADLs and lives alone. There is no h/o headache, seizure, TIA, stroke, significant head injury with loss of consciousness.She has not had any brain imaging or lab work for causes of memory loss or detailed neuropsych testing. Update 08/12/13 : She returns for followup today and states that she did not start taking the fish oil as I had instructed and took only  the samples of Cerefolin I gave her and when she ran out she did not fill the prescription. She states her memory difficulties the about the same and anxiety seems to be increased. She did undergo the tests which I had ordered however. Vitamin B12, TSH and RPR on 06/09/13 were all normal. EEG done and 06/19/13 was normal without any epileptiform features. MRI scan of the brain on 06/20/13 showed mild changes of chronic microvascular ischemia and generalized cerebral atrophy. She admits that she is quite anxious and she has been taking Lipper ask and Inderal for a long time and has seen only her primary physician and perhaps may benefit by referral to a psychiatrist. She did not keep the appointment which we had made  [facility] as she prefers Dr Valentina Shaggy in [place] which is shorter drive for her. Update 02/04/2015 : She returns for follow-up today after last visit in December 2014. She is accompanied by sister. She continues to have cognitive difficulties mainly with short-term memory, multitasking, concentration. These subjectively seems to be worse but on objective testing today she actually did not score significantly less than last visit. She however did undergo detailed neuropsych testing on 10/22/14 by Dr. 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 Compared with similar testing by her in 2015 there appeared to be slight worsening. She was started on Aricept 5 mg daily 3 months ago by her primary physician she is tolerating it well without any GI or CNS side effects but the patient feels she is not subjectively any better. She was found to have difficulty with complex visual processing and advised to not drive or limit her driving. The patient informs me that she's not had any problems with driving and on my neurological exam she has full fields of vision and no major neurological deficits. She saw a psychiatrist in [place] but was not happy with him as he did not change her medications. Update 04/15/2015 : She returns for follow-up after last visit 2 months ago a complaint by sister and nephew. She continues to have memory difficulties as well as periods of transient confusion and disorientation off and on. She has now been on Aricept 10 mg for  the last 2 months and seems to be tolerating it reasonably well without significant GI side effects or dizziness. She has been driving without major problems but restricted to short distances. She does have difficulty with remembering directions. She has not had any delusions, hallucinations, falls, balance problems or safety concerns. On Mini-Mental status exam  today she scored 25 which is actually a drop from 29 from last visit. Update 06/23/15 : She returns for follow-up to last visit 2 months ago. She is a compared by her daughter and son. They have noticed some improvement in her memory and confusion. He was not able to tolerate Namenda when she increase the dose to 10 mg twice daily and hence went down to 5 mg twice daily on 05/18/15 which is tolerating much better. She in fact scored 29/30 on the Mini-Mental which is improved from 25/30 at last visit. She still has some good days and bad days. But she is more interactive and can produce patent conversations better. She denies significant nausea, vomiting, diarrhea or dizziness. There have been no major safety of issues. She has some mild impaired balance but has not had major injury or fall. She does not have significant delusions or hallucinations but does occasionally get agitated but can be redirected. She continues to live alone and her daughter lives across the street and has close supervision. She is driving to a limited extent and there have been no accidents. Update 01/21/2016 : She returns for follow-up after last visit 6 months ago. She is accompanied by her daughter and grandson. She continues to do resumed it well. She still has problems with numbers and names and calculations. She still lives alone. Family provides some supervision but mostly she is independent. She has own social circles and does go out with large groups. She does also go to the [facility] 3 days a week for exercise. She remains a bit and likes to do word gambles but does not do another cognitively challenging activities. She walks pretty good and there have been no safety concerns identified. She's not had any recent falls. She has not had any delusions or hallucinations. She does have significant anxiety and does get upset from time to time. She has a prescription for Librax to be taken 3 times a day but she usually takes it not more than  twice. The daughter feels that by late afternoon she is very anxious and may benefit by taking an additional dose at lunchtime She still driving very limited mostly during daytime to family or crowds. Update 07/31/2016 : She returns for follow-up after last visit 6 months ago. She is accompanied by her sister and nephew. Patient continues to have mild cognitive difficulties with short-term memory, forgetting names. At times unable to complete sentences. She also has anxiety. The family feels at times when several people are talking she has trouble focusing and paying attention and can zone out. She gets quite frustrated when there is conversation in a crowd. She still has trouble remembering dates and appointments. Family feels she may also be losing some interest in visiting places and going out of the house. She still continues to drive and has gotten lost a couple of occasions. The sister makes every attempt to be with the patient when she drives. The patient has recently gotten new primary care physician. On Mini-Mental status exam today she scored 30/30 which is unchanged from last visit. She remains on Aricept and Namenda which is tolerating well without side effects. Update  05/21/2017 : She returns for follow-up after last visit 3 months ago. She is accompanied by sister and son. Patient had some initial trouble tolerating the increased dose of Namenda 10 mg twice daily with some hallucinations but she's done better now in last few months. She however cognitively is about the same. She is having some word finding difficulties and does her physical errors and uses wrong word substitution. She has had not any major issues with agitation not unsafe behavior of falls. She does get some hallucinations at times. The patient's family has now decided to consider participation in the TRAILBLAZER dementia trial. Update 11/20/2017 : Patient is seen for follow-up after last visit 6 months ago. She is accompanied by her  son and sister who states that they have noticed cognitive worsening since Christmastime. Patient gets confused easily. She is often disoriented. She needs more help, sister who lives right across. Sister helps her with medications. Patient has difficult time answering phones correctly for telling time. She is stop cocaine. She often last same questions repeatedly. There have been no changes in her behavior, agitation, delusions or hallucinations noted. Patient has decided to participate in the Trailblazer trial. She is currently on Aricept 10 mg daily and Namenda 10 mg twice daily. She has been reluctant to increase the dose of the medications due to possibility of side effects. Update 02/11/2018 : She returns for follow-up after last visit nearly 3 months ago.  She is accompanied by her sister and nephew.  They both report that they have noticed gradual progressive cognitive worsening with the patient.  She gets confused quite easily.  She mixes up her days and nights.  For example she was sending out cards in June for but dates in February.  She is also had some balance and difficulties and fell once while getting out of bed.  She has occasional hallucinations as well.  She often wakes him disoriented.  She is still living alone and her sister lives across the street and checks on her frequently.  She has a caregiver who works 4 hours 2 days a week with her.  Patient remains on Aricept 10 mg daily and Namenda 10 mg twice daily.  She did try out for the Trailblazer dementia study but was a screen failure as she had excessive tau accumulation on the study screening PET scan.  She had lab work done for reversible causes of cognitive impairment at last visit which was unremarkable.  MRI scan of the brain was not done.  She remains a pleasant disposition and can be redirected easily when she is confused or disoriented.  There has been no agitation or violent behaviors noted.  She does not wander off. Update 05/20/2018.  :she returns for follow-up last visit 3 months ago. She is accompanied by her sister. They both report that patient appears to be about the same. She is cognitively stable. She is since now moved to living in the assisted living. All her medications supervising given to her. She remains I septum milligrams daily and Namenda XR 28 mg daily which is tolerating well without side effects. She's not had any new health issues.The patient does get disoriented easily. She cannot follow her activity calendar and menu even though she writes it down. She did undergo MRI scan of the brain on 02/28/18 which are personally reviewed and shows only mild generalized atrophy and changes of small vessel disease. She has no new complaints today. ROS:   14 system review of systems  is positive for   Runny nose, ringing in the ears, hearing loss, light sensitivity, dryness of eyes, abdominal pain, diarrhea, memory loss, confusion, disorientation and all other systems negative   PMH:  Past Medical History:  Diagnosis Date  . Abdominal distention   . Abdominal pain   . Anxiety   . Arthritis   . Benign essential HTN 01/02/2012  . Breast cancer in situ 07/22/2011  . Cancer ([facility])    skin  . Confusion   . Depression   . Generalized headaches   . GERD (gastroesophageal reflux disease)   . Hyperlipidemia   . Hyperlipidemia 01/02/2012  . Hypertension   . Memory loss   . Osteoporosis   . Palpitations   . PVC (premature ventricular contraction)   . Rectal pain   . Sleep apnea     Social History:  Social History   Socioeconomic History  . Marital status: Single    Spouse name: Not on file  . Number of children: Not on file  . Years of education: Not on file  . Highest education level: Not on file  Occupational History  . Not on file  Social Needs  . Financial resource strain: Not on file  . Food insecurity:    Worry: Not on file    Inability: Not on file  . Transportation needs:    Medical: Not on file     Non-medical: Not on file  Tobacco Use  . Smoking status: Never Smoker  . Smokeless tobacco: Never Used  Substance and Sexual Activity  . Alcohol use: No  . Drug use: No  . Sexual activity: Never  Lifestyle  . Physical activity:    Days per week: Not on file    Minutes per session: Not on file  . Stress: Not on file  Relationships  . Social connections:    Talks on phone: Not on file    Gets together: Not on file    Attends religious service: Not on file    Active member of club or organization: Not on file    Attends meetings of clubs or organizations: Not on file    Relationship status: Not on file  . Intimate partner violence:    Fear of current or ex partner: Not on file    Emotionally abused: Not on file    Physically abused: Not on file    Forced sexual activity: Not on file  Other Topics Concern  . Not on file  Social History Narrative  . Not on file    Medications:   Current Outpatient Medications on File Prior to Visit  Medication Sig Dispense Refill  . acetaminophen (TYLENOL) 500 MG tablet Take 500 mg by mouth every 6 (six) hours as needed.    Marland Kitchen alendronate (FOSAMAX) 70 MG tablet TAKE 1 TABLET BY MOUTH EVERY 7 DAYS. TAKE WITH A FULL GLASS OF WATER ON AN EMPTY STOMACH 4 tablet 12  . calcium carbonate (TUMS - DOSED IN MG ELEMENTAL CALCIUM) 500 MG chewable tablet Chew 1 tablet by mouth. PRN    . Cholecalciferol (VITAMIN D3) 1000 UNITS CAPS Take 2 capsules by mouth daily.    . clidinium-chlordiazePOXIDE (LIBRAX) 5-2.5 MG capsule TAKE 1 OR 2 CAPSULES BY MOUTH 3 TIMES A DAY AS NEEDED 540 capsule 1  . donepezil (ARICEPT) 10 MG tablet TAKE 1 TABLET BY MOUTH AT BEDTIME 90 tablet 3  . loratadine (CLARITIN) 10 MG tablet Take 10 mg by mouth daily.    . memantine (  NAMENDA XR) 28 MG CP24 24 hr capsule Take 1 capsule (28 mg total) by mouth daily. 90 capsule 3  . propranolol (INDERAL) 20 MG tablet Take 1 tablet (20 mg total) by mouth 2 (two) times daily. 360 tablet 4  . Propylene  Glycol (SYSTANE COMPLETE OP) Apply to eye.    . sertraline (ZOLOFT) 100 MG tablet Take 1 tablet (100 mg total) by mouth daily. 30 tablet 12   No current facility-administered medications on file prior to visit.     Allergies:   Allergies  Allergen Reactions  . Sulfa Antibiotics Nausea Only    Physical Exam General: frail elderly Caucasian lady, seated, in no evident distress Head: head normocephalic and atraumatic.   Neck: supple with no carotid or supraclavicular bruits Cardiovascular: regular rate and rhythm, no murmurs Musculoskeletal: no deformity Skin:  no rash/petichiae Vascular:  Normal pulses all extremities Vitals:   05/20/18 0938  BP: 110/82  Pulse: 68    Neurologic Exam Mental Status: Awake and fully alert. Oriented to place and time. Recent and remote memory intact. Attention span, concentration and fund of knowledge appropriate. Mood and affect appropriate. MMSE 21/30( unchanged from last visit 02/11/18 ) with deficits in orientation, attention and calculation. Able to name 9 animals with full legs.. Clock drawing 3/4.  Cranial Nerves: Fundoscopic exam not done.  Pupils equal, briskly reactive to light. Extraocular movements full without nystagmus. Visual fields full to confrontation. Hearing intact. Facial sensation intact. Face, tongue, palate moves normally and symmetrically.  Motor: Normal bulk and tone. Normal strength in all tested extremity muscles. Sensory.: intact to tough and pinprick and vibratory.  Coordination: Rapid alternating movements normal in all extremities. Finger-to-nose and heel-to-shin performed accurately bilaterally. Gait and Station: Arises from chair without difficulty. Stance is normal. Gait demonstrates normal stride length and balance . Able to heel, toe and tandem walk with slight difficulty.  Reflexes: 1+ and symmetric. Toes downgoing.      ASSESSMENT: 77 year lady with progressive mild memory difficulties  X 3 years-likely  Mild  Alzheimer`s dementia due to  significant progression over last 2 years and some response to Aricept and Namenda    PLAN: II had a long discussion the patient and her sister regarding her Alzheimer's dementia which appears to be stable on the current medication regimen. Continue Aricept 10 mg daily and Namenda Xr 28 mg daily. I encouraged the patient to increase participation in cognitively challenging activities like solving crossword puzzles, playing bridge and sudoku. Follow-up in the future in 6 months or call earlier if necessary Greater than 50% time during this 25 minute visit was spent on counseling and coordination of care about her dementia.   Antony Contras, MD

## 2018-05-20 NOTE — Patient Instructions (Signed)
I had a long discussion the patient and her sister regarding her Alzheimer's dementia which appears to be stable on the current medication regimen. Continue Aricept 10 mg daily and Namenda Xr 28 mg daily. I encouraged the patient to increase participation in cognitively challenging activities like solving crossword puzzles, playing bridge and sudoku. Follow-up in the future in 6 months or call earlier if necessary

## 2018-06-25 LAB — HM MAMMOGRAPHY

## 2018-06-26 ENCOUNTER — Ambulatory Visit: Payer: Medicare Other | Admitting: Family Medicine

## 2018-06-26 VITALS — BP 129/73 | HR 78 | Temp 97.6°F | Resp 16 | Wt 121.0 lb

## 2018-06-26 DIAGNOSIS — G8929 Other chronic pain: Secondary | ICD-10-CM

## 2018-06-26 DIAGNOSIS — I700 Atherosclerosis of aorta: Secondary | ICD-10-CM

## 2018-06-26 DIAGNOSIS — G301 Alzheimer's disease with late onset: Secondary | ICD-10-CM

## 2018-06-26 DIAGNOSIS — C50412 Malignant neoplasm of upper-outer quadrant of left female breast: Secondary | ICD-10-CM | POA: Diagnosis not present

## 2018-06-26 DIAGNOSIS — F0280 Dementia in other diseases classified elsewhere without behavioral disturbance: Secondary | ICD-10-CM

## 2018-06-26 DIAGNOSIS — R319 Hematuria, unspecified: Secondary | ICD-10-CM

## 2018-06-26 DIAGNOSIS — Z23 Encounter for immunization: Secondary | ICD-10-CM

## 2018-06-26 DIAGNOSIS — N390 Urinary tract infection, site not specified: Secondary | ICD-10-CM | POA: Diagnosis not present

## 2018-06-26 DIAGNOSIS — Z170 Estrogen receptor positive status [ER+]: Secondary | ICD-10-CM

## 2018-06-26 DIAGNOSIS — M545 Low back pain: Secondary | ICD-10-CM

## 2018-06-26 MED ORDER — NITROFURANTOIN MONOHYD MACRO 100 MG PO CAPS: 100.0000 mg | ORAL_CAPSULE | Freq: Two times a day (BID) | ORAL | 0 refills | Status: DC

## 2018-06-26 NOTE — Progress Notes (Signed)
Brandi Hanson  MRN: [id number] DOB: [birth year]  Subjective:  HPI   The patient is an 82 year old female who presents for evaluation of back pain.  She can not tell me when it started or if something specific caused it.  She states it is in the right side of her back and she describes it as feeling like it is in the bone.  She states that it feels worse when she goes to get up and it makes getting up and down hard. When I ask her today she cannot describe her discomfort/pain. Her sister brings her in and gives much of the history. She is not showing any behavioral issues despite progressive memory issues. Patient Active Problem List   Diagnosis Date Noted  . Aortic atherosclerosis ([place]) 01/03/2017  . Renal calculi 01/03/2017  . Allergic rhinitis 07/07/2015  . MCI (mild cognitive impairment) with memory loss 03/25/2015  . Depression, major, recurrent, mild ([place]) 03/25/2015  . Generalized anxiety disorder 03/25/2015  . IBS (irritable bowel syndrome) 02/15/2015  . At risk for falling 01/08/2015  . Body mass index (BMI) of 23.0-23.9 in adult 01/08/2015  . H/O malignant neoplasm of breast 01/08/2015  . Chronic constipation 01/08/2015  . Breast cancer of upper-outer quadrant of left female breast ([place]) 01/08/2015  . HZV (herpes zoster virus) post herpetic neuralgia 01/08/2015  . Apnea, sleep 01/08/2015  . Palpitations 08/21/2013  . Benign essential HTN 01/02/2012  . Beat, premature ventricular 04/21/2009  . Absolute anemia 02/05/2009  . Acid reflux 02/05/2009  . Essential (primary) hypertension 02/05/2009  . Bone/cartilage disorder 07/22/2007  . Avitaminosis D 10/08/2006  . Carotid artery obstruction 09/11/1998    Past Medical History:  Diagnosis Date  . Abdominal distention   . Abdominal pain   . Anxiety   . Arthritis   . Benign essential HTN 01/02/2012  . Breast cancer in situ 07/22/2011  . Cancer ([place])    skin  . Confusion   . Depression   . Generalized headaches   .  GERD (gastroesophageal reflux disease)   . Hyperlipidemia   . Hyperlipidemia 01/02/2012  . Hypertension   . Memory loss   . Osteoporosis   . Palpitations   . PVC (premature ventricular contraction)   . Rectal pain   . Sleep apnea     Social History   Socioeconomic History  . Marital status: Single    Spouse name: Not on file  . Number of children: Not on file  . Years of education: Not on file  . Highest education level: Not on file  Occupational History  . Not on file  Social Needs  . Financial resource strain: Not on file  . Food insecurity:    Worry: Not on file    Inability: Not on file  . Transportation needs:    Medical: Not on file    Non-medical: Not on file  Tobacco Use  . Smoking status: Never Smoker  . Smokeless tobacco: Never Used  Substance and Sexual Activity  . Alcohol use: No  . Drug use: No  . Sexual activity: Never  Lifestyle  . Physical activity:    Days per week: Not on file    Minutes per session: Not on file  . Stress: Not on file  Relationships  . Social connections:    Talks on phone: Not on file    Gets together: Not on file    Attends religious service: Not on file    Active member of  club or organization: Not on file    Attends meetings of clubs or organizations: Not on file    Relationship status: Not on file  . Intimate partner violence:    Fear of current or ex partner: Not on file    Emotionally abused: Not on file    Physically abused: Not on file    Forced sexual activity: Not on file  Other Topics Concern  . Not on file  Social History Narrative  . Not on file    Outpatient Encounter Medications as of 06/26/2018  Medication Sig Note  . alendronate (FOSAMAX) 70 MG tablet TAKE 1 TABLET BY MOUTH EVERY 7 DAYS. TAKE WITH A FULL GLASS OF WATER ON AN EMPTY STOMACH   . calcium carbonate (TUMS - DOSED IN MG ELEMENTAL CALCIUM) 500 MG chewable tablet Chew 1 tablet by mouth. PRN 01/08/2015: Received from: [employer]  . clidinium-chlordiazePOXIDE (LIBRAX) 5-2.5 MG capsule TAKE 1 OR 2 CAPSULES BY MOUTH 3 TIMES A DAY AS NEEDED   . donepezil (ARICEPT) 10 MG tablet TAKE 1 TABLET BY MOUTH AT BEDTIME   . fluticasone (FLONASE) 50 MCG/ACT nasal spray Place into both nostrils daily.   Marland Kitchen loratadine (CLARITIN) 10 MG tablet Take 10 mg by mouth daily.   Marland Kitchen LORazepam (ATIVAN) 0.5 MG tablet Take 0.5 mg by mouth every 8 (eight) hours.   . memantine (NAMENDA XR) 28 MG CP24 24 hr capsule Take 1 capsule (28 mg total) by mouth daily.   . propranolol (INDERAL) 20 MG tablet Take 1 tablet (20 mg total) by mouth 2 (two) times daily.   . sertraline (ZOLOFT) 100 MG tablet Take 1 tablet (100 mg total) by mouth daily.   Marland Kitchen acetaminophen (TYLENOL) 500 MG tablet Take 500 mg by mouth every 6 (six) hours as needed.   . Cholecalciferol (VITAMIN D3) 1000 UNITS CAPS Take 2 capsules by mouth daily. 01/08/2015: Received from: [employer]  . Propylene Glycol (SYSTANE COMPLETE OP) Apply to eye.    No facility-administered encounter medications on file as of 06/26/2018.     Allergies  Allergen Reactions  . Sulfa Antibiotics Nausea Only    Review of Systems  Constitutional: Negative for fever and malaise/fatigue.  HENT: Positive for congestion.   Eyes: Negative.   Respiratory: Positive for cough. Negative for shortness of breath and wheezing.   Cardiovascular: Negative for chest pain and palpitations.  Gastrointestinal: Negative.   Genitourinary: Negative for dysuria and hematuria.  Musculoskeletal: Positive for back pain and falls (not since her last visit with [place]).  Skin: Negative.   Neurological:       Progressive memory loss.  Endo/Heme/Allergies: Negative.   Psychiatric/Behavioral: Negative.     Objective:  BP 129/73 (BP Location: Right Arm, Patient Position: Sitting, Cuff Size: Normal)   Pulse 78   Temp 97.6 F (36.4 C) (Oral)   Resp 16   Wt 121 lb (54.9 kg)   BMI 20.14 kg/m   Physical Exam    Constitutional: She is oriented to person, place, and time and well-developed, well-nourished, and in no distress.  HENT:  Head: Normocephalic and atraumatic.  Eyes: Conjunctivae are normal. No scleral icterus.  Neck: No thyromegaly present.  Cardiovascular: Normal rate, regular rhythm and normal heart sounds.  Pulmonary/Chest: Effort normal and breath sounds normal.  Abdominal: Soft.  Musculoskeletal: She exhibits no edema.  No tenderness of spine or paraspinal tenderness.  Neurological: She is alert and oriented to person, place, and time. Gait normal. GCS score  is 15.  Skin: Skin is warm and dry.  Psychiatric: Mood and affect normal.    Assessment and Plan :   1. Need for influenza vaccination  - Flu vaccine HIGH DOSE PF (Fluzone High dose)  2. Urinary tract infection with hematuria, site unspecified  - nitrofurantoin, macrocrystal-monohydrate, (MACROBID) 100 MG capsule; Take 1 capsule (100 mg total) by mouth 2 (two) times daily.  Dispense: 14 capsule; Refill: 0 - Urine Culture  3. Aortic atherosclerosis ([place])   4. Malignant neoplasm of upper-outer quadrant of left breast in female, estrogen receptor positive ([place])   5. Late onset Alzheimer's disease without behavioral disturbance ([place])   6. Chronic right-sided low back pain without sciatica Appears to be MSK pain.Try tylenol prn.  I have done the exam and reviewed the chart and it is accurate to the best of my knowledge. Development worker, community has been used and  any errors in dictation or transcription are unintentional. Miguel Aschoff M.D. [facility]

## 2018-06-28 LAB — URINE CULTURE: ORGANISM ID, BACTERIA: NO GROWTH

## 2018-07-02 ENCOUNTER — Telehealth: Payer: Self-pay

## 2018-07-02 NOTE — Telephone Encounter (Signed)
Pt's sister advised.  (Per DPR)  Thanks,   -Mickel Baas

## 2018-07-02 NOTE — Telephone Encounter (Signed)
-----   Message from Jerrol Banana., MD sent at 06/28/2018  1:13 PM EDT ----- No infection found.

## 2018-09-18 ENCOUNTER — Ambulatory Visit: Payer: Medicare Other | Admitting: Family Medicine

## 2018-09-18 ENCOUNTER — Encounter: Payer: Self-pay | Admitting: Family Medicine

## 2018-09-18 VITALS — BP 145/80 | HR 73 | Temp 97.5°F | Wt 122.0 lb

## 2018-09-18 DIAGNOSIS — I700 Atherosclerosis of aorta: Secondary | ICD-10-CM

## 2018-09-18 DIAGNOSIS — M159 Polyosteoarthritis, unspecified: Secondary | ICD-10-CM

## 2018-09-18 DIAGNOSIS — C50412 Malignant neoplasm of upper-outer quadrant of left female breast: Secondary | ICD-10-CM

## 2018-09-18 DIAGNOSIS — M818 Other osteoporosis without current pathological fracture: Secondary | ICD-10-CM

## 2018-09-18 DIAGNOSIS — Z170 Estrogen receptor positive status [ER+]: Secondary | ICD-10-CM

## 2018-09-18 DIAGNOSIS — G301 Alzheimer's disease with late onset: Secondary | ICD-10-CM

## 2018-09-18 DIAGNOSIS — G4737 Central sleep apnea in conditions classified elsewhere: Secondary | ICD-10-CM | POA: Diagnosis not present

## 2018-09-18 DIAGNOSIS — I10 Essential (primary) hypertension: Secondary | ICD-10-CM | POA: Diagnosis not present

## 2018-09-18 DIAGNOSIS — M150 Primary generalized (osteo)arthritis: Secondary | ICD-10-CM

## 2018-09-18 DIAGNOSIS — F0280 Dementia in other diseases classified elsewhere without behavioral disturbance: Secondary | ICD-10-CM

## 2018-09-18 NOTE — Progress Notes (Addendum)
Patient: Brandi Hanson Female    DOB: [birth year]   83 y.o.   MRN: [id number] Visit Date: 09/18/2018  Today's Provider: Wilhemena Durie, MD   No chief complaint on file.  Subjective:    HPI Patient presents today for 6 month follow-up for late onset Alzheimer disease without behavioral disturbance. Patient has moved into [place] in July. Patient was last seen on 03/13/2018.  Overall she is doing pretty well.  Memory disturbance is progressive.  Sister replaces speech therapy but she has no real speech problems, she just cannot recall words. She does complain of knee pain.  Allergies  Allergen Reactions  . Sulfa Antibiotics Nausea Only     Current Outpatient Medications:  .  acetaminophen (TYLENOL) 500 MG tablet, Take 500 mg by mouth every 6 (six) hours as needed., Disp: , Rfl:  .  alendronate (FOSAMAX) 70 MG tablet, TAKE 1 TABLET BY MOUTH EVERY 7 DAYS. TAKE WITH A FULL GLASS OF WATER ON AN EMPTY STOMACH, Disp: 4 tablet, Rfl: 12 .  calcium carbonate (TUMS - DOSED IN MG ELEMENTAL CALCIUM) 500 MG chewable tablet, Chew 1 tablet by mouth. PRN, Disp: , Rfl:  .  Cholecalciferol (VITAMIN D3) 1000 UNITS CAPS, Take 2 capsules by mouth daily., Disp: , Rfl:  .  clidinium-chlordiazePOXIDE (LIBRAX) 5-2.5 MG capsule, TAKE 1 OR 2 CAPSULES BY MOUTH 3 TIMES A DAY AS NEEDED, Disp: 540 capsule, Rfl: 1 .  donepezil (ARICEPT) 10 MG tablet, TAKE 1 TABLET BY MOUTH AT BEDTIME, Disp: 90 tablet, Rfl: 3 .  fluticasone (FLONASE) 50 MCG/ACT nasal spray, Place into both nostrils daily., Disp: , Rfl:  .  LORazepam (ATIVAN) 0.5 MG tablet, Take 0.5 mg by mouth every 8 (eight) hours., Disp: , Rfl:  .  memantine (NAMENDA XR) 28 MG CP24 24 hr capsule, Take 1 capsule (28 mg total) by mouth daily., Disp: 90 capsule, Rfl: 3 .  propranolol (INDERAL) 20 MG tablet, Take 1 tablet (20 mg total) by mouth 2 (two) times daily., Disp: 360 tablet, Rfl: 4 .  Propylene Glycol (SYSTANE COMPLETE OP), Apply to eye., Disp: ,  Rfl:  .  sertraline (ZOLOFT) 100 MG tablet, Take 1 tablet (100 mg total) by mouth daily., Disp: 30 tablet, Rfl: 12 .  loratadine (CLARITIN) 10 MG tablet, Take 10 mg by mouth daily., Disp: , Rfl:  .  nitrofurantoin, macrocrystal-monohydrate, (MACROBID) 100 MG capsule, Take 1 capsule (100 mg total) by mouth 2 (two) times daily. (Patient not taking: Reported on 09/18/2018), Disp: 14 capsule, Rfl: 0  Review of Systems  Constitutional: Negative.   HENT: Negative.   Eyes: Negative.   Respiratory: Negative.   Cardiovascular: Negative.   Gastrointestinal: Negative.   Endocrine: Negative.   Allergic/Immunologic: Negative.   Neurological: Negative.   Psychiatric/Behavioral: Positive for confusion.    Social History   Tobacco Use  . Smoking status: Never Smoker  . Smokeless tobacco: Never Used  Substance Use Topics  . Alcohol use: No      Objective:   BP (!) 145/80 (BP Location: Left Arm, Patient Position: Sitting, Cuff Size: Normal)   Pulse 73   Temp (!) 97.5 F (36.4 C) (Oral)   Wt 122 lb (55.3 kg)   SpO2 99%   BMI 20.30 kg/m  Vitals:   09/18/18 1113  BP: (!) 145/80  Pulse: 73  Temp: (!) 97.5 F (36.4 C)  TempSrc: Oral  SpO2: 99%  Weight: 122 lb (55.3 kg)  Physical Exam Constitutional:      Appearance: She is well-developed.  Eyes:     Conjunctiva/sclera: Conjunctivae normal.     Pupils: Pupils are equal, round, and reactive to light.  Neck:     Musculoskeletal: Normal range of motion and neck supple.  Cardiovascular:     Rate and Rhythm: Normal rate and regular rhythm.     Heart sounds: Normal heart sounds.  Pulmonary:     Effort: Pulmonary effort is normal.     Breath sounds: Normal breath sounds.  Abdominal:     General: Bowel sounds are normal.     Palpations: Abdomen is soft.  Musculoskeletal: Normal range of motion.  Skin:    General: Skin is warm and dry.  Neurological:     Mental Status: She is alert and oriented to person, place, and time.          Assessment & Plan    1. Other osteoporosis without current pathological fracture Problem will biggest risk the patient is fall and fracture. Refer to dr French Ana. - Ambulatory referral to Orthopedic Surgery  2. Essential (primary) hypertension   3. Aortic atherosclerosis ([place])   4. Central sleep apnea due to medical condition   5. Malignant neoplasm of upper-outer quadrant of left breast in female, estrogen receptor positive ([place]) History of breast cancer.  6. Late onset Alzheimer's disease without behavioral disturbance ([facility]) Progressive.  Follow-up in a few months. 7.OA of Knees Refer to Ortho.   I have done the exam and reviewed the above chart and it is accurate to the best of my knowledge. Development worker, community has been used in this note in any air is in the dictation or transcription are unintentional.  Wilhemena Durie, MD  [place]

## 2018-09-18 NOTE — Patient Instructions (Signed)
Stop taking Alendronate 70 mg. Started taking Donepezil 5 mg po daily and Namenda XR 28 mg po daily.

## 2018-11-18 ENCOUNTER — Ambulatory Visit: Payer: Medicare Other | Admitting: Neurology

## 2018-11-18 ENCOUNTER — Encounter: Payer: Self-pay | Admitting: Neurology

## 2018-11-18 VITALS — BP 142/75 | HR 72 | Wt 121.8 lb

## 2018-11-18 DIAGNOSIS — F0280 Dementia in other diseases classified elsewhere without behavioral disturbance: Secondary | ICD-10-CM | POA: Diagnosis not present

## 2018-11-18 DIAGNOSIS — G301 Alzheimer's disease with late onset: Secondary | ICD-10-CM | POA: Diagnosis not present

## 2018-11-18 NOTE — Patient Instructions (Signed)
I had a long discussion the patient and her sister regarding her Alzheimer's dementia which appears to be stable on the current medication regimen. Continue Aricept 10 mg daily and Namenda XR 28 mg daily. I encouraged the patient to increase participation in cognitively challenging activities like solving crossword puzzles, playing bridge and sudoku.  She may consider possible participation in upcoming new dementia studies if interested.  Follow-up in the future in 6 months or call earlier if necessary

## 2018-11-18 NOTE — Progress Notes (Signed)
[facility] [address] [place]. [place] [address] [phone]       OFFICE FOLLOW UP NOTE  Ms. Lidia Clavijo [facility] Date of Birth:  1935/12/01 Medical Record Number:  [id number]   Referring MD:  Gaspar Garbe  Reason for Referral:  Memory loss  HPI: 89 year Caucasian lady  having mild progressive memory and cognitive difficulties for last 2 years which have been mildly progressive.She forgets  recent  Events, tasks and at times struggles to find words and finish sentences stopping in midsentence or occcasionally even stuttering..She misplaces things.at times she has been noticed as staring blankly into space and transiently unaware of her surroundings.She has longstanding h/o anxiety and has been on Librax 2.5 mg twice daily  For years and Inderal LA 60 mg has been added recently.She is yet independent in her ADLs and lives alone. There is no h/o headache, seizure, TIA, stroke, significant head injury with loss of consciousness.She has not had any brain imaging or lab work for causes of memory loss or detailed neuropsych testing. Update 08/12/13 : She returns for followup today and states that she did not start taking the fish oil as I had instructed and took only  the samples of Cerefolin I gave her and when she ran out she did not fill the prescription. She states her memory difficulties the about the same and anxiety seems to be increased. She did undergo the tests which I had ordered however. Vitamin B12, TSH and RPR on 06/09/13 were all normal. EEG done and 06/19/13 was normal without any epileptiform features. MRI scan of the brain on 06/20/13 showed mild changes of chronic microvascular ischemia and generalized cerebral atrophy. She admits that she is quite anxious and she has been taking Lipper ask and Inderal for a long time and has seen only her primary physician and perhaps may benefit by referral to a psychiatrist. She did not keep the appointment which we had made  [facility] as she prefers Dr Valentina Shaggy in [place] which is shorter drive for her. Update 02/04/2015 : She returns for follow-up today after last visit in December 2014. She is accompanied by sister. She continues to have cognitive difficulties mainly with short-term memory, multitasking, concentration. These subjectively seems to be worse but on objective testing today she actually did not score significantly less than last visit. She however did undergo detailed neuropsych testing on 10/22/14 by Dr. 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 Compared with similar testing by her in 2015 there appeared to be slight worsening. She was started on Aricept 5 mg daily 3 months ago by her primary physician she is tolerating it well without any GI or CNS side effects but the patient feels she is not subjectively any better. She was found to have difficulty with complex visual processing and advised to not drive or limit her driving. The patient informs me that she's not had any problems with driving and on my neurological exam she has full fields of vision and no major neurological deficits. She saw a psychiatrist in [place] but was not happy with him as he did not change her medications. Update 04/15/2015 : She returns for follow-up after last visit 2 months ago a complaint by sister and nephew. She continues to have memory difficulties as well as periods of transient confusion and disorientation off and on. She has now been on Aricept 10 mg for  the last 2 months and seems to be tolerating it reasonably well without significant GI side effects or dizziness. She has been driving without major problems but restricted to short distances. She does have difficulty with remembering directions. She has not had any delusions, hallucinations, falls, balance problems or safety concerns. On Mini-Mental status exam  today she scored 25 which is actually a drop from 29 from last visit. Update 06/23/15 : She returns for follow-up to last visit 2 months ago. She is a compared by her daughter and son. They have noticed some improvement in her memory and confusion. He was not able to tolerate Namenda when she increase the dose to 10 mg twice daily and hence went down to 5 mg twice daily on 05/18/15 which is tolerating much better. She in fact scored 29/30 on the Mini-Mental which is improved from 25/30 at last visit. She still has some good days and bad days. But she is more interactive and can produce patent conversations better. She denies significant nausea, vomiting, diarrhea or dizziness. There have been no major safety of issues. She has some mild impaired balance but has not had major injury or fall. She does not have significant delusions or hallucinations but does occasionally get agitated but can be redirected. She continues to live alone and her daughter lives across the street and has close supervision. She is driving to a limited extent and there have been no accidents. Update 01/21/2016 : She returns for follow-up after last visit 6 months ago. She is accompanied by her daughter and grandson. She continues to do resumed it well. She still has problems with numbers and names and calculations. She still lives alone. Family provides some supervision but mostly she is independent. She has own social circles and does go out with large groups. She does also go to the [facility] 3 days a week for exercise. She remains a bit and likes to do word gambles but does not do another cognitively challenging activities. She walks pretty good and there have been no safety concerns identified. She's not had any recent falls. She has not had any delusions or hallucinations. She does have significant anxiety and does get upset from time to time. She has a prescription for Librax to be taken 3 times a day but she usually takes it not more than  twice. The daughter feels that by late afternoon she is very anxious and may benefit by taking an additional dose at lunchtime She still driving very limited mostly during daytime to family or crowds. Update 07/31/2016 : She returns for follow-up after last visit 6 months ago. She is accompanied by her sister and nephew. Patient continues to have mild cognitive difficulties with short-term memory, forgetting names. At times unable to complete sentences. She also has anxiety. The family feels at times when several people are talking she has trouble focusing and paying attention and can zone out. She gets quite frustrated when there is conversation in a crowd. She still has trouble remembering dates and appointments. Family feels she may also be losing some interest in visiting places and going out of the house. She still continues to drive and has gotten lost a couple of occasions. The sister makes every attempt to be with the patient when she drives. The patient has recently gotten new primary care physician. On Mini-Mental status exam today she scored 30/30 which is unchanged from last visit. She remains on Aricept and Namenda which is tolerating well without side effects. Update  05/21/2017 : She returns for follow-up after last visit 3 months ago. She is accompanied by sister and son. Patient had some initial trouble tolerating the increased dose of Namenda 10 mg twice daily with some hallucinations but she's done better now in last few months. She however cognitively is about the same. She is having some word finding difficulties and does her physical errors and uses wrong word substitution. She has had not any major issues with agitation not unsafe behavior of falls. She does get some hallucinations at times. The patient's family has now decided to consider participation in the TRAILBLAZER dementia trial. Update 11/20/2017 : Patient is seen for follow-up after last visit 6 months ago. She is accompanied by her  son and sister who states that they have noticed cognitive worsening since Christmastime. Patient gets confused easily. She is often disoriented. She needs more help, sister who lives right across. Sister helps her with medications. Patient has difficult time answering phones correctly for telling time. She is stop cocaine. She often last same questions repeatedly. There have been no changes in her behavior, agitation, delusions or hallucinations noted. Patient has decided to participate in the Trailblazer trial. She is currently on Aricept 10 mg daily and Namenda 10 mg twice daily. She has been reluctant to increase the dose of the medications due to possibility of side effects. Update 02/11/2018 : She returns for follow-up after last visit nearly 3 months ago.  She is accompanied by her sister and nephew.  They both report that they have noticed gradual progressive cognitive worsening with the patient.  She gets confused quite easily.  She mixes up her days and nights.  For example she was sending out cards in June for but dates in February.  She is also had some balance and difficulties and fell once while getting out of bed.  She has occasional hallucinations as well.  She often wakes him disoriented.  She is still living alone and her sister lives across the street and checks on her frequently.  She has a caregiver who works 4 hours 2 days a week with her.  Patient remains on Aricept 10 mg daily and Namenda 10 mg twice daily.  She did try out for the Trailblazer dementia study but was a screen failure as she had excessive tau accumulation on the study screening PET scan.  She had lab work done for reversible causes of cognitive impairment at last visit which was unremarkable.  MRI scan of the brain was not done.  She remains a pleasant disposition and can be redirected easily when she is confused or disoriented.  There has been no agitation or violent behaviors noted.  She does not wander off. Update 05/20/2018.  :she returns for follow-up last visit 3 months ago. She is accompanied by her sister. They both report that patient appears to be about the same. She is cognitively stable. She is since now moved to living in the assisted living. All her medications supervising given to her. She remains I septum milligrams daily and Namenda XR 28 mg daily which is tolerating well without side effects. She's not had any new health issues.The patient does get disoriented easily. She cannot follow her activity calendar and menu even though she writes it down. She did undergo MRI scan of the brain on 02/28/18 which are personally reviewed and shows only mild generalized atrophy and changes of small vessel disease. She has no new complaints today. Update 11/18/2018 : She returns for follow-up after  last visit 6 months ago.  She is accompanied by her sister and nephew.  Patient is living in assisted living.  She continues to have mild cognitive difficulties.  She gets agitated and flustered and anxious occasionally.  She does take Ativan twice daily which seems to calm her down.  She gets confused occasionally and has had very few hallucinations.  There is no significant violent behavior or delusions.  She uses a cane to walk and is careful and has had no falls or injuries.  She remains on Aricept 10 mg as well as Namenda XR 28 mg daily.  She does interact socially but has not been as physically active.  She has no new complaints. ROS:   14 system review of systems is positive for hearing loss, ringing in the ears, runny nose, palpitations, constipation, diarrhea, daytime sleepiness, allergies, bladder urgency, walking difficulty, itching, memory loss, headache, speech difficulty, weakness, depression, agitation, nervousness, anxiety, hallucinations and all other systems negative   PMH:  Past Medical History:  Diagnosis Date  . Abdominal distention   . Abdominal pain   . Anxiety   . Arthritis   . Benign essential HTN 01/02/2012  .  Breast cancer in situ 07/22/2011  . Cancer ([place])    skin  . Confusion   . Depression   . Generalized headaches   . GERD (gastroesophageal reflux disease)   . Hyperlipidemia   . Hyperlipidemia 01/02/2012  . Hypertension   . Memory loss   . Osteoporosis   . Palpitations   . PVC (premature ventricular contraction)   . Rectal pain   . Sleep apnea     Social History:  Social History   Socioeconomic History  . Marital status: Single    Spouse name: Not on file  . Number of children: Not on file  . Years of education: Not on file  . Highest education level: Not on file  Occupational History  . Not on file  Social Needs  . Financial resource strain: Not on file  . Food insecurity:    Worry: Not on file    Inability: Not on file  . Transportation needs:    Medical: Not on file    Non-medical: Not on file  Tobacco Use  . Smoking status: Never Smoker  . Smokeless tobacco: Never Used  Substance and Sexual Activity  . Alcohol use: No  . Drug use: No  . Sexual activity: Never  Lifestyle  . Physical activity:    Days per week: Not on file    Minutes per session: Not on file  . Stress: Not on file  Relationships  . Social connections:    Talks on phone: Not on file    Gets together: Not on file    Attends religious service: Not on file    Active member of club or organization: Not on file    Attends meetings of clubs or organizations: Not on file    Relationship status: Not on file  . Intimate partner violence:    Fear of current or ex partner: Not on file    Emotionally abused: Not on file    Physically abused: Not on file    Forced sexual activity: Not on file  Other Topics Concern  . Not on file  Social History Narrative  . Not on file    Medications:   Current Outpatient Medications on File Prior to Visit  Medication Sig Dispense Refill  . acetaminophen (TYLENOL) 500 MG tablet Take 500  mg by mouth every 6 (six) hours as needed.    Marland Kitchen alendronate (FOSAMAX) 70  MG tablet TAKE 1 TABLET BY MOUTH EVERY 7 DAYS. TAKE WITH A FULL GLASS OF WATER ON AN EMPTY STOMACH 4 tablet 12  . calcium carbonate (TUMS - DOSED IN MG ELEMENTAL CALCIUM) 500 MG chewable tablet Chew 1 tablet by mouth. PRN    . Cholecalciferol (VITAMIN D3) 1000 UNITS CAPS Take 2 capsules by mouth daily.    . clidinium-chlordiazePOXIDE (LIBRAX) 5-2.5 MG capsule TAKE 1 OR 2 CAPSULES BY MOUTH 3 TIMES A DAY AS NEEDED 540 capsule 1  . donepezil (ARICEPT) 10 MG tablet TAKE 1 TABLET BY MOUTH AT BEDTIME 90 tablet 3  . fluticasone (FLONASE) 50 MCG/ACT nasal spray Place into both nostrils daily.    Marland Kitchen loratadine (CLARITIN) 10 MG tablet Take 10 mg by mouth daily.    Marland Kitchen LORazepam (ATIVAN) 0.5 MG tablet Take 0.5 mg by mouth every 8 (eight) hours.    . memantine (NAMENDA XR) 28 MG CP24 24 hr capsule Take 1 capsule (28 mg total) by mouth daily. 90 capsule 3  . nitrofurantoin, macrocrystal-monohydrate, (MACROBID) 100 MG capsule Take 1 capsule (100 mg total) by mouth 2 (two) times daily. 14 capsule 0  . propranolol (INDERAL) 20 MG tablet Take 1 tablet (20 mg total) by mouth 2 (two) times daily. 360 tablet 4  . Propylene Glycol (SYSTANE COMPLETE OP) Apply to eye.    . sertraline (ZOLOFT) 100 MG tablet Take 1 tablet (100 mg total) by mouth daily. 30 tablet 12   No current facility-administered medications on file prior to visit.     Allergies:   Allergies  Allergen Reactions  . Sulfa Antibiotics Nausea Only    Physical Exam General: frail elderly Caucasian lady, seated, in no evident distress Head: head normocephalic and atraumatic.   Neck: supple with no carotid or supraclavicular bruits Cardiovascular: regular rate and rhythm, no murmurs Musculoskeletal: no deformity Skin:  no rash/petichiae Vascular:  Normal pulses all extremities Vitals:   11/18/18 1129  BP: (!) 142/75  Pulse: 72    Neurologic Exam Mental Status: Awake and fully alert. Oriented to place and time. Recent and remote memory intact.  Attention span, concentration and fund of knowledge appropriate. Mood and affect appropriate. MMSE 20/30( 21/30 from last visit 05/20/18 ) with deficits in orientation, attention and calculation. Able to name 5 animals with full legs.. Clock drawing 1/4.  Cranial Nerves: Fundoscopic exam not done.  Pupils equal, briskly reactive to light. Extraocular movements full without nystagmus. Visual fields full to confrontation. Hearing intact. Facial sensation intact. Face, tongue, palate moves normally and symmetrically.  Motor: Normal bulk and tone. Normal strength in all tested extremity muscles. Sensory.: intact to tough and pinprick and vibratory.  Coordination: Rapid alternating movements normal in all extremities. Finger-to-nose and heel-to-shin performed accurately bilaterally. Gait and Station: Arises from chair without difficulty. Stance is normal. Gait demonstrates normal stride length and balance . Able to heel, toe and tandem walk with slight difficulty.  Reflexes: 1+ and symmetric. Toes downgoing.      ASSESSMENT: 33 year lady with progressive mild memory difficulties  X 3 years-likely  Mild Alzheimer`s dementia due to  significant progression over last 2 years and some response to Aricept and Namenda    PLAN: I had a long discussion the patient and her sister regarding her Alzheimer's dementia which appears to be stable on the current medication regimen. Continue Aricept 10 mg daily and Namenda XR 28  mg daily. I encouraged the patient to increase participation in cognitively challenging activities like solving crossword puzzles, playing bridge and sudoku.  She may consider possible participation in upcoming new dementia studies if interested.  Follow-up in the future in 6 months or call earlier if necessary Greater than 50% time during this 25 minute visit was spent on counseling and coordination of care about her dementia.   Antony Contras, MD

## 2019-01-29 ENCOUNTER — Telehealth: Payer: Self-pay

## 2019-01-29 DIAGNOSIS — R300 Dysuria: Secondary | ICD-10-CM

## 2019-01-29 DIAGNOSIS — R410 Disorientation, unspecified: Secondary | ICD-10-CM

## 2019-01-29 NOTE — Telephone Encounter (Signed)
Evette from [facility] called about patient.  States she is more confused, would like to get order for UA and culture No other symptoms   CB# [phone] Fax  [phone]

## 2019-01-29 NOTE — Telephone Encounter (Signed)
The caregiver advised that they need an order written and faxed to them at the number below in first message.  They advised they use [facility].  dbs

## 2019-01-29 NOTE — Telephone Encounter (Signed)
Orders has been faxed.

## 2019-01-29 NOTE — Telephone Encounter (Signed)
This is a Surveyor, quantity patient. This message was routed to [employer], but Caryn Section is not here this pm. Will you review this message?

## 2019-01-29 NOTE — Telephone Encounter (Signed)
OK to give verbal order for UA and culture.

## 2019-01-29 NOTE — Telephone Encounter (Signed)
Orders placed.

## 2019-01-30 ENCOUNTER — Telehealth: Payer: Self-pay

## 2019-01-30 ENCOUNTER — Other Ambulatory Visit: Payer: Self-pay | Admitting: Physician Assistant

## 2019-01-30 DIAGNOSIS — N3090 Cystitis, unspecified without hematuria: Secondary | ICD-10-CM

## 2019-01-30 LAB — URINALYSIS
Bilirubin, UA: NEGATIVE
Glucose, UA: NEGATIVE
Nitrite, UA: NEGATIVE
Protein,UA: NEGATIVE
Specific Gravity, UA: 1.023 (ref 1.005–1.030)
Urobilinogen, Ur: 0.2 mg/dL (ref 0.2–1.0)
pH, UA: 5.5 (ref 5.0–7.5)

## 2019-01-30 MED ORDER — CEPHALEXIN 500 MG PO CAPS: 500.0000 mg | ORAL_CAPSULE | Freq: Two times a day (BID) | ORAL | 0 refills | Status: AC

## 2019-01-30 NOTE — Telephone Encounter (Signed)
-----   Message from Trinna Post, [place] sent at 01/30/2019  9:39 AM EDT ----- UA came back with some white blood cells and red blood cells. Is she still having confusion? I believe this patient lives in a facility. I can send her in an antibiotic to treat for possible UTI if they will tell me the pharmacy it should go to and if it needs to be faxed.

## 2019-01-30 NOTE — Telephone Encounter (Signed)
Domenick Bookbinder returned call today through [employer].   She can be reached at [phone] or [phone] to discuss pt's lab results.  Thanks, [employer]

## 2019-01-30 NOTE — Telephone Encounter (Signed)
Left message to call back  

## 2019-01-30 NOTE — Telephone Encounter (Signed)
Sister has been advised, she states that  Rx can be sent to [facility] or to [name], will call [name] to confirm. KW

## 2019-01-30 NOTE — Progress Notes (Signed)
Sent keflex 500 mg BID x 7 days to [facility] for possible cystitis.

## 2019-02-01 LAB — CULTURE, URINE COMPREHENSIVE

## 2019-02-12 ENCOUNTER — Telehealth: Payer: Self-pay

## 2019-02-12 NOTE — Telephone Encounter (Signed)
Ms. Atilano Median ([facility] POA) called and asked if we could send [name] an order so they can re-collect a urine specimen as the last one was contaminated.  Apparently per Ms. Cheek the facility did not have personal wipes for the patient to clean prior to the collection of the last specimen.

## 2019-02-12 NOTE — Telephone Encounter (Signed)
Please review. Thanks!  

## 2019-02-13 NOTE — Telephone Encounter (Signed)
[name] is calling to check on the order.   Please advise.   Thanks,   -Mickel Baas

## 2019-02-13 NOTE — Telephone Encounter (Signed)
Ok

## 2019-02-14 ENCOUNTER — Telehealth: Payer: Self-pay | Admitting: Family Medicine

## 2019-02-14 ENCOUNTER — Other Ambulatory Visit: Payer: Self-pay | Admitting: Family Medicine

## 2019-02-14 MED ORDER — NITROFURANTOIN MONOHYD MACRO 100 MG PO CAPS: 100.0000 mg | ORAL_CAPSULE | Freq: Two times a day (BID) | ORAL | 0 refills | Status: AC

## 2019-02-14 NOTE — Telephone Encounter (Signed)
Nitrofurantoin 100mg  BID for 3 days.

## 2019-02-14 NOTE — Telephone Encounter (Signed)
Order faxed.

## 2019-02-14 NOTE — Telephone Encounter (Signed)
Medication was sent into the pharmacy. Brandi Hanson was advised, and she will let the patient's sister know.

## 2019-02-14 NOTE — Telephone Encounter (Signed)
Evette called from [facility] saying they never received an order for a urine specimen  Please fax an order to [phone]  Thanks teri

## 2019-02-14 NOTE — Telephone Encounter (Signed)
Please review. Thanks!  

## 2019-02-14 NOTE — Telephone Encounter (Signed)
Pt's  sister Brandi Hanson called saying her sister Brandi Hanson  has had a UTI in the past two weeks.  They suspect that it she still has one.   She is having some burning and darker urine.  [name] is getting a urine on her today but they told her it would be a couple of days before they got the results.   [name] is concerned and wants to know if Dr, Rosanna Randy would go ahead send an antibiotic to [name].    Thanks [employer]

## 2019-02-17 ENCOUNTER — Ambulatory Visit: Payer: Self-pay | Admitting: Family Medicine

## 2019-02-18 ENCOUNTER — Telehealth: Payer: Self-pay | Admitting: Family Medicine

## 2019-02-18 DIAGNOSIS — R300 Dysuria: Secondary | ICD-10-CM

## 2019-02-18 LAB — MICROSCOPIC EXAMINATION
Casts: NONE SEEN /lpf
RBC: >30 /hpf — AB (ref 0–2)
WBC, UA: >30 /hpf — AB (ref 0–5)

## 2019-02-18 LAB — URINALYSIS, ROUTINE W REFLEX MICROSCOPIC
Bilirubin, UA: NEGATIVE
Glucose, UA: NEGATIVE
Ketones, UA: NEGATIVE
Nitrite, UA: NEGATIVE
Specific Gravity, UA: 1.024 (ref 1.005–1.030)
Urobilinogen, Ur: 0.2 mg/dL (ref 0.2–1.0)
pH, UA: 5 (ref 5.0–7.5)

## 2019-02-18 LAB — URINE CULTURE

## 2019-02-18 NOTE — Telephone Encounter (Signed)
I have not seen any results yet. Have you? Please advise. Thanks!

## 2019-02-18 NOTE — Telephone Encounter (Signed)
[name] called wanting to know if we received the lab results from [facility]. [facility] yet.  They did the lab on Friday  CB#  [phone]  Thanks teri

## 2019-02-18 NOTE — Telephone Encounter (Signed)
Have not seen this.

## 2019-02-19 MED ORDER — LEVOFLOXACIN 250 MG PO TABS: 250.0000 mg | ORAL_TABLET | Freq: Every day | ORAL | 0 refills | Status: AC

## 2019-02-19 NOTE — Telephone Encounter (Signed)
-----   Message from Jerrol Banana., MD sent at 02/19/2019 11:30 AM EDT ----- Nitrofurantoin covers her UTI

## 2019-02-19 NOTE — Telephone Encounter (Signed)
Alycia Rossetti (patient's nephew) as below. He reports that the nurse at [facility] told him that the patient's symptoms are worse.   Per Dr. Rosanna Randy, will prescribe levofloxacin 250mg  daily X 5 days and if not improved, she will need to be evaluated in the office.   Alycia Rossetti and he verbally understands treatment plan.

## 2019-02-19 NOTE — Telephone Encounter (Signed)
Called number listed below, and phone kept ringing and ringing. Will try again later.

## 2019-02-24 ENCOUNTER — Telehealth: Payer: Self-pay

## 2019-02-24 NOTE — Telephone Encounter (Signed)
Does the patient need to be seen sooner? Please advise. Thanks!

## 2019-02-24 NOTE — Telephone Encounter (Signed)
Pending appointment would try nitrofurantoin 100 mg twice a day for 3 days.

## 2019-02-24 NOTE — Telephone Encounter (Signed)
Dewaine Oats reports that patient is still acting very confused. Patient does have alzheimers but reports "this is not her baseline" patient did take all 5 days of antibiotic. [place]. Dewaine Oats advised that Brandi Hanson needs to be seen. Next available appt 02/26/2019 at 4 pm.

## 2019-02-25 MED ORDER — NITROFURANTOIN MONOHYD MACRO 100 MG PO CAPS: 100.0000 mg | ORAL_CAPSULE | Freq: Two times a day (BID) | ORAL | 0 refills | Status: AC

## 2019-02-25 NOTE — Telephone Encounter (Signed)
Medication was sent into the pharmacy.  

## 2019-02-26 ENCOUNTER — Telehealth: Payer: Self-pay | Admitting: Family Medicine

## 2019-02-26 NOTE — Telephone Encounter (Signed)
Brandi Hanson wants to know if Dr. Rosanna Randy wants to order another UA or UC  Pt is still confused and it is not her baseline confusion.  CB#  [phone]  Thank [name]

## 2019-02-26 NOTE — Telephone Encounter (Signed)
Please advise 

## 2019-02-26 NOTE — Telephone Encounter (Signed)
Urine C and S. 

## 2019-02-28 NOTE — Telephone Encounter (Signed)
Patient's nephew called requesting the status on pt's urine culture. Marcello Moores would like a call back. Please advise?

## 2019-02-28 NOTE — Telephone Encounter (Signed)
Patient's nephew called back requesting status on pt's urine culture. Please advise?

## 2019-03-03 NOTE — Telephone Encounter (Signed)
Please call Gordy Clement back at [phone] asap for pt's urine culture results. Pt was expecting a call on Friday.  Needing to know results this morning if possible.  Thanks, [employer]

## 2019-03-03 NOTE — Telephone Encounter (Signed)
Verbal Order was not sent into [facility] until today. I advised Elta Guadeloupe of this. Spoke with Dewaine Oats at [facility], and she will get urine culture for the patient.

## 2019-03-04 ENCOUNTER — Other Ambulatory Visit: Payer: Self-pay

## 2019-03-04 DIAGNOSIS — N3090 Cystitis, unspecified without hematuria: Secondary | ICD-10-CM

## 2019-03-04 NOTE — Progress Notes (Signed)
Ok per Dr. Gilbert.  

## 2019-03-06 ENCOUNTER — Telehealth: Payer: Self-pay

## 2019-03-06 LAB — URINE CULTURE

## 2019-03-06 NOTE — Telephone Encounter (Signed)
L/M advising Gordy Clement (nephew) as below. If any questions he is to call the office.

## 2019-03-06 NOTE — Telephone Encounter (Signed)
-----   Message from Jerrol Banana., MD sent at 03/06/2019  7:54 AM EDT ----- No UTI

## 2019-05-21 ENCOUNTER — Ambulatory Visit: Payer: Medicare Other | Admitting: Neurology

## 2019-12-12 ENCOUNTER — Emergency Department: Payer: Medicare PPO

## 2019-12-12 ENCOUNTER — Encounter: Payer: Self-pay | Admitting: Emergency Medicine

## 2019-12-12 ENCOUNTER — Other Ambulatory Visit: Payer: Self-pay

## 2019-12-12 ENCOUNTER — Inpatient Hospital Stay
Admission: EM | Admit: 2019-12-12 | Discharge: 2019-12-16 | DRG: 871 | Disposition: A | Discharge status: Home / Self Care | Payer: Medicare PPO | Source: Skilled Nursing Facility | Attending: Internal Medicine | Admitting: Internal Medicine

## 2019-12-12 DIAGNOSIS — Z66 Do not resuscitate: Secondary | ICD-10-CM | POA: Diagnosis present

## 2019-12-12 DIAGNOSIS — F0390 Unspecified dementia without behavioral disturbance: Secondary | ICD-10-CM | POA: Diagnosis present

## 2019-12-12 DIAGNOSIS — G4730 Sleep apnea, unspecified: Secondary | ICD-10-CM | POA: Diagnosis present

## 2019-12-12 DIAGNOSIS — Z79899 Other long term (current) drug therapy: Secondary | ICD-10-CM

## 2019-12-12 DIAGNOSIS — R6520 Severe sepsis without septic shock: Secondary | ICD-10-CM

## 2019-12-12 DIAGNOSIS — Z853 Personal history of malignant neoplasm of breast: Secondary | ICD-10-CM | POA: Diagnosis not present

## 2019-12-12 DIAGNOSIS — I700 Atherosclerosis of aorta: Secondary | ICD-10-CM | POA: Diagnosis present

## 2019-12-12 DIAGNOSIS — Z882 Allergy status to sulfonamides status: Secondary | ICD-10-CM | POA: Diagnosis not present

## 2019-12-12 DIAGNOSIS — Z87442 Personal history of urinary calculi: Secondary | ICD-10-CM | POA: Diagnosis not present

## 2019-12-12 DIAGNOSIS — D638 Anemia in other chronic diseases classified elsewhere: Secondary | ICD-10-CM | POA: Diagnosis present

## 2019-12-12 DIAGNOSIS — D509 Iron deficiency anemia, unspecified: Secondary | ICD-10-CM | POA: Diagnosis not present

## 2019-12-12 DIAGNOSIS — I951 Orthostatic hypotension: Secondary | ICD-10-CM | POA: Diagnosis present

## 2019-12-12 DIAGNOSIS — R55 Syncope and collapse: Secondary | ICD-10-CM | POA: Diagnosis present

## 2019-12-12 DIAGNOSIS — G928 Other toxic encephalopathy: Secondary | ICD-10-CM | POA: Diagnosis present

## 2019-12-12 DIAGNOSIS — Z20822 Contact with and (suspected) exposure to covid-19: Secondary | ICD-10-CM | POA: Diagnosis present

## 2019-12-12 DIAGNOSIS — I10 Essential (primary) hypertension: Secondary | ICD-10-CM | POA: Diagnosis present

## 2019-12-12 DIAGNOSIS — B9689 Other specified bacterial agents as the cause of diseases classified elsewhere: Secondary | ICD-10-CM | POA: Diagnosis not present

## 2019-12-12 DIAGNOSIS — E860 Dehydration: Secondary | ICD-10-CM | POA: Diagnosis present

## 2019-12-12 DIAGNOSIS — R413 Other amnesia: Secondary | ICD-10-CM | POA: Diagnosis present

## 2019-12-12 DIAGNOSIS — G92 Toxic encephalopathy: Secondary | ICD-10-CM | POA: Diagnosis present

## 2019-12-12 DIAGNOSIS — K219 Gastro-esophageal reflux disease without esophagitis: Secondary | ICD-10-CM | POA: Diagnosis present

## 2019-12-12 DIAGNOSIS — A4159 Other Gram-negative sepsis: Secondary | ICD-10-CM | POA: Diagnosis present

## 2019-12-12 DIAGNOSIS — F330 Major depressive disorder, recurrent, mild: Secondary | ICD-10-CM | POA: Diagnosis present

## 2019-12-12 DIAGNOSIS — Z79890 Hormone replacement therapy (postmenopausal): Secondary | ICD-10-CM | POA: Diagnosis not present

## 2019-12-12 DIAGNOSIS — Z8249 Family history of ischemic heart disease and other diseases of the circulatory system: Secondary | ICD-10-CM

## 2019-12-12 DIAGNOSIS — N390 Urinary tract infection, site not specified: Secondary | ICD-10-CM | POA: Diagnosis present

## 2019-12-12 DIAGNOSIS — E785 Hyperlipidemia, unspecified: Secondary | ICD-10-CM | POA: Diagnosis present

## 2019-12-12 DIAGNOSIS — A419 Sepsis, unspecified organism: Secondary | ICD-10-CM | POA: Diagnosis not present

## 2019-12-12 DIAGNOSIS — M810 Age-related osteoporosis without current pathological fracture: Secondary | ICD-10-CM | POA: Diagnosis present

## 2019-12-12 DIAGNOSIS — Z85828 Personal history of other malignant neoplasm of skin: Secondary | ICD-10-CM

## 2019-12-12 DIAGNOSIS — Z803 Family history of malignant neoplasm of breast: Secondary | ICD-10-CM

## 2019-12-12 DIAGNOSIS — R7881 Bacteremia: Secondary | ICD-10-CM | POA: Diagnosis not present

## 2019-12-12 LAB — URINALYSIS, COMPLETE (UACMP) WITH MICROSCOPIC
Bilirubin Urine: NEGATIVE
Glucose, UA: NEGATIVE mg/dL
Hgb urine dipstick: NEGATIVE
Ketones, ur: NEGATIVE mg/dL
Nitrite: POSITIVE — AB
Protein, ur: 30 mg/dL — AB
Specific Gravity, Urine: 1.016 (ref 1.005–1.030)
pH: 6 (ref 5.0–8.0)

## 2019-12-12 LAB — TROPONIN I (HIGH SENSITIVITY)
Troponin I (High Sensitivity): 11 ng/L (ref <18)
Troponin I (High Sensitivity): 14 ng/L (ref <18)

## 2019-12-12 LAB — COMPREHENSIVE METABOLIC PANEL
ALT: 13 U/L (ref 0–44)
AST: 20 U/L (ref 15–41)
Albumin: 3.6 g/dL (ref 3.5–5.0)
Alkaline Phosphatase: 76 U/L (ref 38–126)
Anion gap: 11 (ref 5–15)
BUN: 25 mg/dL — ABNORMAL HIGH (ref 8–23)
CO2: 29 mmol/L (ref 22–32)
Calcium: 9.3 mg/dL (ref 8.9–10.3)
Chloride: 100 mmol/L (ref 98–111)
Creatinine, Ser: 0.79 mg/dL (ref 0.44–1.00)
GFR calc Af Amer: >60 mL/min (ref >60)
GFR calc non Af Amer: >60 mL/min (ref >60)
Glucose, Bld: 142 mg/dL — ABNORMAL HIGH (ref 70–99)
Potassium: 3.8 mmol/L (ref 3.5–5.1)
Sodium: 140 mmol/L (ref 135–145)
Total Bilirubin: 0.8 mg/dL (ref 0.3–1.2)
Total Protein: 6.7 g/dL (ref 6.5–8.1)

## 2019-12-12 LAB — CBC WITH DIFFERENTIAL/PLATELET
Abs Immature Granulocytes: 0.01 10*3/uL (ref 0.00–0.07)
Basophils Absolute: 0 10*3/uL (ref 0.0–0.1)
Basophils Relative: 0 %
Eosinophils Absolute: 0 10*3/uL (ref 0.0–0.5)
Eosinophils Relative: 0 %
HCT: 33.1 % — ABNORMAL LOW (ref 36.0–46.0)
Hemoglobin: 10.4 g/dL — ABNORMAL LOW (ref 12.0–15.0)
Immature Granulocytes: 0 %
Lymphocytes Relative: 2 %
Lymphs Abs: 0.2 10*3/uL — ABNORMAL LOW (ref 0.7–4.0)
MCH: 28.8 pg (ref 26.0–34.0)
MCHC: 31.4 g/dL (ref 30.0–36.0)
MCV: 91.7 fL (ref 80.0–100.0)
Monocytes Absolute: 0.4 10*3/uL (ref 0.1–1.0)
Monocytes Relative: 5 %
Neutro Abs: 7.1 10*3/uL (ref 1.7–7.7)
Neutrophils Relative %: 93 %
Platelets: 155 10*3/uL (ref 150–400)
RBC: 3.61 MIL/uL — ABNORMAL LOW (ref 3.87–5.11)
RDW: 13.1 % (ref 11.5–15.5)
WBC: 7.7 10*3/uL (ref 4.0–10.5)
nRBC: 0 % (ref 0.0–0.2)

## 2019-12-12 LAB — LACTIC ACID, PLASMA
Lactic Acid, Venous: 1.9 mmol/L (ref 0.5–1.9)
Lactic Acid, Venous: 1.5 mmol/L (ref 0.5–1.9)

## 2019-12-12 LAB — POC SARS CORONAVIRUS 2 AG: SARS Coronavirus 2 Ag: NEGATIVE

## 2019-12-12 MED ORDER — SODIUM CHLORIDE 0.9% FLUSH
3.0000 mL | Freq: Two times a day (BID) | INTRAVENOUS | Status: DC
  Administered 2019-12-12 – 2019-12-16 (×6): 3 mL via INTRAVENOUS

## 2019-12-12 MED ORDER — ACETAMINOPHEN 650 MG RE SUPP: 650.0000 mg | Freq: Four times a day (QID) | RECTAL | Status: DC | PRN

## 2019-12-12 MED ORDER — ENOXAPARIN SODIUM 40 MG/0.4ML ~~LOC~~ SOLN
40.0000 mg | SUBCUTANEOUS | Status: DC
  Administered 2019-12-13 – 2019-12-15 (×3): 40 mg via SUBCUTANEOUS
  Filled 2019-12-12 (×3): qty 0.4

## 2019-12-12 MED ORDER — SODIUM CHLORIDE 0.9 % IV SOLN
1.0000 g | INTRAVENOUS | Status: DC
  Filled 2019-12-12: qty 10

## 2019-12-12 MED ORDER — SODIUM CHLORIDE 0.9 % IV SOLN: INTRAVENOUS | Status: AC

## 2019-12-12 MED ORDER — VITAMIN D 25 MCG (1000 UNIT) PO TABS
2000.0000 [IU] | ORAL_TABLET | Freq: Every day | ORAL | Status: DC
  Administered 2019-12-13 – 2019-12-16 (×4): 2000 [IU] via ORAL
  Filled 2019-12-12 (×4): qty 2

## 2019-12-12 MED ORDER — ONDANSETRON HCL 4 MG PO TABS: 4.0000 mg | ORAL_TABLET | Freq: Four times a day (QID) | ORAL | Status: DC | PRN

## 2019-12-12 MED ORDER — POLYVINYL ALCOHOL 1.4 % OP SOLN
1.0000 [drp] | Freq: Two times a day (BID) | OPHTHALMIC | Status: DC
  Administered 2019-12-13 – 2019-12-16 (×7): 1 [drp] via OPHTHALMIC
  Filled 2019-12-12 (×2): qty 15

## 2019-12-12 MED ORDER — ONDANSETRON HCL 4 MG/2ML IJ SOLN: 4.0000 mg | Freq: Four times a day (QID) | INTRAMUSCULAR | Status: DC | PRN

## 2019-12-12 MED ORDER — FLUTICASONE PROPIONATE 50 MCG/ACT NA SUSP
1.0000 | Freq: Every day | NASAL | Status: DC
  Administered 2019-12-13 – 2019-12-16 (×4): 1 via NASAL
  Filled 2019-12-12 (×2): qty 16

## 2019-12-12 MED ORDER — ACETAMINOPHEN 325 MG PO TABS: 650.0000 mg | ORAL_TABLET | Freq: Four times a day (QID) | ORAL | Status: DC | PRN

## 2019-12-12 MED ORDER — SODIUM CHLORIDE 0.9 % IV SOLN
1.0000 g | Freq: Once | INTRAVENOUS | Status: AC
  Administered 2019-12-12: 1 g via INTRAVENOUS
  Filled 2019-12-12: qty 10

## 2019-12-12 MED ORDER — ACETAMINOPHEN 650 MG RE SUPP
650.0000 mg | RECTAL | Status: DC | PRN
  Administered 2019-12-12: 650 mg via RECTAL
  Filled 2019-12-12: qty 1

## 2019-12-12 MED ORDER — SODIUM CHLORIDE 0.9 % IV BOLUS
500.0000 mL | Freq: Once | INTRAVENOUS | Status: AC
  Administered 2019-12-12: 500 mL via INTRAVENOUS

## 2019-12-12 MED ORDER — SODIUM CHLORIDE 0.9 % IV BOLUS
1000.0000 mL | INTRAVENOUS | Status: AC
  Administered 2019-12-12: 1000 mL via INTRAVENOUS

## 2019-12-12 NOTE — ED Notes (Signed)
Pt given dinner tray this time and is being fed by this nurse. Pt tolerating well, no issues noted at this time.

## 2019-12-12 NOTE — H&P (Addendum)
TRH H&P   Patient Demographics:    Brandi Hanson, is a 84 y.o. female  MRN: [id number]   DOB - July 23, 1936  Admit Date - 12/12/2019  Outpatient Primary MD for the patient is System, Pcp Not In  Referring MD: Dr. Cherylann Banas  Outpatient Specialists: None  Patient coming from: Assisted living  Chief Complaint  Patient presents with  . Near Syncope      HPI:    Brandi Hanson  is a 84 y.o. female, with history of moderate to severe dementia, hypertension, history of breast cancer, GERD who was brought from assisted living with generalized weakness and acute change in her mental status from baseline.  When the staff attempted to transfer to the chair she was less responsive.  History provided by patient's nephew at bedside and ED physician. At baseline she is moderate to severely demented, identifies her family members but is not able to engage in a good conversation.  She has been found to be wandering around at the facility by the staff. No recent illness, sick contact, fevers, nausea, vomiting, diarrhea or complaints of dysuria.  About 1 week back patient had cloudy urine and UA and urine culture was ordered with concern for UTI at the facility however it did not get reported. No recent fall, complaint of headache, dizziness, chest pain, shortness of breath, weakness or numbness.  Patient is very confused and unable to provide any history.  At the facility she complained of some left lower abdominal pain for which she was given Maalox.  In the ED patient was septic with fever of 104.5 F , blood pressure initially of 101/59 mmHg, which dropped to 86/51 mmHg (MAP of 64) tachypneic at 22 and O2 sat of 93% on room air. Patient given 500 cc normal saline bolus and systolic blood pressure still in the high 80s.  EKG unremarkable. Blood work done showed normal WBC of 7.7, hemoglobin of 10.4,  normal platelets.  Chemistry was normal except for glucose of 142 and BUN of 25.  Blood culture sent.  Lactic acid x1 was normal.  Initial troponin was negative.  Patient tested negative for COVID-19. UA suggestive of UTI with positive nitrite, leukocyte, 29-50 WBC and rare bacteria.  Chest x-ray negative for infiltrate.  CT head without acute findings.   Blood cultures ordered and patient given IV Rocephin with suspicion for sepsis secondary to UTI. Hospitalist consulted for admission to medical floor.     Review of systems:    As outlined in HPI.  10 point review of systems limited due to patient's severe dementia and encephalopathy.  With Past History of the following :    Past Medical History:  Diagnosis Date  . Abdominal distention   . Abdominal pain   . Anxiety   . Arthritis   . Benign essential HTN 01/02/2012  . Breast cancer in situ 07/22/2011  .  Cancer ([place])    skin  . Confusion   . Depression   . Generalized headaches   . GERD (gastroesophageal reflux disease)   . Hyperlipidemia   . Hyperlipidemia 01/02/2012  . Hypertension   . Memory loss   . Osteoporosis   . Palpitations   . PVC (premature ventricular contraction)   . Rectal pain   . Sleep apnea       Past Surgical History:  Procedure Laterality Date  . ABDOMINAL HYSTERECTOMY  1978  . [place]   left  . breast cancer  10/2010   ductal carcinoma in situ, left breast  . CATARACT EXTRACTION  2011   bilateral  . FOOT SURGERY  2005  . KIDNEY STONE SURGERY  2011  . ROTATOR CUFF REPAIR  2007      Social History:     Social History   Tobacco Use  . Smoking status: Never Smoker  . Smokeless tobacco: Never Used  Substance Use Topics  . Alcohol use: No     Lives -at assisted living  Mobility -independent     Family History :     Family History  Problem Relation Age of Onset  . Cancer Mother        breast  . Crohn's disease Mother   . Cancer Father        lung  .  Hypertension Father   . Cancer Sister        breast and uterine  . Hypertension Sister   . Heart disease Maternal Grandmother   . Cancer Maternal Grandfather        colon  . Hypertension Maternal Grandfather   . Heart disease Paternal Grandmother   . Heart disease Paternal Grandfather       Home Medications:   Prior to Admission medications   Medication Sig Start Date End Date Taking? Authorizing Provider  acetaminophen (TYLENOL) 500 MG tablet Take 500 mg by mouth every 6 (six) hours as needed.    [provider]  alendronate (FOSAMAX) 70 MG tablet TAKE 1 TABLET BY MOUTH EVERY 7 DAYS. TAKE WITH A FULL GLASS OF WATER ON AN EMPTY STOMACH 06/19/17   Jerrol Banana., MD  calcium carbonate (TUMS - DOSED IN MG ELEMENTAL CALCIUM) 500 MG chewable tablet Chew 1 tablet by mouth. PRN    [provider]  Cholecalciferol (VITAMIN D3) 1000 UNITS CAPS Take 2 capsules by mouth daily.    [provider]  clidinium-chlordiazePOXIDE (LIBRAX) 5-2.5 MG capsule TAKE 1 OR 2 CAPSULES BY MOUTH 3 TIMES A DAY AS NEEDED 03/04/18   Jerrol Banana., MD  donepezil (ARICEPT) 10 MG tablet TAKE 1 TABLET BY MOUTH AT BEDTIME 05/16/17   Garvin Fila, MD  fluticasone (FLONASE) 50 MCG/ACT nasal spray Place into both nostrils daily.    [provider]  loratadine (CLARITIN) 10 MG tablet Take 10 mg by mouth daily.    [provider]  LORazepam (ATIVAN) 0.5 MG tablet Take 0.5 mg by mouth every 8 (eight) hours.    [provider]  memantine (NAMENDA XR) 28 MG CP24 24 hr capsule Take 1 capsule (28 mg total) by mouth daily. 02/12/18   Garvin Fila, MD  propranolol (INDERAL) 20 MG tablet Take 1 tablet (20 mg total) by mouth 2 (two) times daily. 01/07/18   Jerrol Banana., MD  Propylene Glycol (SYSTANE COMPLETE OP) Apply to eye.    [provider]  sertraline (  ZOLOFT) 100 MG tablet Take 1 tablet (100 mg total) by mouth daily. 09/21/17   Jerrol Banana., MD     Allergies:     Allergies  Allergen Reactions  . Sulfa Antibiotics Nausea Only     Physical Exam:   Vitals  Blood pressure (!) 88/51, pulse 76, temperature (!) 104.5 F (40.3 C), resp. rate (!) 21, height 5\' 5"  (1.651 m), weight 59 kg, SpO2 94 %.   General: Elderly pleasant female lying in bed in no acute distress, confused HEENT: Pupils reactive bilaterally, EOMI, pallor present, no icterus, moist mucosa, supple neck, no cervical lymphadenopathy Chest: Clear to auscultation bilaterally, no added sound CVs: Normal S1-S2, no murmur rub or gallop GI: Soft, nondistended, bowel sounds present, nontender, no CVA tenderness Musculoskeletal: Warm, no edema, no skin rash CNS: Alert and awake, oriented x0 (knows her nephew by the name but says he is her uncle).  Nonfocal.     Data Review:    CBC Recent Labs  Lab 12/12/19 1436  WBC 7.7  HGB 10.4*  HCT 33.1*  PLT 155  MCV 91.7  MCH 28.8  MCHC 31.4  RDW 13.1  LYMPHSABS 0.2*  MONOABS 0.4  EOSABS 0.0  BASOSABS 0.0   ------------------------------------------------------------------------------------------------------------------  Chemistries  Recent Labs  Lab 12/12/19 1436  NA 140  K 3.8  CL 100  CO2 29  GLUCOSE 142*  BUN 25*  CREATININE 0.79  CALCIUM 9.3  AST 20  ALT 13  ALKPHOS 76  BILITOT 0.8   ------------------------------------------------------------------------------------------------------------------ estimated creatinine clearance is 49.6 mL/min (by C-G formula based on SCr of 0.79 mg/dL). ------------------------------------------------------------------------------------------------------------------ No results for input(s): TSH, T4TOTAL, T3FREE, THYROIDAB in the last 72 hours.  Invalid input(s): FREET3  Coagulation profile No results for input(s): INR, PROTIME in the last 168  hours. ------------------------------------------------------------------------------------------------------------------- No results for input(s): DDIMER in the last 72 hours. -------------------------------------------------------------------------------------------------------------------  Cardiac Enzymes No results for input(s): CKMB, TROPONINI, MYOGLOBIN in the last 168 hours.  Invalid input(s): CK ------------------------------------------------------------------------------------------------------------------ No results found for: BNP   ---------------------------------------------------------------------------------------------------------------  Urinalysis    Component Value Date/Time   COLORURINE YELLOW (A) 12/12/2019 1539   APPEARANCEUR HAZY (A) 12/12/2019 1539   APPEARANCEUR Turbid (A) 02/14/2019 0000   LABSPEC 1.016 12/12/2019 1539   LABSPEC 1.005 01/15/2015 1229   PHURINE 6.0 12/12/2019 1539   GLUCOSEU NEGATIVE 12/12/2019 1539   GLUCOSEU Negative 01/15/2015 1229   HGBUR NEGATIVE 12/12/2019 1539   BILIRUBINUR NEGATIVE 12/12/2019 1539   BILIRUBINUR Negative 02/14/2019 0000   BILIRUBINUR Negative 01/15/2015 1229   KETONESUR NEGATIVE 12/12/2019 1539   PROTEINUR 30 (A) 12/12/2019 1539   UROBILINOGEN 0.2 01/23/2017 1348   UROBILINOGEN 0.2 01/15/2015 1229   NITRITE POSITIVE (A) 12/12/2019 1539   LEUKOCYTESUR SMALL (A) 12/12/2019 1539   LEUKOCYTESUR Moderate 01/15/2015 1229    ----------------------------------------------------------------------------------------------------------------   Imaging Results:    CT Head Wo Contrast  Result Date: 12/12/2019 CLINICAL DATA:  Altered mental status. Near syncope. EXAM: CT HEAD WITHOUT CONTRAST TECHNIQUE: Contiguous axial images were obtained from the base of the skull through the vertex without intravenous contrast. COMPARISON:  07/02/2013 FINDINGS: Brain: No intracranial hemorrhage, mass effect, or midline shift. Normal for  age atrophy. No hydrocephalus. The basilar cisterns are patent. Mild to moderate chronic small vessel ischemia. No evidence of territorial infarct or acute ischemia. No extra-axial or intracranial fluid collection. Vascular: Atherosclerosis of skullbase vasculature without hyperdense vessel or abnormal calcification. Skull: No fracture or focal lesion. Sinuses/Orbits: Mucosal thickening and partial opacification of  right frontal sinus. Paranasal sinuses are otherwise clear. Mastoid air cells are clear. Orbits are unremarkable. Post cataract resection Other: None. IMPRESSION: 1. No acute intracranial abnormality. 2. Mild chronic small vessel ischemia. Electronically Signed   By: Keith Rake M.D.   On: 12/12/2019 16:19   DG Chest Portable 1 View  Result Date: 12/12/2019 CLINICAL DATA:  Fever and near syncope EXAM: PORTABLE CHEST 1 VIEW COMPARISON:  April 03, 2014 FINDINGS: No edema or airspace opacity. Heart size and pulmonary vascularity are normal. No adenopathy. There is aortic atherosclerosis. There is degenerative change in each shoulder. IMPRESSION: No edema or airspace opacity. Cardiac silhouette within normal limits. Aortic Atherosclerosis (ICD10-I70.0). Electronically Signed   By: Lowella Grip III M.D.   On: 12/12/2019 14:56    My personal review of EKG normal sinus rhythm at 86 with PVCs, no ST-T changes.   Assessment & Plan:    Principal Problem:   Severe sepsis with acute organ dysfunction ([place]) Secondary to UTI (fever of 104 F, tachypnea and hypotension). Received 1 g IV Rocephin in the ED, I will continue daily IV Rocephin.  Patient had UTI secondary to Enterococcus faecalis in 2020 and was sensitive to penicillin.  Follow blood cultures, repeat lactic acid. Blood pressure still low and ordered another 1 L IV normal saline bolus followed by maintenance IV normal saline at 100 cc/h. Tylenol as needed for fever.  Active Problems: Toxic metabolic encephalopathy Secondary to  sepsis.  Head CT negative for acute findings.  Dementia moderate to severe Has worsened mental status due to underlying sepsis.  High risk for sundowning.  Needs frequent monitoring.  Hold Aricept and Namenda for now.  Near syncope Likely orthostatic with dehydration and sepsis.  Hold home blood pressure meds.  Monitor with IV fluids on telemetry.   DVT Prophylaxis: Subcu Lovenox  AM Labs Ordered, also please review Full Orders  Family Communication: Admission, patients condition and plan of care including tests being ordered have been discussed with nephew at bedside.  Patient sister is the healthcare power of attorney and I spoke with her on the phone.  Code Status: DNR,  I confirmed this with her sister who is the healthcare power of attorney. She read me her living will on the phone which mentions patient does not desire to be put on a life support, aggressive resuscitative measures if she has a critical illness or advanced dementia.   Likely DC to return to assisted living once improved  Condition GUARDED   Consults called: None  Admission status: Inpatient Patient is septic with high-grade fever, tachypnea, hypotension likely secondary to UTI and needs to be monitored in an inpatient setting with aggressive IV hydration and empiric IV antibiotic.  Patient is at high risk for complications including septic shock, cardiorespiratory compromise and target organ damage.  Patient needs to be monitored in an inpatient setting for at least >2 midnight.  Time spent in minutes : 70   Aydden Cumpian M.D on 12/12/2019 at 5:40 PM  Between 7am to 7pm - Pager - [phone]. After 7pm go to [email or web address] - password [facility]  [facility] - Office  [phone]

## 2019-12-12 NOTE — ED Triage Notes (Signed)
Pt arrival via [facility] due to near syncope episode at [facility]. EMS states that staff tried to transfer her to her chair and that she had a near syncope episode and was not responding to them as usual. Pt's level of consciousness was significantly decreased upon EMS arrival but EMS states she's responding more now. Pt can follow some commands with Dr. Cherylann Banas at bedside. Pt appears comfortable at this time.

## 2019-12-12 NOTE — ED Provider Notes (Signed)
[facility] Emergency Department Provider Note ____________________________________________   First MD Initiated Contact with Patient 12/12/19 1432     (approximate)  I have reviewed the triage vital signs and the nursing notes.   HISTORY  Chief Complaint Near Syncope  Level 5 caveat: History of present illness limited due to dementia  HPI Brandi Hanson is a 84 y.o. female with PMH as noted below who presents with altered mental status and weakness acute onset today.  The patient was noted to have a syncopal episode while at her facility.  When the staff tried to transfer her to her chair, she appeared less responsive than normal and was sliding out of the chair.  She has become somewhat more responsive during EMS transport.  She is normally confused at baseline but verbal.  The patient is unable to give any history.  Past Medical History:  Diagnosis Date  . Abdominal distention   . Abdominal pain   . Anxiety   . Arthritis   . Benign essential HTN 01/02/2012  . Breast cancer in situ 07/22/2011  . Cancer ([place])    skin  . Confusion   . Depression   . Generalized headaches   . GERD (gastroesophageal reflux disease)   . Hyperlipidemia   . Hyperlipidemia 01/02/2012  . Hypertension   . Memory loss   . Osteoporosis   . Palpitations   . PVC (premature ventricular contraction)   . Rectal pain   . Sleep apnea     Patient Active Problem List   Diagnosis Date Noted  . Severe sepsis ([place]) 12/12/2019  . Severe sepsis with acute organ dysfunction ([place]) 12/12/2019  . Dementia without behavioral disturbance ([place]) 12/12/2019  . Toxic metabolic encephalopathy [id number]  . Near syncope 12/12/2019  . Aortic atherosclerosis ([place]) 01/03/2017  . Renal calculi 01/03/2017  . Allergic rhinitis 07/07/2015  . MCI (mild cognitive impairment) with memory loss 03/25/2015  . Depression, major, recurrent, mild ([place]) 03/25/2015  . Generalized anxiety disorder  03/25/2015  . IBS (irritable bowel syndrome) 02/15/2015  . At risk for falling 01/08/2015  . Body mass index (BMI) of 23.0-23.9 in adult 01/08/2015  . H/O malignant neoplasm of breast 01/08/2015  . Chronic constipation 01/08/2015  . Breast cancer of upper-outer quadrant of left female breast ([place]) 01/08/2015  . HZV (herpes zoster virus) post herpetic neuralgia 01/08/2015  . Apnea, sleep 01/08/2015  . Palpitations 08/21/2013  . Benign essential HTN 01/02/2012  . Beat, premature ventricular 04/21/2009  . Absolute anemia 02/05/2009  . Acid reflux 02/05/2009  . Essential (primary) hypertension 02/05/2009  . Bone/cartilage disorder 07/22/2007  . Avitaminosis D 10/08/2006  . Carotid artery obstruction 09/11/1998    Past Surgical History:  Procedure Laterality Date  . ABDOMINAL HYSTERECTOMY  1978  . [place]   left  . breast cancer  10/2010   ductal carcinoma in situ, left breast  . CATARACT EXTRACTION  2011   bilateral  . FOOT SURGERY  2005  . KIDNEY STONE SURGERY  2011  . ROTATOR CUFF REPAIR  2007    Prior to Admission medications   Medication Sig Start Date End Date Taking? Authorizing Provider  acetaminophen (TYLENOL) 325 MG tablet Take 650 mg by mouth in the morning and at bedtime.    Yes [provider]  Calcium Carb-Cholecalciferol (CALCIUM 600+D) 600-800 MG-UNIT TABS Take 1 tablet by mouth in the morning and at bedtime.   Yes [provider]  donepezil (ARICEPT) 10 MG  tablet TAKE 1 TABLET BY MOUTH AT BEDTIME Patient taking differently: Take 10 mg by mouth at bedtime.  05/16/17  Yes Garvin Fila, MD  fluticasone (FLONASE) 50 MCG/ACT nasal spray Place 1 spray into both nostrils daily.    Yes [provider]  loratadine (CLARITIN) 10 MG tablet Take 10 mg by mouth daily.   Yes [provider]  LORazepam (ATIVAN) 0.5 MG tablet Take 0.25 mg by mouth 2 (two) times daily.    Yes [provider]  melatonin 3 MG TABS  tablet Take 3 mg by mouth at bedtime.   Yes [provider]  memantine (NAMENDA) 10 MG tablet Take 10 mg by mouth 2 (two) times daily.   Yes [provider]  ondansetron (ZOFRAN) 8 MG tablet Take 8 mg by mouth every 8 (eight) hours as needed for nausea or vomiting.   Yes [provider]  propranolol (INDERAL) 20 MG tablet Take 1 tablet (20 mg total) by mouth 2 (two) times daily. 01/07/18  Yes Jerrol Banana., MD  Propylene Glycol (SYSTANE COMPLETE OP) Place 1 drop into both eyes in the morning and at bedtime.    Yes [provider]  risperiDONE (RISPERDAL) 0.25 MG tablet Take 0.25 mg by mouth at bedtime.   Yes [provider]  sertraline (ZOLOFT) 100 MG tablet Take 150 mg by mouth daily.   Yes [provider]    Allergies Sulfa antibiotics  Family History  Problem Relation Age of Onset  . Cancer Mother        breast  . Crohn's disease Mother   . Cancer Father        lung  . Hypertension Father   . Cancer Sister        breast and uterine  . Hypertension Sister   . Heart disease Maternal Grandmother   . Cancer Maternal Grandfather        colon  . Hypertension Maternal Grandfather   . Heart disease Paternal Grandmother   . Heart disease Paternal Grandfather     Social History Social History   Tobacco Use  . Smoking status: Never Smoker  . Smokeless tobacco: Never Used  Substance Use Topics  . Alcohol use: No  . Drug use: No    Review of Systems Level 5 caveat: Unable to 10 review of systems due to dementia    ____________________________________________   PHYSICAL EXAM:  VITAL SIGNS: ED Triage Vitals [12/12/19 1434]  Enc Vitals Group     BP (!) 120/58     Pulse Rate 92     Resp (!) 24     Temp (!) 104.5 F (40.3 C)     Temp src      SpO2 100 %     Weight 130 lb (59 kg)     Height 5\' 5"  (1.651 m)     Head Circumference      Peak Flow      Pain Score      Pain Loc      Pain Edu?      Excl. in  [place]?     Constitutional: Alert, following some commands. Eyes: Conjunctivae are normal.  EOMI.  PERRLA. Head: Atraumatic. Nose: No congestion/rhinnorhea. Mouth/Throat: Mucous membranes are slightly dry.   Neck: Normal range of motion.  Cardiovascular: Normal rate, regular rhythm. Grossly normal heart sounds.  Good peripheral circulation. Respiratory: Normal respiratory effort.  No retractions. Lungs CTAB. Gastrointestinal: Soft and nontender. No distention.  Genitourinary: No  flank tenderness. Musculoskeletal: No lower extremity edema.  Extremities warm and well perfused.  Neurologic: Motor intact in all extremities. Skin:  Skin is warm and dry. No rash noted. Psychiatric: Unable to assess.  ____________________________________________   LABS (all labs ordered are listed, but only abnormal results are displayed)  Labs Reviewed  COMPREHENSIVE METABOLIC PANEL - Abnormal; Notable for the following components:      Result Value   Glucose, Bld 142 (*)    BUN 25 (*)    All other components within normal limits  CBC WITH DIFFERENTIAL/PLATELET - Abnormal; Notable for the following components:   RBC 3.61 (*)    Hemoglobin 10.4 (*)    HCT 33.1 (*)    Lymphs Abs 0.2 (*)    All other components within normal limits  URINALYSIS, COMPLETE (UACMP) WITH MICROSCOPIC - Abnormal; Notable for the following components:   Color, Urine YELLOW (*)    APPearance HAZY (*)    Protein, ur 30 (*)    Nitrite POSITIVE (*)    Leukocytes,Ua SMALL (*)    Bacteria, UA RARE (*)    All other components within normal limits  CULTURE, BLOOD (ROUTINE X 2)  CULTURE, BLOOD (ROUTINE X 2)  SARS CORONAVIRUS 2 (TAT 6-24 HRS)  LACTIC ACID, PLASMA  LACTIC ACID, PLASMA  CBC  CREATININE, SERUM  BASIC METABOLIC PANEL  POC SARS CORONAVIRUS 2 AG -  ED  POC SARS CORONAVIRUS 2 AG  TROPONIN I (HIGH SENSITIVITY)  TROPONIN I (HIGH SENSITIVITY)   ____________________________________________  EKG  ED ECG REPORT I,  Arta Silence, the attending physician, personally viewed and interpreted this ECG.  Date: 12/12/2019 EKG Time: 1443 Rate: 86 Rhythm: normal sinus rhythm with PVCs QRS Axis: normal Intervals: normal ST/T Wave abnormalities: normal Narrative Interpretation: no evidence of acute ischemia  ____________________________________________  RADIOLOGY  CXR: No focal infiltrate or edema CT head: No acute abnormality ____________________________________________   PROCEDURES  Procedure(s) performed: No  Procedures  Critical Care performed: No ____________________________________________   INITIAL IMPRESSION / ASSESSMENT AND PLAN / ED COURSE  Pertinent labs & imaging results that were available during my care of the patient were reviewed by me and considered in my medical decision making (see chart for details).  84 year old female with PMH as noted above presents with syncope, weakness, and altered mental status.  The patient apparently had a syncopal event while at her facility.  She was placed into a chair and then appeared less responsive than normal and was sliding out of the chair.  The patient is unable to give any history.  On exam, she is alert and follows commands intermittently.  She is interactive.  Her vital signs are normal except for a fever of 104.  Neurologic exam is nonfocal.  The remainder of the exam is relatively unremarkable.  Overall presentation is most consistent with infectious etiology, likely UTI versus possible pneumonia.  The patient is moving her neck freely and appears to be returning to her baseline mental status.  I do not suspect meningitis.  We will obtain a sepsis work-up, chest x-ray, UA, and reassess.  I will also obtain a CT given the patient's AMS.  ----------------------------------------- 5:59 PM on 12/12/2019 -----------------------------------------  Chest x-ray and CT head are both negative.  Work-up reveals findings compatible with a  UTI.  The patient's nephew who is now here states that she was suspected for UTI for at least the last several days but there was some delay in obtaining a test at her facility.  I reviewed the past medical records in [place].  The patient had a urine culture in May of last year showing Enterococcus susceptible to cephalosporins.  Given the patient's syncope and confusion as well as high fever, I will proceed with admission.  Her blood pressure is also borderline low at this time although some of this may be positional.  I have ordered fluids and IV ceftriaxone.  I discussed the case with the hospitalist for admission.  ____________________________________________   FINAL CLINICAL IMPRESSION(S) / ED DIAGNOSES  Final diagnoses:  Urinary tract infection without hematuria, site unspecified      NEW MEDICATIONS STARTED DURING THIS VISIT:  New Prescriptions   No medications on file     Note:  This document was prepared using Dragon voice recognition software and may include unintentional dictation errors.    Arta Silence, MD 12/12/19 1800

## 2019-12-12 NOTE — ED Notes (Signed)
Pt changed and placed in new brief/chuck. Pt appears to be comfortable at this time, no further needs noted. Will continue to monitor. Bed locked in lowest position, call light in reach, blinds open to monitor patient.

## 2019-12-13 LAB — BLOOD CULTURE ID PANEL (REFLEXED)

## 2019-12-13 LAB — BASIC METABOLIC PANEL
Anion gap: 6 (ref 5–15)
BUN: 28 mg/dL — ABNORMAL HIGH (ref 8–23)
CO2: 28 mmol/L (ref 22–32)
Calcium: 8.5 mg/dL — ABNORMAL LOW (ref 8.9–10.3)
Chloride: 108 mmol/L (ref 98–111)
Creatinine, Ser: 0.76 mg/dL (ref 0.44–1.00)
GFR calc Af Amer: >60 mL/min (ref >60)
GFR calc non Af Amer: >60 mL/min (ref >60)
Glucose, Bld: 135 mg/dL — ABNORMAL HIGH (ref 70–99)
Potassium: 3.9 mmol/L (ref 3.5–5.1)
Sodium: 142 mmol/L (ref 135–145)

## 2019-12-13 MED ORDER — SODIUM CHLORIDE 0.9 % IV SOLN
2.0000 g | INTRAVENOUS | Status: DC
  Administered 2019-12-13 – 2019-12-15 (×3): 2 g via INTRAVENOUS
  Filled 2019-12-13: qty 20
  Filled 2019-12-13 (×2): qty 2
  Filled 2019-12-13: qty 20

## 2019-12-13 MED ORDER — HALOPERIDOL LACTATE 5 MG/ML IJ SOLN
0.5000 mg | Freq: Four times a day (QID) | INTRAMUSCULAR | Status: DC | PRN
  Administered 2019-12-14 – 2019-12-15 (×3): 0.5 mg via INTRAVENOUS
  Filled 2019-12-13 (×4): qty 1

## 2019-12-13 NOTE — Progress Notes (Addendum)
PROGRESS NOTE                                                                                                                                                                                                             Patient Demographics:    Brandi Hanson, is a 84 y.o. female, DOB - 20-Aug-1936, [id number]  Admit date - 12/12/2019   Admitting Physician Louellen Molder, MD  Outpatient Primary MD for the patient is System, Pcp Not In  LOS - 1    Chief Complaint  Patient presents with  . Near Syncope       Brief Narrative   84 year old severely demented female with history of hypertension, GERD resident of assisted living presented with generalized weakness and acute change in her mental status.  Patient found to be in severe sepsis with fever of 104.5 F, hypotension and tachypnea.  UA suggestive of UTI.  Given IV fluid bolus and empiric antibiotic and admitted for further management.   Subjective:   Patient no longer febrile.  Has been restless since admission trying to pull out lines.  Assessment  & Plan :    Principal Problem:   Severe sepsis with acute organ dysfunction ([place]) Patient hypotensive on presentation, elevated BUN and bacteremia due to Enterobacter. Secondary to UTI.  Urine culture unfortunately not sent on admission and patient had already received IV antibiotic.  Urine culture has been sent today.  Blood culture growing Enterobacter.  Continue empiric Rocephin 2 g daily.  Follow sensitivity. Sepsis currently resolved.  Active Problems: Toxic metabolic encephalopathy Has severe underlying dementia (moderate to severe) with worsened encephalopathy due to sepsis.  Patient restless trying to pull out lines and getting out of bed.  Acute encephalopathy has resolved and dementia appears at baseline.  Needs frequent monitoring.  Near syncope Noted at the facility. Orthostatic secondary to hypotension  and sepsis.  Blood pressure improved on fluids.     Code Status : DNR  Family Communication  : Discussed with sister and nephew on admission.  Disposition Plan  : Possibly return to ALF in the next 48 hours if symptoms improved, no longer septic and final cultures and sensitivity available  Barriers For Discharge : UTI, improving sepsis, pending final culture and sensitivity  Consults  : None  Procedures  : CT head  DVT Prophylaxis  :  Subcu Lovenox  Lab Results  Component Value Date   PLT 155 12/12/2019    Antibiotics  :    Anti-infectives (From admission, onward)   Start     Dose/Rate Route Frequency Ordered Stop   12/13/19 1800  cefTRIAXone (ROCEPHIN) 1 g in sodium chloride 0.9 % 100 mL IVPB  Status:  Discontinued     1 g 200 mL/hr over 30 Minutes Intravenous Every 24 hours 12/12/19 1734 12/13/19 1000   12/13/19 1200  cefTRIAXone (ROCEPHIN) 2 g in sodium chloride 0.9 % 100 mL IVPB     2 g 200 mL/hr over 30 Minutes Intravenous Every 24 hours 12/13/19 1000     12/12/19 1715  cefTRIAXone (ROCEPHIN) 1 g in sodium chloride 0.9 % 100 mL IVPB     1 g 200 mL/hr over 30 Minutes Intravenous  Once 12/12/19 1709 12/12/19 1802        Objective:   Vitals:   12/12/19 2058 12/12/19 2126 12/13/19 0400 12/13/19 0535  BP:  (!) 92/49 (!) 113/53   Pulse:  76 75 71  Resp:  16    Temp: 98.3 F (36.8 C) 98.3 F (36.8 C)  99.6 F (37.6 C)  TempSrc: Oral Oral  Oral  SpO2:  98%  98%  Weight:  53 kg    Height:  5\' 6"  (1.676 m)      Wt Readings from Last 3 Encounters:  12/12/19 53 kg  11/18/18 55.2 kg  09/18/18 55.3 kg     Intake/Output Summary (Last 24 hours) at 12/13/2019 1148 Last data filed at 12/13/2019 1000 Gross per 24 hour  Intake 519.22 ml  Output 200 ml  Net 319.22 ml     Physical Exam  Gen: not in distress, confused HEENT: Moist mucosa, supple Chest: clear b/l, no added sounds CVS: N S1&S2, no murmurs,  GI: soft, NT, ND, BS+ Musculoskeletal: warm, no  edema     Data Review:    CBC Recent Labs  Lab 12/12/19 1436  WBC 7.7  HGB 10.4*  HCT 33.1*  PLT 155  MCV 91.7  MCH 28.8  MCHC 31.4  RDW 13.1  LYMPHSABS 0.2*  MONOABS 0.4  EOSABS 0.0  BASOSABS 0.0    Chemistries  Recent Labs  Lab 12/12/19 1436 12/13/19 0415  NA 140 142  K 3.8 3.9  CL 100 108  CO2 29 28  GLUCOSE 142* 135*  BUN 25* 28*  CREATININE 0.79 0.76  CALCIUM 9.3 8.5*  AST 20  --   ALT 13  --   ALKPHOS 76  --   BILITOT 0.8  --    ------------------------------------------------------------------------------------------------------------------ No results for input(s): CHOL, HDL, LDLCALC, TRIG, CHOLHDL, LDLDIRECT in the last 72 hours.  Lab Results  Component Value Date   HGBA1C 5.6 11/28/2016   ------------------------------------------------------------------------------------------------------------------ No results for input(s): TSH, T4TOTAL, T3FREE, THYROIDAB in the last 72 hours.  Invalid input(s): FREET3 ------------------------------------------------------------------------------------------------------------------ No results for input(s): VITAMINB12, FOLATE, FERRITIN, TIBC, IRON, RETICCTPCT in the last 72 hours.  Coagulation profile No results for input(s): INR, PROTIME in the last 168 hours.  No results for input(s): DDIMER in the last 72 hours.  Cardiac Enzymes No results for input(s): CKMB, TROPONINI, MYOGLOBIN in the last 168 hours.  Invalid input(s): CK ------------------------------------------------------------------------------------------------------------------ No results found for: BNP  Inpatient Medications  Scheduled Meds: . cholecalciferol  2,000 Units Oral Daily  . enoxaparin (LOVENOX) injection  40 mg Subcutaneous Q24H  . fluticasone  1 spray Each Nare Daily  . polyvinyl alcohol  1 drop Both Eyes BID PC  . sodium chloride flush  3 mL Intravenous Q12H   Continuous Infusions: . sodium chloride 100 mL/hr at  12/13/19 0102  . cefTRIAXone (ROCEPHIN)  IV 2 g (12/13/19 1114)   PRN Meds:.acetaminophen **OR** acetaminophen, ondansetron **OR** ondansetron (ZOFRAN) IV  Micro Results Recent Results (from the past 240 hour(s))  Culture, blood (routine x 2)     Status: None (Preliminary result)   Collection Time: 12/12/19  2:39 PM   Specimen: BLOOD  Result Value Ref Range Status   Specimen Description BLOOD BLOOD LEFT WRIST  Final   Special Requests   Final    BOTTLES DRAWN AEROBIC AND ANAEROBIC Blood Culture adequate volume   Culture  Setup Time   Final    Organism ID to follow GRAM NEGATIVE RODS AEROBIC BOTTLE ONLY CRITICAL RESULT CALLED TO, READ BACK BY AND VERIFIED WITH: CHRISTINE KATSOUDAS AT [place] ON 12/13/2019 [place]. Performed at [facility], [address] [place], [place] [address]    Culture GRAM NEGATIVE RODS  Final   Report Status PENDING  Incomplete  Blood Culture ID Panel (Reflexed)     Status: Abnormal   Collection Time: 12/12/19  2:39 PM  Result Value Ref Range Status   Enterococcus species NOT DETECTED NOT DETECTED Final   Listeria monocytogenes NOT DETECTED NOT DETECTED Final   Staphylococcus species NOT DETECTED NOT DETECTED Final   Staphylococcus aureus (BCID) NOT DETECTED NOT DETECTED Final   Streptococcus species NOT DETECTED NOT DETECTED Final   Streptococcus agalactiae NOT DETECTED NOT DETECTED Final   Streptococcus pneumoniae NOT DETECTED NOT DETECTED Final   Streptococcus pyogenes NOT DETECTED NOT DETECTED Final   Acinetobacter baumannii NOT DETECTED NOT DETECTED Final   Enterobacteriaceae species DETECTED (A) NOT DETECTED Final    Comment: Enterobacteriaceae represent a large family of gram-negative bacteria, not a single organism. CRITICAL RESULT CALLED TO, READ BACK BY AND VERIFIED WITH: CHRISTINE KATSOUDAS AT [place] ON 12/13/2019 [place].    Enterobacter cloacae complex NOT DETECTED NOT DETECTED Final   Escherichia coli NOT DETECTED NOT DETECTED Final    Klebsiella oxytoca NOT DETECTED NOT DETECTED Final   Klebsiella pneumoniae DETECTED (A) NOT DETECTED Final    Comment: CRITICAL RESULT CALLED TO, READ BACK BY AND VERIFIED WITH: CHRISTINE KATSOUDAS AT 0945 ON 12/13/2019 [place].    Proteus species NOT DETECTED NOT DETECTED Final   Serratia marcescens NOT DETECTED NOT DETECTED Final   Carbapenem resistance NOT DETECTED NOT DETECTED Final   Haemophilus influenzae NOT DETECTED NOT DETECTED Final   Neisseria meningitidis NOT DETECTED NOT DETECTED Final   Pseudomonas aeruginosa NOT DETECTED NOT DETECTED Final   Candida albicans NOT DETECTED NOT DETECTED Final   Candida glabrata NOT DETECTED NOT DETECTED Final   Candida krusei NOT DETECTED NOT DETECTED Final   Candida parapsilosis NOT DETECTED NOT DETECTED Final   Candida tropicalis NOT DETECTED NOT DETECTED Final    Comment: Performed at [facility], [address] [place], [place] [address]  Culture, blood (routine x 2)     Status: None (Preliminary result)   Collection Time: 12/12/19  2:44 PM   Specimen: BLOOD  Result Value Ref Range Status   Specimen Description BLOOD LEFT ANTECUBITAL  Final   Special Requests   Final    BOTTLES DRAWN AEROBIC AND ANAEROBIC Blood Culture results may not be optimal due to an inadequate volume of blood received in culture bottles   Culture   Final    NO GROWTH <  15 HOURS Performed at [facility], [address], [place], [place] [address]    Report Status PENDING  Incomplete    Radiology Reports CT Head Wo Contrast  Result Date: 12/12/2019 CLINICAL DATA:  Altered mental status. Near syncope. EXAM: CT HEAD WITHOUT CONTRAST TECHNIQUE: Contiguous axial images were obtained from the base of the skull through the vertex without intravenous contrast. COMPARISON:  07/02/2013 FINDINGS: Brain: No intracranial hemorrhage, mass effect, or midline shift. Normal for age atrophy. No hydrocephalus. The basilar cisterns are patent. Mild to moderate  chronic small vessel ischemia. No evidence of territorial infarct or acute ischemia. No extra-axial or intracranial fluid collection. Vascular: Atherosclerosis of skullbase vasculature without hyperdense vessel or abnormal calcification. Skull: No fracture or focal lesion. Sinuses/Orbits: Mucosal thickening and partial opacification of right frontal sinus. Paranasal sinuses are otherwise clear. Mastoid air cells are clear. Orbits are unremarkable. Post cataract resection Other: None. IMPRESSION: 1. No acute intracranial abnormality. 2. Mild chronic small vessel ischemia. Electronically Signed   By: Keith Rake M.D.   On: 12/12/2019 16:19   DG Chest Portable 1 View  Result Date: 12/12/2019 CLINICAL DATA:  Fever and near syncope EXAM: PORTABLE CHEST 1 VIEW COMPARISON:  April 03, 2014 FINDINGS: No edema or airspace opacity. Heart size and pulmonary vascularity are normal. No adenopathy. There is aortic atherosclerosis. There is degenerative change in each shoulder. IMPRESSION: No edema or airspace opacity. Cardiac silhouette within normal limits. Aortic Atherosclerosis (ICD10-I70.0). Electronically Signed   By: Lowella Grip III M.D.   On: 12/12/2019 14:56    Time Spent in minutes 35   Cherly Erno M.D on 12/13/2019 at 11:48 AM  Between 7am to 7pm - Pager - [phone]  After 7pm go to [email or web address] - password [facility]  [facility] -  Office  [phone]

## 2019-12-13 NOTE — Progress Notes (Signed)
PHARMACY - PHYSICIAN COMMUNICATION CRITICAL VALUE ALERT - BLOOD CULTURE IDENTIFICATION (BCID)  Results for orders placed or performed during the hospital encounter of 12/12/19  Blood Culture ID Panel (Reflexed) (Collected: 12/12/2019  2:39 PM)  Result Value Ref Range   Enterococcus species NOT DETECTED NOT DETECTED   Listeria monocytogenes NOT DETECTED NOT DETECTED   Staphylococcus species NOT DETECTED NOT DETECTED   Staphylococcus aureus (BCID) NOT DETECTED NOT DETECTED   Streptococcus species NOT DETECTED NOT DETECTED   Streptococcus agalactiae NOT DETECTED NOT DETECTED   Streptococcus pneumoniae NOT DETECTED NOT DETECTED   Streptococcus pyogenes NOT DETECTED NOT DETECTED   Acinetobacter baumannii NOT DETECTED NOT DETECTED   Enterobacteriaceae species DETECTED (A) NOT DETECTED   Enterobacter cloacae complex NOT DETECTED NOT DETECTED   Escherichia coli NOT DETECTED NOT DETECTED   Klebsiella oxytoca NOT DETECTED NOT DETECTED   Klebsiella pneumoniae DETECTED (A) NOT DETECTED   Proteus species NOT DETECTED NOT DETECTED   Serratia marcescens NOT DETECTED NOT DETECTED   Carbapenem resistance NOT DETECTED NOT DETECTED   Haemophilus influenzae NOT DETECTED NOT DETECTED   Neisseria meningitidis NOT DETECTED NOT DETECTED   Pseudomonas aeruginosa NOT DETECTED NOT DETECTED   Candida albicans NOT DETECTED NOT DETECTED   Candida glabrata NOT DETECTED NOT DETECTED   Candida krusei NOT DETECTED NOT DETECTED   Candida parapsilosis NOT DETECTED NOT DETECTED   Candida tropicalis NOT DETECTED NOT DETECTED   Organism: 1/4 bottles K pneumoniae  Current antibiotics: ceftriaxone 1 gram IV every 24 hours  Name of physician (or Provider) Contacted: Dr Clementeen Graham  Changes to prescribed antibiotics required: adjusted ceftriaxone dose to 2 grams every 24 hours  Dallie Piles 12/13/2019  9:51 AM

## 2019-12-14 DIAGNOSIS — D509 Iron deficiency anemia, unspecified: Secondary | ICD-10-CM

## 2019-12-14 DIAGNOSIS — B9689 Other specified bacterial agents as the cause of diseases classified elsewhere: Secondary | ICD-10-CM

## 2019-12-14 DIAGNOSIS — R7881 Bacteremia: Secondary | ICD-10-CM

## 2019-12-14 LAB — CBC
HCT: 25.9 % — ABNORMAL LOW (ref 36.0–46.0)
Hemoglobin: 8.4 g/dL — ABNORMAL LOW (ref 12.0–15.0)
MCH: 29.1 pg (ref 26.0–34.0)
MCHC: 32.4 g/dL (ref 30.0–36.0)
MCV: 89.6 fL (ref 80.0–100.0)
Platelets: 112 10*3/uL — ABNORMAL LOW (ref 150–400)
RBC: 2.89 MIL/uL — ABNORMAL LOW (ref 3.87–5.11)
RDW: 13.3 % (ref 11.5–15.5)
WBC: 9 10*3/uL (ref 4.0–10.5)
nRBC: 0 % (ref 0.0–0.2)

## 2019-12-14 LAB — BASIC METABOLIC PANEL
Anion gap: 4 — ABNORMAL LOW (ref 5–15)
BUN: 20 mg/dL (ref 8–23)
CO2: 26 mmol/L (ref 22–32)
Calcium: 8.2 mg/dL — ABNORMAL LOW (ref 8.9–10.3)
Chloride: 109 mmol/L (ref 98–111)
Creatinine, Ser: 0.6 mg/dL (ref 0.44–1.00)
GFR calc Af Amer: >60 mL/min (ref >60)
GFR calc non Af Amer: >60 mL/min (ref >60)
Glucose, Bld: 103 mg/dL — ABNORMAL HIGH (ref 70–99)
Potassium: 3.4 mmol/L — ABNORMAL LOW (ref 3.5–5.1)
Sodium: 139 mmol/L (ref 135–145)

## 2019-12-14 LAB — IRON AND TIBC
Iron: 9 ug/dL — ABNORMAL LOW (ref 28–170)
Saturation Ratios: 6 % — ABNORMAL LOW (ref 10.4–31.8)
TIBC: 144 ug/dL — ABNORMAL LOW (ref 250–450)
UIBC: 135 ug/dL

## 2019-12-14 LAB — VITAMIN B12: Vitamin B-12: 175 pg/mL — ABNORMAL LOW (ref 180–914)

## 2019-12-14 LAB — FERRITIN: Ferritin: 220 ng/mL (ref 11–307)

## 2019-12-14 MED ORDER — POTASSIUM CHLORIDE CRYS ER 20 MEQ PO TBCR
40.0000 meq | EXTENDED_RELEASE_TABLET | Freq: Once | ORAL | Status: AC
  Administered 2019-12-14: 40 meq via ORAL
  Filled 2019-12-14: qty 2

## 2019-12-14 MED ORDER — ENSURE ENLIVE PO LIQD
237.0000 mL | Freq: Two times a day (BID) | ORAL | Status: DC
  Administered 2019-12-14 – 2019-12-16 (×4): 237 mL via ORAL

## 2019-12-14 NOTE — Progress Notes (Signed)
PROGRESS NOTE                                                                                                                                                                                                             Patient Demographics:    Brandi Hanson, is a 84 y.o. female, DOB - 1935-09-27, [id number]  Admit date - 12/12/2019   Admitting Physician Louellen Molder, MD  Outpatient Primary MD for the patient is System, Pcp Not In  LOS - 2    Chief Complaint  Patient presents with  . Near Syncope       Brief Narrative   84 year old severely demented female with history of hypertension, GERD resident of assisted living presented with generalized weakness and acute change in her mental status.  Patient found to be in severe sepsis with fever of 104.5 F, hypotension and tachypnea.  UA suggestive of UTI.  Given IV fluid bolus and empiric antibiotic and admitted for further management.   Subjective:   Frequently restless trying to get out of bed.  Afebrile.  Assessment  & Plan :    Principal Problem:   Severe sepsis with acute organ dysfunction ([place]) Patient hypotensive on presentation, elevated BUN and bacteremia due to Enterobacter.  Sepsis currently resolved. Secondary to UTI.  Urine culture unfortunately not sent on admission and patient had already received IV antibiotic.  Was sent after antibiotic given.  (Growing GNR).  blood culture growing Enterobacter.  Continue empiric Rocephin 2 g daily.  Pending sensitivity.  Active Problems: Toxic metabolic encephalopathy Has severe underlying dementia (moderate to severe) with worsened encephalopathy due to sepsis.  Patient restless trying to get out of bed.  Encephalopathy has resolved and in baseline severe dementia. Low-dose Haldol as needed for agitation/restlessness.  Near syncope Noted at the facility. Orthostatic secondary to hypotension and sepsis.  Blood  pressure improved with fluids.  Anemia, unspecified Noted for drop in hemoglobin of almost 2 g since admission.  No recent baseline in the system.  Check stool for Hemoccult, iron panel suggest iron deficiency.  Check B12.   Code Status : DNR  Family Communication  : Discussed with sister and nephew on admission.  Disposition Plan  : Possibly return to ALF in the next 24-48 hours once final culture and sensitivity obtained.  Barriers For Discharge :  pending final  culture and sensitivity  Consults  : None  Procedures  : CT head  DVT Prophylaxis  : Subcu Lovenox  Lab Results  Component Value Date   PLT 112 (L) 12/14/2019    Antibiotics  :    Anti-infectives (From admission, onward)   Start     Dose/Rate Route Frequency Ordered Stop   12/13/19 1800  cefTRIAXone (ROCEPHIN) 1 g in sodium chloride 0.9 % 100 mL IVPB  Status:  Discontinued     1 g 200 mL/hr over 30 Minutes Intravenous Every 24 hours 12/12/19 1734 12/13/19 1000   12/13/19 1200  cefTRIAXone (ROCEPHIN) 2 g in sodium chloride 0.9 % 100 mL IVPB     2 g 200 mL/hr over 30 Minutes Intravenous Every 24 hours 12/13/19 1000     12/12/19 1715  cefTRIAXone (ROCEPHIN) 1 g in sodium chloride 0.9 % 100 mL IVPB     1 g 200 mL/hr over 30 Minutes Intravenous  Once 12/12/19 1709 12/12/19 1802        Objective:   Vitals:   12/13/19 0535 12/13/19 1340 12/13/19 1924 12/14/19 0417  BP:  (!) 107/57 116/60 112/61  Pulse: 71 85 79 74  Resp:  18 20 20   Temp: 99.6 F (37.6 C) 98 F (36.7 C) 100 F (37.8 C) 99.6 F (37.6 C)  TempSrc: Oral Oral Oral Oral  SpO2: 98% 99% 93% 92%  Weight:      Height:        Wt Readings from Last 3 Encounters:  12/12/19 53 kg  11/18/18 55.2 kg  09/18/18 55.3 kg     Intake/Output Summary (Last 24 hours) at 12/14/2019 1243 Last data filed at 12/14/2019 0924 Gross per 24 hour  Intake 1629.59 ml  Output 800 ml  Net 829.59 ml   Physical exam Confused, restless in bed, confused HEENT: Moist  mucosa, supple neck Chest: Clear CVs: Normal S1-S2 GI: Soft, nontender, nondistended Musculoskeletal: Warm, no edema       Data Review:    CBC Recent Labs  Lab 12/12/19 1436 12/14/19 0401  WBC 7.7 9.0  HGB 10.4* 8.4*  HCT 33.1* 25.9*  PLT 155 112*  MCV 91.7 89.6  MCH 28.8 29.1  MCHC 31.4 32.4  RDW 13.1 13.3  LYMPHSABS 0.2*  --   MONOABS 0.4  --   EOSABS 0.0  --   BASOSABS 0.0  --     Chemistries  Recent Labs  Lab 12/12/19 1436 12/13/19 0415 12/14/19 0401  NA 140 142 139  K 3.8 3.9 3.4*  CL 100 108 109  CO2 29 28 26   GLUCOSE 142* 135* 103*  BUN 25* 28* 20  CREATININE 0.79 0.76 0.60  CALCIUM 9.3 8.5* 8.2*  AST 20  --   --   ALT 13  --   --   ALKPHOS 76  --   --   BILITOT 0.8  --   --    ------------------------------------------------------------------------------------------------------------------ No results for input(s): CHOL, HDL, LDLCALC, TRIG, CHOLHDL, LDLDIRECT in the last 72 hours.  Lab Results  Component Value Date   HGBA1C 5.6 11/28/2016   ------------------------------------------------------------------------------------------------------------------ No results for input(s): TSH, T4TOTAL, T3FREE, THYROIDAB in the last 72 hours.  Invalid input(s): FREET3 ------------------------------------------------------------------------------------------------------------------ Recent Labs    12/14/19 0401  FERRITIN 220  TIBC 144*  IRON 9*    Coagulation profile No results for input(s): INR, PROTIME in the last 168 hours.  No results for input(s): DDIMER in the last 72 hours.  Cardiac Enzymes No  results for input(s): CKMB, TROPONINI, MYOGLOBIN in the last 168 hours.  Invalid input(s): CK ------------------------------------------------------------------------------------------------------------------ No results found for: BNP  Inpatient Medications  Scheduled Meds: . cholecalciferol  2,000 Units Oral Daily  . enoxaparin (LOVENOX)  injection  40 mg Subcutaneous Q24H  . fluticasone  1 spray Each Nare Daily  . polyvinyl alcohol  1 drop Both Eyes BID PC  . sodium chloride flush  3 mL Intravenous Q12H   Continuous Infusions: . cefTRIAXone (ROCEPHIN)  IV 2 g (12/14/19 1202)   PRN Meds:.acetaminophen **OR** acetaminophen, haloperidol lactate, ondansetron **OR** ondansetron (ZOFRAN) IV  Micro Results Recent Results (from the past 240 hour(s))  Culture, blood (routine x 2)     Status: Abnormal (Preliminary result)   Collection Time: 12/12/19  2:39 PM   Specimen: BLOOD  Result Value Ref Range Status   Specimen Description   Final    BLOOD BLOOD LEFT WRIST Performed at [facility], [address], [place], [place] [address]    Special Requests   Final    BOTTLES DRAWN AEROBIC AND ANAEROBIC Blood Culture adequate volume Performed at [facility], [address] [place], [place] [address]    Culture  Setup Time   Final    GRAM NEGATIVE RODS AEROBIC BOTTLE ONLY CRITICAL RESULT CALLED TO, READ BACK BY AND VERIFIED WITH: CHRISTINE KATSOUDAS AT [place] ON 12/13/2019 [place].    Culture (A)  Final    KLEBSIELLA PNEUMONIAE SUSCEPTIBILITIES TO FOLLOW Performed at [facility], [place] [address] [place], [place] [address]    Report Status PENDING  Incomplete  Blood Culture ID Panel (Reflexed)     Status: Abnormal   Collection Time: 12/12/19  2:39 PM  Result Value Ref Range Status   Enterococcus species NOT DETECTED NOT DETECTED Final   Listeria monocytogenes NOT DETECTED NOT DETECTED Final   Staphylococcus species NOT DETECTED NOT DETECTED Final   Staphylococcus aureus (BCID) NOT DETECTED NOT DETECTED Final   Streptococcus species NOT DETECTED NOT DETECTED Final   Streptococcus agalactiae NOT DETECTED NOT DETECTED Final   Streptococcus pneumoniae NOT DETECTED NOT DETECTED Final   Streptococcus pyogenes NOT DETECTED NOT DETECTED Final   Acinetobacter baumannii NOT DETECTED NOT DETECTED Final    Enterobacteriaceae species DETECTED (A) NOT DETECTED Final    Comment: Enterobacteriaceae represent a large family of gram-negative bacteria, not a single organism. CRITICAL RESULT CALLED TO, READ BACK BY AND VERIFIED WITH: CHRISTINE KATSOUDAS AT [place] ON 12/13/2019 [place].    Enterobacter cloacae complex NOT DETECTED NOT DETECTED Final   Escherichia coli NOT DETECTED NOT DETECTED Final   Klebsiella oxytoca NOT DETECTED NOT DETECTED Final   Klebsiella pneumoniae DETECTED (A) NOT DETECTED Final    Comment: CRITICAL RESULT CALLED TO, READ BACK BY AND VERIFIED WITH: CHRISTINE KATSOUDAS AT 0945 ON 12/13/2019 [place].    Proteus species NOT DETECTED NOT DETECTED Final   Serratia marcescens NOT DETECTED NOT DETECTED Final   Carbapenem resistance NOT DETECTED NOT DETECTED Final   Haemophilus influenzae NOT DETECTED NOT DETECTED Final   Neisseria meningitidis NOT DETECTED NOT DETECTED Final   Pseudomonas aeruginosa NOT DETECTED NOT DETECTED Final   Candida albicans NOT DETECTED NOT DETECTED Final   Candida glabrata NOT DETECTED NOT DETECTED Final   Candida krusei NOT DETECTED NOT DETECTED Final   Candida parapsilosis NOT DETECTED NOT DETECTED Final   Candida tropicalis NOT DETECTED NOT DETECTED Final    Comment: Performed at [facility], [address] [place], [place] [address]  Culture, blood (routine x 2)     Status: None (Preliminary result)   Collection Time: 12/12/19  2:44 PM   Specimen: BLOOD  Result Value Ref Range Status   Specimen Description BLOOD LEFT ANTECUBITAL  Final   Special Requests   Final    BOTTLES DRAWN AEROBIC AND ANAEROBIC Blood Culture results may not be optimal due to an inadequate volume of blood received in culture bottles   Culture   Final    NO GROWTH 2 DAYS Performed at [facility], [address], [place], [place] [address]    Report Status PENDING  Incomplete  Urine Culture     Status: Abnormal (Preliminary result)   Collection Time: 12/12/19   3:39 PM   Specimen: Urine, Random  Result Value Ref Range Status   Specimen Description   Final    URINE, RANDOM Performed at [facility], [address] [place], [place] [address]    Special Requests   Final    NONE Performed at [facility], [address], [place], [place] [address]    Culture >=100,000 COLONIES/mL GRAM NEGATIVE RODS (A)  Final   Report Status PENDING  Incomplete    Radiology Reports CT Head Wo Contrast  Result Date: 12/12/2019 CLINICAL DATA:  Altered mental status. Near syncope. EXAM: CT HEAD WITHOUT CONTRAST TECHNIQUE: Contiguous axial images were obtained from the base of the skull through the vertex without intravenous contrast. COMPARISON:  07/02/2013 FINDINGS: Brain: No intracranial hemorrhage, mass effect, or midline shift. Normal for age atrophy. No hydrocephalus. The basilar cisterns are patent. Mild to moderate chronic small vessel ischemia. No evidence of territorial infarct or acute ischemia. No extra-axial or intracranial fluid collection. Vascular: Atherosclerosis of skullbase vasculature without hyperdense vessel or abnormal calcification. Skull: No fracture or focal lesion. Sinuses/Orbits: Mucosal thickening and partial opacification of right frontal sinus. Paranasal sinuses are otherwise clear. Mastoid air cells are clear. Orbits are unremarkable. Post cataract resection Other: None. IMPRESSION: 1. No acute intracranial abnormality. 2. Mild chronic small vessel ischemia. Electronically Signed   By: Keith Rake M.D.   On: 12/12/2019 16:19   DG Chest Portable 1 View  Result Date: 12/12/2019 CLINICAL DATA:  Fever and near syncope EXAM: PORTABLE CHEST 1 VIEW COMPARISON:  April 03, 2014 FINDINGS: No edema or airspace opacity. Heart size and pulmonary vascularity are normal. No adenopathy. There is aortic atherosclerosis. There is degenerative change in each shoulder. IMPRESSION: No edema or airspace opacity. Cardiac silhouette within  normal limits. Aortic Atherosclerosis (ICD10-I70.0). Electronically Signed   By: Lowella Grip III M.D.   On: 12/12/2019 14:56    Time Spent in minutes 25   Sanjana Folz M.D on 12/14/2019 at 12:43 PM  Between 7am to 7pm - Pager - [phone]  After 7pm go to [email or web address] - password [facility]  [facility] -  Office  [phone]

## 2019-12-15 LAB — CBC
HCT: 31 % — ABNORMAL LOW (ref 36.0–46.0)
Hemoglobin: 10.2 g/dL — ABNORMAL LOW (ref 12.0–15.0)
MCH: 29.3 pg (ref 26.0–34.0)
MCHC: 32.9 g/dL (ref 30.0–36.0)
MCV: 89.1 fL (ref 80.0–100.0)
Platelets: 149 10*3/uL — ABNORMAL LOW (ref 150–400)
RBC: 3.48 MIL/uL — ABNORMAL LOW (ref 3.87–5.11)
RDW: 13 % (ref 11.5–15.5)
WBC: 7.9 10*3/uL (ref 4.0–10.5)
nRBC: 0 % (ref 0.0–0.2)

## 2019-12-15 LAB — CULTURE, BLOOD (ROUTINE X 2): Special Requests: ADEQUATE

## 2019-12-15 LAB — URINE CULTURE: Culture: >=100000 — AB

## 2019-12-15 MED ORDER — CYANOCOBALAMIN 1000 MCG/ML IJ SOLN
1000.0000 ug | Freq: Once | INTRAMUSCULAR | Status: AC
  Administered 2019-12-15: 1000 ug via INTRAMUSCULAR
  Filled 2019-12-15: qty 1

## 2019-12-15 MED ORDER — POTASSIUM CHLORIDE CRYS ER 20 MEQ PO TBCR: 40.0000 meq | EXTENDED_RELEASE_TABLET | Freq: Once | ORAL | Status: DC

## 2019-12-15 MED ORDER — VITAMIN B-12 1000 MCG PO TABS
1000.0000 ug | ORAL_TABLET | Freq: Every day | ORAL | Status: DC
  Administered 2019-12-15 – 2019-12-16 (×2): 1000 ug via ORAL
  Filled 2019-12-15 (×2): qty 1

## 2019-12-15 MED ORDER — POLYETHYLENE GLYCOL 3350 17 G PO PACK
17.0000 g | PACK | Freq: Every day | ORAL | Status: DC
  Administered 2019-12-15 – 2019-12-16 (×2): 17 g via ORAL
  Filled 2019-12-15 (×2): qty 1

## 2019-12-15 NOTE — Progress Notes (Signed)
PROGRESS NOTE    Brandi Hanson  [id number] DOB: Jan 30, 1936 DOA: 12/12/2019 PCP: System, Pcp Not In   Brief Narrative:  84 year old severely demented female with history of hypertension, GERD resident of assisted living presented with generalized weakness and acute change in her mental status.  Patient found to be in severe sepsis with fever of 104.5 F, hypotension and tachypnea.  UA suggestive of UTI.  Given IV fluid bolus and empiric antibiotic and admitted for further management.  Subjective:   Assessment & Plan:   Principal Problem:   Severe sepsis with acute organ dysfunction (HCC) Active Problems:   Depression, major, recurrent, mild (HCC)   Aortic atherosclerosis (HCC)   Severe sepsis (HCC)   Dementia without behavioral disturbance (HCC)   Toxic metabolic encephalopathy   Near syncope  Severe sepsis with acute organ dysfunction (HCC)/Klebsiella pneumonia bacteremia.  Sepsis currently resolved.  Most likely secondary to UTI. Patient hypotensive on presentation, elevated BUN and bacteremia due to Klebsiella pneumonia.  Improving leukocytosis and fever curve trending down. Urine and blood culture both with Klebsiella pneumonia with good sensitivity. -Continue ceftriaxone.  Toxic metabolic encephalopathy Has severe underlying dementia (moderate to severe) with worsened encephalopathy due to sepsis.  Encephalopathy has resolved and in baseline with  severe dementia. Low-dose Haldol as needed for agitation/restlessness.  Near syncope.  Noted at the facility. Orthostatic secondary to hypotension and sepsis.  Blood pressure improved with fluids.  Anemia, unspecified Hemoglobin dropped to 8.4 yesterday ??  Improved back to 10.2 today without any intervention.  Most likely a lab error. Iron studies with normal iron and low TIBC with low iron, more consistent with anemia of chronic disease. B12 low at 175. -Give her a dose of IM B12 1000 mg. -Start her on oral  replacement.  Objective: Vitals:   12/13/19 1924 12/14/19 0417 12/14/19 1942 12/15/19 0429  BP: 116/60 112/61 115/63 134/60  Pulse: 79 74 73 66  Resp: 20 20 20 20   Temp: 100 F (37.8 C) 99.6 F (37.6 C) 98.3 F (36.8 C) 97.7 F (36.5 C)  TempSrc: Oral Oral Oral Oral  SpO2: 93% 92% 97% 99%  Weight:      Height:        Intake/Output Summary (Last 24 hours) at 12/15/2019 1452 Last data filed at 12/15/2019 1345 Gross per 24 hour  Intake 700 ml  Output 2450 ml  Net -1750 ml   Filed Weights   12/12/19 1434 12/12/19 2126  Weight: 59 kg 53 kg    Examination:  General exam: Appears calm and comfortable, oriented to name only.  Minimally communicative but following simple commands, eating breakfast with the help of sitter. Respiratory system: Clear to auscultation. Respiratory effort normal. Cardiovascular system: S1 & S2 heard, RRR. No JVD, murmurs, rubs, gallops or clicks. Gastrointestinal system: Soft, nontender, nondistended, bowel sounds positive. Central nervous system: Alert and oriented. No focal neurological deficits.Symmetric 5 x 5 power. Extremities: No edema, no cyanosis, pulses intact and symmetrical. Psychiatry: Judgement and insight appear impaired.   DVT prophylaxis: Lovenox Code Status: DNR Family Communication: Milon Dikes was updated on phone. Disposition Plan: Seems improving.  At baseline now.  Should be able to go back to her facility tomorrow.  Consultants:   None  Procedures:  Antimicrobials:  Ceftriaxone  Data Reviewed: I have personally reviewed following labs and imaging studies  CBC: Recent Labs  Lab 12/12/19 1436 12/14/19 0401 12/15/19 0408  WBC 7.7 9.0 7.9  NEUTROABS 7.1  --   --  HGB 10.4* 8.4* 10.2*  HCT 33.1* 25.9* 31.0*  MCV 91.7 89.6 89.1  PLT 155 112* 123456*   Basic Metabolic Panel: Recent Labs  Lab 12/12/19 1436 12/13/19 0415 12/14/19 0401  NA 140 142 139  K 3.8 3.9 3.4*  CL 100 108 109  CO2 29 28 26   GLUCOSE 142*  135* 103*  BUN 25* 28* 20  CREATININE 0.79 0.76 0.60  CALCIUM 9.3 8.5* 8.2*   GFR: Estimated Creatinine Clearance: 46.1 mL/min (by C-G formula based on SCr of 0.6 mg/dL). Liver Function Tests: Recent Labs  Lab 12/12/19 1436  AST 20  ALT 13  ALKPHOS 76  BILITOT 0.8  PROT 6.7  ALBUMIN 3.6   No results for input(s): LIPASE, AMYLASE in the last 168 hours. No results for input(s): AMMONIA in the last 168 hours. Coagulation Profile: No results for input(s): INR, PROTIME in the last 168 hours. Cardiac Enzymes: No results for input(s): CKTOTAL, CKMB, CKMBINDEX, TROPONINI in the last 168 hours. BNP (last 3 results) No results for input(s): PROBNP in the last 8760 hours. HbA1C: No results for input(s): HGBA1C in the last 72 hours. CBG: No results for input(s): GLUCAP in the last 168 hours. Lipid Profile: No results for input(s): CHOL, HDL, LDLCALC, TRIG, CHOLHDL, LDLDIRECT in the last 72 hours. Thyroid Function Tests: No results for input(s): TSH, T4TOTAL, FREET4, T3FREE, THYROIDAB in the last 72 hours. Anemia Panel: Recent Labs    12/13/19 0415 12/14/19 0401  VITAMINB12 175*  --   FERRITIN  --  220  TIBC  --  144*  IRON  --  9*   Sepsis Labs: Recent Labs  Lab 12/12/19 1439 12/12/19 1649  LATICACIDVEN 1.9 1.5    Recent Results (from the past 240 hour(s))  Culture, blood (routine x 2)     Status: Abnormal   Collection Time: 12/12/19  2:39 PM   Specimen: BLOOD  Result Value Ref Range Status   Specimen Description   Final    BLOOD BLOOD LEFT WRIST Performed at [facility], [address] [place], [place] [address]    Special Requests   Final    BOTTLES DRAWN AEROBIC AND ANAEROBIC Blood Culture adequate volume Performed at [facility], [address] [place], [place] [address]    Culture  Setup Time   Final    GRAM NEGATIVE RODS AEROBIC BOTTLE ONLY CRITICAL RESULT CALLED TO, READ BACK BY AND VERIFIED WITH: CHRISTINE KATSOUDAS AT [place] ON  12/13/2019 [place]. Performed at [facility], [place] [address] [place], [place] [address]    Culture KLEBSIELLA PNEUMONIAE (A)  Final   Report Status 12/15/2019 FINAL  Final   Organism ID, Bacteria KLEBSIELLA PNEUMONIAE  Final      Susceptibility   Klebsiella pneumoniae - MIC*    AMPICILLIN RESISTANT Resistant     CEFAZOLIN <=4 SENSITIVE Sensitive     CEFEPIME <=0.12 SENSITIVE Sensitive     CEFTAZIDIME <=1 SENSITIVE Sensitive     CEFTRIAXONE <=0.25 SENSITIVE Sensitive     CIPROFLOXACIN <=0.25 SENSITIVE Sensitive     GENTAMICIN <=1 SENSITIVE Sensitive     IMIPENEM <=0.25 SENSITIVE Sensitive     TRIMETH/SULFA <=20 SENSITIVE Sensitive     AMPICILLIN/SULBACTAM <=2 SENSITIVE Sensitive     PIP/TAZO <=4 SENSITIVE Sensitive     * KLEBSIELLA PNEUMONIAE  Blood Culture ID Panel (Reflexed)     Status: Abnormal   Collection Time: 12/12/19  2:39 PM  Result Value Ref Range Status   Enterococcus species  NOT DETECTED NOT DETECTED Final   Listeria monocytogenes NOT DETECTED NOT DETECTED Final   Staphylococcus species NOT DETECTED NOT DETECTED Final   Staphylococcus aureus (BCID) NOT DETECTED NOT DETECTED Final   Streptococcus species NOT DETECTED NOT DETECTED Final   Streptococcus agalactiae NOT DETECTED NOT DETECTED Final   Streptococcus pneumoniae NOT DETECTED NOT DETECTED Final   Streptococcus pyogenes NOT DETECTED NOT DETECTED Final   Acinetobacter baumannii NOT DETECTED NOT DETECTED Final   Enterobacteriaceae species DETECTED (A) NOT DETECTED Final    Comment: Enterobacteriaceae represent a large family of gram-negative bacteria, not a single organism. CRITICAL RESULT CALLED TO, READ BACK BY AND VERIFIED WITH: CHRISTINE KATSOUDAS AT [place] ON 12/13/2019 [place].    Enterobacter cloacae complex NOT DETECTED NOT DETECTED Final   Escherichia coli NOT DETECTED NOT DETECTED Final   Klebsiella oxytoca NOT DETECTED NOT DETECTED Final   Klebsiella pneumoniae DETECTED (A) NOT DETECTED Final    Comment:  CRITICAL RESULT CALLED TO, READ BACK BY AND VERIFIED WITH: CHRISTINE KATSOUDAS AT 0945 ON 12/13/2019 [place].    Proteus species NOT DETECTED NOT DETECTED Final   Serratia marcescens NOT DETECTED NOT DETECTED Final   Carbapenem resistance NOT DETECTED NOT DETECTED Final   Haemophilus influenzae NOT DETECTED NOT DETECTED Final   Neisseria meningitidis NOT DETECTED NOT DETECTED Final   Pseudomonas aeruginosa NOT DETECTED NOT DETECTED Final   Candida albicans NOT DETECTED NOT DETECTED Final   Candida glabrata NOT DETECTED NOT DETECTED Final   Candida krusei NOT DETECTED NOT DETECTED Final   Candida parapsilosis NOT DETECTED NOT DETECTED Final   Candida tropicalis NOT DETECTED NOT DETECTED Final    Comment: Performed at [facility], [address] [place], [place] [address]  Culture, blood (routine x 2)     Status: None (Preliminary result)   Collection Time: 12/12/19  2:44 PM   Specimen: BLOOD  Result Value Ref Range Status   Specimen Description BLOOD LEFT ANTECUBITAL  Final   Special Requests   Final    BOTTLES DRAWN AEROBIC AND ANAEROBIC Blood Culture results may not be optimal due to an inadequate volume of blood received in culture bottles   Culture   Final    NO GROWTH 3 DAYS Performed at [facility], [address] [place], [place] [address]    Report Status PENDING  Incomplete  Urine Culture     Status: Abnormal   Collection Time: 12/12/19  3:39 PM   Specimen: Urine, Random  Result Value Ref Range Status   Specimen Description   Final    URINE, RANDOM Performed at [facility], [address], [place], [place] [address]    Special Requests   Final    NONE Performed at [facility], [address] [place], [place] [address]    Culture >=100,000 COLONIES/mL KLEBSIELLA PNEUMONIAE (A)  Final   Report Status 12/15/2019 FINAL  Final   Organism ID, Bacteria KLEBSIELLA PNEUMONIAE (A)  Final      Susceptibility   Klebsiella pneumoniae -  MIC*    AMPICILLIN RESISTANT Resistant     CEFAZOLIN <=4 SENSITIVE Sensitive     CEFTRIAXONE <=0.25 SENSITIVE Sensitive     CIPROFLOXACIN <=0.25 SENSITIVE Sensitive     GENTAMICIN <=1 SENSITIVE Sensitive     IMIPENEM <=0.25 SENSITIVE Sensitive     NITROFURANTOIN <=16 SENSITIVE Sensitive     TRIMETH/SULFA <=20 SENSITIVE Sensitive     AMPICILLIN/SULBACTAM 4 SENSITIVE Sensitive     PIP/TAZO <=4 SENSITIVE Sensitive     * >=  100,000 [place]     Radiology Studies: No results found.  Scheduled Meds: . cholecalciferol  2,000 Units Oral Daily  . enoxaparin (LOVENOX) injection  40 mg Subcutaneous Q24H  . feeding supplement (ENSURE ENLIVE)  237 mL Oral BID BM  . fluticasone  1 spray Each Nare Daily  . polyvinyl alcohol  1 drop Both Eyes BID PC  . sodium chloride flush  3 mL Intravenous Q12H   Continuous Infusions: . cefTRIAXone (ROCEPHIN)  IV 2 g (12/15/19 1129)     LOS: 3 days   Time spent: 40 minutes.  Lorella Nimrod, MD [facility]  If 7PM-7AM, please contact night-coverage [email or web address]  12/15/2019, 2:52 PM   This record has been created using Systems analyst. Errors have been sought and corrected,but may not always be located. Such creation errors do not reflect on the standard of care.

## 2019-12-15 NOTE — Care Management Important Message (Signed)
Important Message  Patient Details  Name: Brandi Hanson MRN: [id number] Date of Birth: 1936-05-03   Medicare Important Message Given:  Yes     [name] 12/15/2019, 11:12 AM

## 2019-12-15 NOTE — Progress Notes (Signed)
   12/15/19 1100  Clinical Encounter Type  Visited With Patient  Visit Type Initial  Referral From Chaplain  Consult/Referral To Chaplain  While rounding Chaplain stopped in to see patient. Chaplain noticed a stuffed bunny on the bed and talked about it. Patient did not seem to understand Chaplain. Nurse came in to hand meds and Chaplain ledt.

## 2019-12-15 NOTE — TOC Initial Note (Addendum)
Transition of Care [facility]) - Initial/Assessment Note    Patient Details  Name: Brandi Hanson MRN: [id number] Date of Birth: 1936/05/17  Transition of Care [facility]) CM/SW Contact:    Magnus Ivan, LCSW Phone Number: 12/15/2019, 3:28 PM  Clinical Narrative:              CSW was informed by MD that plan to discharge back to ALF tomorrow. CSW called and spoke with patient's sister [name]) who deferred to patient's nephew Elta Guadeloupe). Elta Guadeloupe reported he would like patient to go back to [name] at discharge if appropriate. He asked if patient would need to go to a SNF for rehab at discharge. He reported he is agreeable tot he Medical Team's recommendation.   CSW also called and spoke with [name]. Caylin reported patient's COVID test from 4/2 would be fine if patient is to discharge back to [facility] tomorrow, she said that patient would not need to be retested. Jestina reported patient has also had both COVID vaccinations already. Odell reported she also thinks patient may need SNF rehab at discharge if that is recommended by the Medical Team. CSW messaged Dr. Reesa Chew to inquire about PT/OT evaluations prior to discharge. CSW will continue to follow.    Expected Discharge Plan: Assisted Living Barriers to Discharge: Continued Medical Work up   Patient Goals and CMS Choice        Expected Discharge Plan and Services Expected Discharge Plan: Assisted Living       Living arrangements for the past 2 months: [place]                                      Prior Living Arrangements/Services Living arrangements for the past 2 months: [place] Lives with:: Facility Resident Patient language and need for interpreter reviewed:: Yes        Need for Family Participation in Patient Care: Yes (Comment) Care giver support system in place?: Yes (comment)   Criminal Activity/Legal Involvement Pertinent to Current Situation/Hospitalization: No  - Comment as needed  Activities of Daily Living Home Assistive Devices/Equipment: Walker (specify type) ADL Screening (condition at time of admission) Patient's cognitive ability adequate to safely complete daily activities?: No Is the patient deaf or have difficulty hearing?: No Does the patient have difficulty seeing, even when wearing glasses/contacts?: No Does the patient have difficulty concentrating, remembering, or making decisions?: Yes Patient able to express need for assistance with ADLs?: No Does the patient have difficulty dressing or bathing?: Yes Independently performs ADLs?: No Communication: Dependent Is this a change from baseline?: Pre-admission baseline Dressing (OT): Dependent Is this a change from baseline?: Pre-admission baseline Grooming: Dependent Is this a change from baseline?: Pre-admission baseline Feeding: Dependent Is this a change from baseline?: Pre-admission baseline Bathing: Dependent Is this a change from baseline?: Pre-admission baseline Toileting: Dependent Is this a change from baseline?: Pre-admission baseline In/Out Bed: Dependent Is this a change from baseline?: Pre-admission baseline Walks in Home: Dependent Is this a change from baseline?: Pre-admission baseline Does the patient have difficulty walking or climbing stairs?: Yes Weakness of Legs: Both Weakness of Arms/Hands: Both  Permission Sought/Granted Permission sought to share information with : Facility Art therapist granted to share information with : Yes, Verbal Permission Granted(Permission granted by family.)     Permission granted to share info w AGENCY: [name]  Emotional Assessment         Alcohol / Substance Use: Not Applicable Psych Involvement: No (comment)  Admission diagnosis:  Severe sepsis ([place]) [A41.9, R65.20] Severe sepsis with acute organ dysfunction ([place]) [A41.9, R65.20] Urinary tract infection without hematuria, site  unspecified [N39.0] Patient Active Problem List   Diagnosis Date Noted  . Severe sepsis ([place]) 12/12/2019  . Severe sepsis with acute organ dysfunction ([place]) 12/12/2019  . Dementia without behavioral disturbance ([place]) 12/12/2019  . Toxic metabolic encephalopathy [id number]  . Near syncope 12/12/2019  . Aortic atherosclerosis ([place]) 01/03/2017  . Renal calculi 01/03/2017  . Allergic rhinitis 07/07/2015  . MCI (mild cognitive impairment) with memory loss 03/25/2015  . Depression, major, recurrent, mild ([place]) 03/25/2015  . Generalized anxiety disorder 03/25/2015  . IBS (irritable bowel syndrome) 02/15/2015  . At risk for falling 01/08/2015  . Body mass index (BMI) of 23.0-23.9 in adult 01/08/2015  . H/O malignant neoplasm of breast 01/08/2015  . Chronic constipation 01/08/2015  . Breast cancer of upper-outer quadrant of left female breast ([place]) 01/08/2015  . HZV (herpes zoster virus) post herpetic neuralgia 01/08/2015  . Apnea, sleep 01/08/2015  . Palpitations 08/21/2013  . Benign essential HTN 01/02/2012  . Beat, premature ventricular 04/21/2009  . Absolute anemia 02/05/2009  . Acid reflux 02/05/2009  . Essential (primary) hypertension 02/05/2009  . Bone/cartilage disorder 07/22/2007  . Avitaminosis D 10/08/2006  . Carotid artery obstruction 09/11/1998   PCP:  System, Pcp Not In Pharmacy:   [place], [address] [place] [address] Phone: [phone] Fax: [phone]  [place], [place] [address] Phone: [phone] Fax: [phone]     Social Determinants of Health (SDOH) Interventions    Readmission Risk Interventions No flowsheet data found.

## 2019-12-16 LAB — CBC
HCT: 37.2 % (ref 36.0–46.0)
Hemoglobin: 12.3 g/dL (ref 12.0–15.0)
MCH: 29.1 pg (ref 26.0–34.0)
MCHC: 33.1 g/dL (ref 30.0–36.0)
MCV: 88.2 fL (ref 80.0–100.0)
Platelets: 216 10*3/uL (ref 150–400)
RBC: 4.22 MIL/uL (ref 3.87–5.11)
RDW: 13 % (ref 11.5–15.5)
WBC: 6.8 10*3/uL (ref 4.0–10.5)
nRBC: 0 % (ref 0.0–0.2)

## 2019-12-16 MED ORDER — AMOXICILLIN-POT CLAVULANATE 875-125 MG PO TABS: 1.0000 | ORAL_TABLET | Freq: Two times a day (BID) | ORAL | 0 refills | Status: AC

## 2019-12-16 MED ORDER — CYANOCOBALAMIN 1000 MCG PO TABS: 1000.0000 ug | ORAL_TABLET | Freq: Every day | ORAL | 0 refills | Status: AC

## 2019-12-16 MED ORDER — AMOXICILLIN-POT CLAVULANATE 875-125 MG PO TABS
1.0000 | ORAL_TABLET | Freq: Two times a day (BID) | ORAL | Status: DC
  Administered 2019-12-16: 1 via ORAL
  Filled 2019-12-16: qty 1

## 2019-12-16 MED ORDER — VITAMIN D3 25 MCG PO TABS: 2000.0000 [IU] | ORAL_TABLET | Freq: Every day | ORAL | 0 refills | Status: DC

## 2019-12-16 MED ORDER — POLYETHYLENE GLYCOL 3350 17 G PO PACK: 17.0000 g | PACK | Freq: Every day | ORAL | 0 refills | Status: AC

## 2019-12-16 MED ORDER — LORAZEPAM 0.5 MG PO TABS: 0.2500 mg | ORAL_TABLET | Freq: Two times a day (BID) | ORAL | 0 refills | Status: DC | PRN

## 2019-12-16 NOTE — Progress Notes (Signed)
Patient is being discharged to [facility] unit.  Elta Guadeloupe (nephew) at bedside and updated on plan of care and discharge instructions.  Report called to [place] at [facility] who verbalized understanding. No questions or concerns noted. EMS to transport patient. PIV x2 removed with tip intact prior to discharge. Patient assisted into her clothes brought by family for discharge. All belongings given to patient at transport.

## 2019-12-16 NOTE — Discharge Instructions (Signed)
Urinary Tract Infection, Adult A urinary tract infection (UTI) is an infection of any part of the urinary tract. The urinary tract includes:  The kidneys.  The ureters.  The bladder.  The urethra. These organs make, store, and get rid of pee (urine) in the body. What are the causes? This is caused by germs (bacteria) in your genital area. These germs grow and cause swelling (inflammation) of your urinary tract. What increases the risk? You are more likely to develop this condition if:  You have a small, thin tube (catheter) to drain pee.  You cannot control when you pee or poop (incontinence).  You are female, and: ? You use these methods to prevent pregnancy:  A medicine that kills sperm (spermicide).  A device that blocks sperm (diaphragm). ? You have low levels of a female hormone (estrogen). ? You are pregnant.  You have genes that add to your risk.  You are sexually active.  You take antibiotic medicines.  You have trouble peeing because of: ? A prostate that is bigger than normal, if you are female. ? A blockage in the part of your body that drains pee from the bladder (urethra). ? A kidney stone. ? A nerve condition that affects your bladder (neurogenic bladder). ? Not getting enough to drink. ? Not peeing often enough.  You have other conditions, such as: ? Diabetes. ? A weak disease-fighting system (immune system). ? Sickle cell disease. ? Gout. ? Injury of the spine. What are the signs or symptoms? Symptoms of this condition include:  Needing to pee right away (urgently).  Peeing often.  Peeing small amounts often.  Pain or burning when peeing.  Blood in the pee.  Pee that smells bad or not like normal.  Trouble peeing.  Pee that is cloudy.  Fluid coming from the vagina, if you are female.  Pain in the belly or lower back. Other symptoms include:  Throwing up (vomiting).  No urge to eat.  Feeling mixed up (confused).  Being tired  and grouchy (irritable).  A fever.  Watery poop (diarrhea). How is this treated? This condition may be treated with:  Antibiotic medicine.  Other medicines.  Drinking enough water. Follow these instructions at home:  Medicines  Take over-the-counter and prescription medicines only as told by your doctor.  If you were prescribed an antibiotic medicine, take it as told by your doctor. Do not stop taking it even if you start to feel better. General instructions  Make sure you: ? Pee until your bladder is empty. ? Do not hold pee for a long time. ? Empty your bladder after sex. ? Wipe from front to back after pooping if you are a female. Use each tissue one time when you wipe.  Drink enough fluid to keep your pee pale yellow.  Keep all follow-up visits as told by your doctor. This is important. Contact a doctor if:  You do not get better after 1-2 days.  Your symptoms go away and then come back. Get help right away if:  You have very bad back pain.  You have very bad pain in your lower belly.  You have a fever.  You are sick to your stomach (nauseous).  You are throwing up. Summary  A urinary tract infection (UTI) is an infection of any part of the urinary tract.  This condition is caused by germs in your genital area.  There are many risk factors for a UTI. These include having a small, thin   tube to drain pee and not being able to control when you pee or poop.  Treatment includes antibiotic medicines for germs.  Drink enough fluid to keep your pee pale yellow. This information is not intended to replace advice given to you by your health care provider. Make sure you discuss any questions you have with your health care provider. Document Revised: 08/15/2018 Document Reviewed: 03/07/2018 Elsevier Patient Education  2020 Elsevier Inc.  

## 2019-12-16 NOTE — Evaluation (Signed)
Physical Therapy Evaluation Patient Details Name: Brandi Hanson MRN: [id number] DOB: March 21, 1936 Today's Date: 12/16/2019   History of Present Illness  Brandi Hanson is an 84 year old female with history of dementia, hypertension, GERD resident of assisted living, who presented with generalized weakness and acute change in her mental status.  Patient found to be in severe sepsis with fever of 104.5 F, hypotension and tachypnea.  UA suggestive of UTI.  Given IV fluid bolus and empiric antibiotic and admitted for further management.  Clinical Impression  Pt is a pleasantly confused 84 year old female who was admitted for sepsis. Pt performs transfers and ambulation with mod assist. Pt appears familiar with RW, despite being a poor historian. Demonstrates improved mobility efforts with use of RW, although does still need constant hands on assist due to cognition. Pt demonstrates deficits with strength/cognition/endurance. Would benefit from skilled PT to address above deficits and promote optimal return to PLOF.  Currently recommending memory care for increased physical assist/nursing assist due to cognition/safety.     Follow Up Recommendations (memory care with HHPT)    Equipment Recommendations  (RW if pt doesn't not already have one available)    Recommendations for Other Services       Precautions / Restrictions Precautions Precautions: Fall Precaution Comments: High Fall      Mobility  Bed Mobility Overal bed mobility: Needs Assistance Bed Mobility: Supine to Sit     Supine to sit: Max assist;HOB elevated     General bed mobility comments: not performed as she was received seated at EOB.  Transfers Overall transfer level: Needs assistance Equipment used: None Transfers: Sit to/from Stand Sit to Stand: +2 physical assistance;Mod assist;Max assist         General transfer comment: mod/max assist due to poor balance and heavy post leaning. Once standing, able to  maintain balance with min assist. Further attempts performed with +1 assist, RW, and mod assist. Able to maintain standing safely with constant min assist.  Ambulation/Gait Ambulation/Gait assistance: Mod assist Gait Distance (Feet): 3 Feet Assistive device: None Gait Pattern/deviations: Step-to pattern     General Gait Details: Pt with poor initiation and needs slight propulsion to perform stepping strategy. Able to ambulate to recliner with mod assist and no AD. Further ambulation performed with RW.  Stairs            Wheelchair Mobility    Modified Rankin (Stroke Patients Only)       Balance Overall balance assessment: Needs assistance Sitting-balance support: Feet supported;Single extremity supported Sitting balance-Leahy Scale: Fair Sitting balance - Comments: Pt requires min A to CGA to maintain sitting balance this date.   Standing balance support: Bilateral upper extremity supported Standing balance-Leahy Scale: Poor Standing balance comment: heavy post leaning, needs physical assist to correct                             Pertinent Vitals/Pain Pain Assessment: No/denies pain Faces Pain Scale: No hurt Pain Intervention(s): Monitored during session    Home Living Family/patient expects to be discharged to:: Assisted living                 Additional Comments: Pt unable to provide information regarding home equipment/PLOF due to impaired cognition. Does shake head yes when asked if she's ever used a walker. Per chart,  she is a long-term resident at [name]    Prior Function Level of Independence: Needs assistance  Comments: Pt is poor historian, unsure of accurate history. Per notes, pt was previously wandering the hallway. Unsure if used an AD.     Hand Dominance   Dominant Hand: Right    Extremity/Trunk Assessment   Upper Extremity Assessment Upper Extremity Assessment: Generalized weakness;Difficult to assess due  to impaired cognition(grossly 3/5; able to reach overhead)    Lower Extremity Assessment Lower Extremity Assessment: Generalized weakness;Difficult to assess due to impaired cognition(grossly 3/5)       Communication   Communication: No difficulties  Cognition Arousal/Alertness: Awake/alert Behavior During Therapy: Flat affect;WFL for tasks assessed/performed Overall Cognitive Status: History of cognitive impairments - at baseline                                 General Comments: Pt very flat in interactions. She is pleasantly confused. Unable to initiate tasks      General Comments      Exercises Other Exercises Other Exercises: ambulated further in room using RW and mod assist. Needs assist to propel the RW, however then able to initiate stepping. Fatigues quickly and requires seated rest break after 5-8'. Then able to ambulate 5-8' back to recliner. Other Exercises: OT provides +2 assist for fxl mobility/transfers with PT this date. Other Exercises: attempted ther-ex, however pt unable to participate in ther-ex due to cognition. Attempted SLRs and SAQ.   Assessment/Plan    PT Assessment Patient needs continued PT services  PT Problem List Decreased strength;Decreased activity tolerance;Decreased balance;Decreased mobility;Decreased cognition       PT Treatment Interventions Gait training;Balance training;DME instruction    PT Goals (Current goals can be found in the Care Plan section)  Acute Rehab PT Goals Patient Stated Goal: Pt unable to state PT Goal Formulation: Patient unable to participate in goal setting Time For Goal Achievement: 12/30/19 Potential to Achieve Goals: Fair    Frequency Min 2X/week   Barriers to discharge        Co-evaluation PT/OT/SLP Co-Evaluation/Treatment: Yes Reason for Co-Treatment: For patient/therapist safety;To address functional/ADL transfers;Necessary to address cognition/behavior during functional activity PT goals  addressed during session: Mobility/safety with mobility;Balance;Proper use of DME OT goals addressed during session: ADL's and self-care;Proper use of Adaptive equipment and DME       AM-PAC PT "6 Clicks" Mobility  Outcome Measure Help needed turning from your back to your side while in a flat bed without using bedrails?: A Lot Help needed moving from lying on your back to sitting on the side of a flat bed without using bedrails?: A Lot Help needed moving to and from a bed to a chair (including a wheelchair)?: A Lot Help needed standing up from a chair using your arms (e.g., wheelchair or bedside chair)?: A Lot Help needed to walk in hospital room?: A Lot Help needed climbing 3-5 steps with a railing? : Total 6 Click Score: 11    End of Session Equipment Utilized During Treatment: Gait belt Activity Tolerance: Patient tolerated treatment well Patient left: in chair;with chair alarm set(left with OT) Nurse Communication: Mobility status PT Visit Diagnosis: Muscle weakness (generalized) (M62.81);Unsteadiness on feet (R26.81);Difficulty in walking, not elsewhere classified (R26.2)    Time: [id number] PT Time Calculation (min) (ACUTE ONLY): 18 min   Charges:   PT Evaluation $PT Eval Moderate Complexity: 1 Mod PT Treatments $Gait Training: 8-22 mins        Greggory Stallion, PT, DPT [phone]   Brandi Hanson 12/16/2019, 1:31 PM

## 2019-12-16 NOTE — Progress Notes (Signed)
Patient discharged to [facility] via EMS transport.

## 2019-12-16 NOTE — TOC Transition Note (Addendum)
Transition of Care [facility]) - CM/SW Discharge Note   Patient Details  Name: Brandi Hanson MRN: [id number] Date of Birth: 1936/07/07  [facility]) CM/SW Contact:  Magnus Ivan, LCSW Phone Number: 12/16/2019, 3:17 PM   Clinical Narrative:   Patient has orders to discharge. Patient is discharging to [name] in their Memory Care Unit. Updated FL2 faxed to Laqueta Linden at [place] [facility]. Home Health PT and OT arranged with [facility]. CSW informed patient's nephew Brandi Hanson, [facility] Representative [name], and [facility]	[place] [facility] of discharge. CSW called and arranged nonemergent EMS transport. EMS paperwork is by the chart. RN [name] aware. No additional needs identified. CSW signing off.    Final next level of care: Memory Care(with [facility]) Barriers to Discharge: Barriers Resolved   Patient Goals and CMS Choice     Choice offered to / list presented to : (nephew Brandi Hanson)  Discharge Placement              Patient chooses bed at: [facility]) Patient to be transferred to facility by: Nonemergent EMS Name of family member notified: Brandi Hanson - nephew Patient and family notified of of transfer: 12/16/19  Discharge Plan and Services                          HH Arranged: PT, OT [facility] Agency: [facility] Tea Date [place]: 12/16/19   Representative spoke with at [place]: [place] ([place]) Interventions     Readmission Risk Interventions No flowsheet data found.

## 2019-12-16 NOTE — Discharge Summary (Signed)
Physician Discharge Summary  Brandi Hanson [id number] DOB: 12-10-1935 DOA: 12/12/2019  PCP: System, Pcp Not In  Admit date: 12/12/2019 Discharge date: 12/16/2019  Admitted From: ALF Disposition: ALF  Recommendations for Outpatient Follow-up:  1. Follow up with PCP in 1-2 weeks 2. Please obtain BMP/CBC in one week 3. Please follow up on the following pending results: None  Home Health: Yes Equipment/Devices: Rolling walker Discharge Condition: Fair CODE STATUS: DNR Diet recommendation: Heart Healthy   Brief/Interim Summary: 84 year old severely demented female with history of hypertension, GERD resident of assisted living presented with generalized weakness and acute change in her mental status. Patient found to be in severe sepsis with fever of 104.5 F, hypotension and tachypnea. UA suggestive of UTI.  Urine and blood culture grew Klebsiella pneumonia with good sensitivity.  She was initially treated with empiric broad-spectrum antibiotics which were later changed with ceftriaxone and she was discharge on Augmentin for 4 more days to complete a 7-day course.  Patient has severe underlying dementia which worsened due to sepsis.  Sepsis resolved and her mentation was at baseline on discharge. She is on multiple psych meds which she will continue and follow-up with her primary care physician for further management.  She was also found to have low B12 at 175, given one-time dose of IM B12 and discharged home on p.o. 1000 mg daily.  Iron studies were consistent with anemia of chronic disease.  Discharge Diagnoses:  Principal Problem:   Severe sepsis with acute organ dysfunction (HCC) Active Problems:   Depression, major, recurrent, mild (HCC)   Aortic atherosclerosis (HCC)   Severe sepsis (HCC)   Dementia without behavioral disturbance (HCC)   Toxic metabolic encephalopathy   Near syncope  Discharge Instructions  Discharge Instructions    Diet - low sodium heart healthy    Complete by: As directed    Increase activity slowly   Complete by: As directed      Allergies as of 12/16/2019      Reactions   Sulfa Antibiotics Nausea Only      Medication List    STOP taking these medications   Vitamin D3 25 MCG (1000 UT) Caps Replaced by: Vitamin D3 25 MCG tablet     TAKE these medications   acetaminophen 325 MG tablet Commonly known as: TYLENOL Take 650 mg by mouth in the morning and at bedtime.   amoxicillin-clavulanate 875-125 MG tablet Commonly known as: AUGMENTIN Take 1 tablet by mouth every 12 (twelve) hours for 4 days.   Calcium 600+D 600-800 MG-UNIT Tabs Generic drug: Calcium Carb-Cholecalciferol Take 1 tablet by mouth in the morning and at bedtime.   cyanocobalamin 1000 MCG tablet Take 1 tablet (1,000 mcg total) by mouth daily. Start taking on: December 17, 2019   donepezil 10 MG tablet Commonly known as: ARICEPT TAKE 1 TABLET BY MOUTH AT BEDTIME   fluticasone 50 MCG/ACT nasal spray Commonly known as: FLONASE Place 1 spray into both nostrils daily.   loratadine 10 MG tablet Commonly known as: CLARITIN Take 10 mg by mouth daily.   LORazepam 0.5 MG tablet Commonly known as: ATIVAN Take 0.5 tablets (0.25 mg total) by mouth 2 (two) times daily as needed for anxiety or sleep. What changed:   when to take this  reasons to take this   melatonin 3 MG Tabs tablet Take 3 mg by mouth at bedtime.   memantine 10 MG tablet Commonly known as: NAMENDA Take 10 mg by mouth 2 (two) times daily.   ondansetron  8 MG tablet Commonly known as: ZOFRAN Take 8 mg by mouth every 8 (eight) hours as needed for nausea or vomiting.   polyethylene glycol 17 g packet Commonly known as: MIRALAX / GLYCOLAX Take 17 g by mouth daily. Start taking on: December 17, 2019   propranolol 20 MG tablet Commonly known as: INDERAL Take 1 tablet (20 mg total) by mouth 2 (two) times daily.   risperiDONE 0.25 MG tablet Commonly known as: RISPERDAL Take 0.25 mg by mouth  at bedtime.   sertraline 100 MG tablet Commonly known as: ZOLOFT Take 150 mg by mouth daily.   SYSTANE COMPLETE OP Place 1 drop into both eyes in the morning and at bedtime.   Vitamin D3 25 MCG tablet Commonly known as: Vitamin D Take 2 tablets (2,000 Units total) by mouth daily. Start taking on: December 17, 2019 Replaces: Vitamin D3 25 MCG (1000 UT) Caps       Allergies  Allergen Reactions  . Sulfa Antibiotics Nausea Only    Consultations:  None  Procedures/Studies: CT Head Wo Contrast  Result Date: 12/12/2019 CLINICAL DATA:  Altered mental status. Near syncope. EXAM: CT HEAD WITHOUT CONTRAST TECHNIQUE: Contiguous axial images were obtained from the base of the skull through the vertex without intravenous contrast. COMPARISON:  07/02/2013 FINDINGS: Brain: No intracranial hemorrhage, mass effect, or midline shift. Normal for age atrophy. No hydrocephalus. The basilar cisterns are patent. Mild to moderate chronic small vessel ischemia. No evidence of territorial infarct or acute ischemia. No extra-axial or intracranial fluid collection. Vascular: Atherosclerosis of skullbase vasculature without hyperdense vessel or abnormal calcification. Skull: No fracture or focal lesion. Sinuses/Orbits: Mucosal thickening and partial opacification of right frontal sinus. Paranasal sinuses are otherwise clear. Mastoid air cells are clear. Orbits are unremarkable. Post cataract resection Other: None. IMPRESSION: 1. No acute intracranial abnormality. 2. Mild chronic small vessel ischemia. Electronically Signed   By: Keith Rake M.D.   On: 12/12/2019 16:19   DG Chest Portable 1 View  Result Date: 12/12/2019 CLINICAL DATA:  Fever and near syncope EXAM: PORTABLE CHEST 1 VIEW COMPARISON:  April 03, 2014 FINDINGS: No edema or airspace opacity. Heart size and pulmonary vascularity are normal. No adenopathy. There is aortic atherosclerosis. There is degenerative change in each shoulder. IMPRESSION: No edema  or airspace opacity. Cardiac silhouette within normal limits. Aortic Atherosclerosis (ICD10-I70.0). Electronically Signed   By: Lowella Grip III M.D.   On: 12/12/2019 14:56    Subjective: No overnight issues.  Patient is minimally communicative and oriented to name only.  No new complaints.  Discharge Exam: Vitals:   12/16/19 0549 12/16/19 1222  BP: (!) 148/73 128/68  Pulse: 71 71  Resp: 16 16  Temp: 98.8 F (37.1 C) 97.8 F (36.6 C)  SpO2: 100% 99%   Vitals:   12/15/19 0429 12/15/19 1918 12/16/19 0549 12/16/19 1222  BP: 134/60 136/79 (!) 148/73 128/68  Pulse: 66 82 71 71  Resp: 20 16 16 16   Temp: 97.7 F (36.5 C) 97.8 F (36.6 C) 98.8 F (37.1 C) 97.8 F (36.6 C)  TempSrc: Oral Oral Oral Oral  SpO2: 99% 100% 100% 99%  Weight:      Height:        General: Pt is alert, awake, oriented to name only, not in acute distress Cardiovascular: RRR, S1/S2 +, no rubs, no gallops Respiratory: CTA bilaterally, no wheezing, no rhonchi Abdominal: Soft, NT, ND, bowel sounds + Extremities: no edema, no cyanosis   The results of significant diagnostics  from this hospitalization (including imaging, microbiology, ancillary and laboratory) are listed below for reference.    Microbiology: Recent Results (from the past 240 hour(s))  Culture, blood (routine x 2)     Status: Abnormal   Collection Time: 12/12/19  2:39 PM   Specimen: BLOOD  Result Value Ref Range Status   Specimen Description   Final    BLOOD BLOOD LEFT WRIST Performed at [facility], [address], [place], [place] [address]    Special Requests   Final    BOTTLES DRAWN AEROBIC AND ANAEROBIC Blood Culture adequate volume Performed at [facility], [address] [name], [place] [address]    Culture  Setup Time   Final    GRAM NEGATIVE RODS AEROBIC BOTTLE ONLY CRITICAL RESULT CALLED TO, READ BACK BY AND VERIFIED WITH: CHRISTINE KATSOUDAS AT [place] ON 12/13/2019 [place]. Performed at [facility], [place] [address], [place], [place] [address]    Culture KLEBSIELLA PNEUMONIAE (A)  Final   Report Status 12/15/2019 FINAL  Final   Organism ID, Bacteria KLEBSIELLA PNEUMONIAE  Final      Susceptibility   Klebsiella pneumoniae - MIC*    AMPICILLIN RESISTANT Resistant     CEFAZOLIN <=4 SENSITIVE Sensitive     CEFEPIME <=0.12 SENSITIVE Sensitive     CEFTAZIDIME <=1 SENSITIVE Sensitive     CEFTRIAXONE <=0.25 SENSITIVE Sensitive     CIPROFLOXACIN <=0.25 SENSITIVE Sensitive     GENTAMICIN <=1 SENSITIVE Sensitive     IMIPENEM <=0.25 SENSITIVE Sensitive     TRIMETH/SULFA <=20 SENSITIVE Sensitive     AMPICILLIN/SULBACTAM <=2 SENSITIVE Sensitive     PIP/TAZO <=4 SENSITIVE Sensitive     * KLEBSIELLA PNEUMONIAE  Blood Culture ID Panel (Reflexed)     Status: Abnormal   Collection Time: 12/12/19  2:39 PM  Result Value Ref Range Status   Enterococcus species NOT DETECTED NOT DETECTED Final   Listeria monocytogenes NOT DETECTED NOT DETECTED Final   Staphylococcus species NOT DETECTED NOT DETECTED Final   Staphylococcus aureus (BCID) NOT DETECTED NOT DETECTED Final   Streptococcus species NOT DETECTED NOT DETECTED Final   Streptococcus agalactiae NOT DETECTED NOT DETECTED Final   Streptococcus pneumoniae NOT DETECTED NOT DETECTED Final   Streptococcus pyogenes NOT DETECTED NOT DETECTED Final   Acinetobacter baumannii NOT DETECTED NOT DETECTED Final   Enterobacteriaceae species DETECTED (A) NOT DETECTED Final    Comment: Enterobacteriaceae represent a large family of gram-negative bacteria, not a single organism. CRITICAL RESULT CALLED TO, READ BACK BY AND VERIFIED WITH: CHRISTINE KATSOUDAS AT [place] ON 12/13/2019 [place].    Enterobacter cloacae complex NOT DETECTED NOT DETECTED Final   Escherichia coli NOT DETECTED NOT DETECTED Final   Klebsiella oxytoca NOT DETECTED NOT DETECTED Final   Klebsiella pneumoniae DETECTED (A) NOT DETECTED Final    Comment: CRITICAL RESULT CALLED TO, READ BACK BY  AND VERIFIED WITH: CHRISTINE KATSOUDAS AT 0945 ON 12/13/2019 [place].    Proteus species NOT DETECTED NOT DETECTED Final   Serratia marcescens NOT DETECTED NOT DETECTED Final   Carbapenem resistance NOT DETECTED NOT DETECTED Final   Haemophilus influenzae NOT DETECTED NOT DETECTED Final   Neisseria meningitidis NOT DETECTED NOT DETECTED Final   Pseudomonas aeruginosa NOT DETECTED NOT DETECTED Final   Candida albicans NOT DETECTED NOT DETECTED Final   Candida glabrata NOT DETECTED NOT DETECTED Final   Candida krusei NOT DETECTED NOT DETECTED Final   Candida parapsilosis NOT DETECTED NOT DETECTED Final   Candida tropicalis NOT  DETECTED NOT DETECTED Final    Comment: Performed at [facility], [address] [place], [place] [address]  Culture, blood (routine x 2)     Status: None (Preliminary result)   Collection Time: 12/12/19  2:44 PM   Specimen: BLOOD  Result Value Ref Range Status   Specimen Description BLOOD LEFT ANTECUBITAL  Final   Special Requests   Final    BOTTLES DRAWN AEROBIC AND ANAEROBIC Blood Culture results may not be optimal due to an inadequate volume of blood received in culture bottles   Culture   Final    NO GROWTH 4 DAYS Performed at [facility], [address], [place], [place] [address]    Report Status PENDING  Incomplete  Urine Culture     Status: Abnormal   Collection Time: 12/12/19  3:39 PM   Specimen: Urine, Random  Result Value Ref Range Status   Specimen Description   Final    URINE, RANDOM Performed at [facility], [address] [place], [place] [address]    Special Requests   Final    NONE Performed at [facility], [address] [place], [place] [address]    Culture >=100,000 COLONIES/mL KLEBSIELLA PNEUMONIAE (A)  Final   Report Status 12/15/2019 FINAL  Final   Organism ID, Bacteria KLEBSIELLA PNEUMONIAE (A)  Final      Susceptibility   Klebsiella pneumoniae - MIC*    AMPICILLIN RESISTANT Resistant      CEFAZOLIN <=4 SENSITIVE Sensitive     CEFTRIAXONE <=0.25 SENSITIVE Sensitive     CIPROFLOXACIN <=0.25 SENSITIVE Sensitive     GENTAMICIN <=1 SENSITIVE Sensitive     IMIPENEM <=0.25 SENSITIVE Sensitive     NITROFURANTOIN <=16 SENSITIVE Sensitive     TRIMETH/SULFA <=20 SENSITIVE Sensitive     AMPICILLIN/SULBACTAM 4 SENSITIVE Sensitive     PIP/TAZO <=4 SENSITIVE Sensitive     * >=100,000 COLONIES/mL KLEBSIELLA PNEUMONIAE     Labs: BNP (last 3 results) No results for input(s): BNP in the last 8760 hours. Basic Metabolic Panel: Recent Labs  Lab 12/12/19 1436 12/13/19 0415 12/14/19 0401  NA 140 142 139  K 3.8 3.9 3.4*  CL 100 108 109  CO2 29 28 26   GLUCOSE 142* 135* 103*  BUN 25* 28* 20  CREATININE 0.79 0.76 0.60  CALCIUM 9.3 8.5* 8.2*   Liver Function Tests: Recent Labs  Lab 12/12/19 1436  AST 20  ALT 13  ALKPHOS 76  BILITOT 0.8  PROT 6.7  ALBUMIN 3.6   No results for input(s): LIPASE, AMYLASE in the last 168 hours. No results for input(s): AMMONIA in the last 168 hours. CBC: Recent Labs  Lab 12/12/19 1436 12/14/19 0401 12/15/19 0408 12/16/19 1035  WBC 7.7 9.0 7.9 6.8  NEUTROABS 7.1  --   --   --   HGB 10.4* 8.4* 10.2* 12.3  HCT 33.1* 25.9* 31.0* 37.2  MCV 91.7 89.6 89.1 88.2  PLT 155 112* 149* 216   Cardiac Enzymes: No results for input(s): CKTOTAL, CKMB, CKMBINDEX, TROPONINI in the last 168 hours. BNP: Invalid input(s): POCBNP CBG: No results for input(s): GLUCAP in the last 168 hours. D-Dimer No results for input(s): DDIMER in the last 72 hours. Hgb A1c No results for input(s): HGBA1C in the last 72 hours. Lipid Profile No results for input(s): CHOL, HDL, LDLCALC, TRIG, CHOLHDL, LDLDIRECT in the last 72 hours. Thyroid function studies No results for input(s): TSH, T4TOTAL, T3FREE, THYROIDAB in the last 72 hours.  Invalid input(s): FREET3 Anemia work up Recent Labs    12/14/19 0401  FERRITIN 220  TIBC 144*  IRON 9*   Urinalysis     Component Value Date/Time   COLORURINE YELLOW (A) 12/12/2019 1539   APPEARANCEUR HAZY (A) 12/12/2019 1539   APPEARANCEUR Turbid (A) 02/14/2019 0000   LABSPEC 1.016 12/12/2019 1539   LABSPEC 1.005 01/15/2015 1229   PHURINE 6.0 12/12/2019 1539   GLUCOSEU NEGATIVE 12/12/2019 1539   GLUCOSEU Negative 01/15/2015 1229   HGBUR NEGATIVE 12/12/2019 1539   BILIRUBINUR NEGATIVE 12/12/2019 1539   BILIRUBINUR Negative 02/14/2019 0000   BILIRUBINUR Negative 01/15/2015 1229   KETONESUR NEGATIVE 12/12/2019 1539   PROTEINUR 30 (A) 12/12/2019 1539   UROBILINOGEN 0.2 01/23/2017 1348   UROBILINOGEN 0.2 01/15/2015 1229   NITRITE POSITIVE (A) 12/12/2019 1539   LEUKOCYTESUR SMALL (A) 12/12/2019 1539   LEUKOCYTESUR Moderate 01/15/2015 1229   Sepsis Labs Invalid input(s): PROCALCITONIN,  WBC,  LACTICIDVEN Microbiology Recent Results (from the past 240 hour(s))  Culture, blood (routine x 2)     Status: Abnormal   Collection Time: 12/12/19  2:39 PM   Specimen: BLOOD  Result Value Ref Range Status   Specimen Description   Final    BLOOD BLOOD LEFT WRIST Performed at [facility], [address], [place], [place] [address]    Special Requests   Final    BOTTLES DRAWN AEROBIC AND ANAEROBIC Blood Culture adequate volume Performed at [facility], [address] [place], [place] [address]    Culture  Setup Time   Final    GRAM NEGATIVE RODS AEROBIC BOTTLE ONLY CRITICAL RESULT CALLED TO, READ BACK BY AND VERIFIED WITH: CHRISTINE KATSOUDAS AT [place] ON 12/13/2019 [place]. Performed at [facility], [place] [address], [place], [place] [address]    Culture KLEBSIELLA PNEUMONIAE (A)  Final   Report Status 12/15/2019 FINAL  Final   Organism ID, Bacteria KLEBSIELLA PNEUMONIAE  Final      Susceptibility   Klebsiella pneumoniae - MIC*    AMPICILLIN RESISTANT Resistant     CEFAZOLIN <=4 SENSITIVE Sensitive     CEFEPIME <=0.12 SENSITIVE Sensitive     CEFTAZIDIME <=1 SENSITIVE Sensitive      CEFTRIAXONE <=0.25 SENSITIVE Sensitive     CIPROFLOXACIN <=0.25 SENSITIVE Sensitive     GENTAMICIN <=1 SENSITIVE Sensitive     IMIPENEM <=0.25 SENSITIVE Sensitive     TRIMETH/SULFA <=20 SENSITIVE Sensitive     AMPICILLIN/SULBACTAM <=2 SENSITIVE Sensitive     PIP/TAZO <=4 SENSITIVE Sensitive     * KLEBSIELLA PNEUMONIAE  Blood Culture ID Panel (Reflexed)     Status: Abnormal   Collection Time: 12/12/19  2:39 PM  Result Value Ref Range Status   Enterococcus species NOT DETECTED NOT DETECTED Final   Listeria monocytogenes NOT DETECTED NOT DETECTED Final   Staphylococcus species NOT DETECTED NOT DETECTED Final   Staphylococcus aureus (BCID) NOT DETECTED NOT DETECTED Final   Streptococcus species NOT DETECTED NOT DETECTED Final   Streptococcus agalactiae NOT DETECTED NOT DETECTED Final   Streptococcus pneumoniae NOT DETECTED NOT DETECTED Final   Streptococcus pyogenes NOT DETECTED NOT DETECTED Final   Acinetobacter baumannii NOT DETECTED NOT DETECTED Final   Enterobacteriaceae species DETECTED (A) NOT DETECTED Final    Comment: Enterobacteriaceae represent a large family of gram-negative bacteria, not a single organism. CRITICAL RESULT CALLED TO, READ BACK BY AND VERIFIED WITH: CHRISTINE KATSOUDAS AT [place] ON 12/13/2019 [place].    Enterobacter cloacae complex NOT DETECTED NOT DETECTED  Final   Escherichia coli NOT DETECTED NOT DETECTED Final   Klebsiella oxytoca NOT DETECTED NOT DETECTED Final   Klebsiella pneumoniae DETECTED (A) NOT DETECTED Final    Comment: CRITICAL RESULT CALLED TO, READ BACK BY AND VERIFIED WITH: CHRISTINE KATSOUDAS AT 0945 ON 12/13/2019 [place].    Proteus species NOT DETECTED NOT DETECTED Final   Serratia marcescens NOT DETECTED NOT DETECTED Final   Carbapenem resistance NOT DETECTED NOT DETECTED Final   Haemophilus influenzae NOT DETECTED NOT DETECTED Final   Neisseria meningitidis NOT DETECTED NOT DETECTED Final   Pseudomonas aeruginosa NOT DETECTED NOT DETECTED Final    Candida albicans NOT DETECTED NOT DETECTED Final   Candida glabrata NOT DETECTED NOT DETECTED Final   Candida krusei NOT DETECTED NOT DETECTED Final   Candida parapsilosis NOT DETECTED NOT DETECTED Final   Candida tropicalis NOT DETECTED NOT DETECTED Final    Comment: Performed at [facility], [address] [place], [place] [address]  Culture, blood (routine x 2)     Status: None (Preliminary result)   Collection Time: 12/12/19  2:44 PM   Specimen: BLOOD  Result Value Ref Range Status   Specimen Description BLOOD LEFT ANTECUBITAL  Final   Special Requests   Final    BOTTLES DRAWN AEROBIC AND ANAEROBIC Blood Culture results may not be optimal due to an inadequate volume of blood received in culture bottles   Culture   Final    NO GROWTH 4 DAYS Performed at [facility], [address], [place], [place] [address]    Report Status PENDING  Incomplete  Urine Culture     Status: Abnormal   Collection Time: 12/12/19  3:39 PM   Specimen: Urine, Random  Result Value Ref Range Status   Specimen Description   Final    URINE, RANDOM Performed at [facility], [address] [place], [place] [address]    Special Requests   Final    NONE Performed at [facility], [address] [place], [place] [address]    Culture >=100,000 COLONIES/mL KLEBSIELLA PNEUMONIAE (A)  Final   Report Status 12/15/2019 FINAL  Final   Organism ID, Bacteria KLEBSIELLA PNEUMONIAE (A)  Final      Susceptibility   Klebsiella pneumoniae - MIC*    AMPICILLIN RESISTANT Resistant     CEFAZOLIN <=4 SENSITIVE Sensitive     CEFTRIAXONE <=0.25 SENSITIVE Sensitive     CIPROFLOXACIN <=0.25 SENSITIVE Sensitive     GENTAMICIN <=1 SENSITIVE Sensitive     IMIPENEM <=0.25 SENSITIVE Sensitive     NITROFURANTOIN <=16 SENSITIVE Sensitive     TRIMETH/SULFA <=20 SENSITIVE Sensitive     AMPICILLIN/SULBACTAM 4 SENSITIVE Sensitive     PIP/TAZO <=4 SENSITIVE Sensitive     * >=100,000  COLONIES/mL KLEBSIELLA PNEUMONIAE    Time coordinating discharge: Over 30 minutes  SIGNED:  Lorella Nimrod, MD  [facility] 12/16/2019, 1:01 PM  If 7PM-7AM, please contact night-coverage [email or web address]  This record has been created using Systems analyst. Errors have been sought and corrected,but may not always be located. Such creation errors do not reflect on the standard of care.

## 2019-12-16 NOTE — NC FL2 (Signed)
[place] LEVEL OF CARE SCREENING TOOL     IDENTIFICATION  Patient Name: Brandi Hanson Birthdate: April 05, 1936 Sex: female Admission Date (Current Location): 12/12/2019  [place] and [place] Number:  Engineering geologist and Address:  [facility], [address], [place], [place] [address]      Provider Number: [id number]  Attending Physician Name and Address:  Lorella Nimrod, MD  Relative Name and Phone Number:  Elta Guadeloupe [phone]    Current Level of Care: Hospital Recommended Level of Care: Memory Care Prior Approval Number:    Date Approved/Denied:   PASRR Number:    Discharge Plan:      Current Diagnoses: Patient Active Problem List   Diagnosis Date Noted  . Severe sepsis ([place]) 12/12/2019  . Severe sepsis with acute organ dysfunction ([place]) 12/12/2019  . Dementia without behavioral disturbance ([place]) 12/12/2019  . Toxic metabolic encephalopathy [id number]  . Near syncope 12/12/2019  . Aortic atherosclerosis ([place]) 01/03/2017  . Renal calculi 01/03/2017  . Allergic rhinitis 07/07/2015  . MCI (mild cognitive impairment) with memory loss 03/25/2015  . Depression, major, recurrent, mild ([place]) 03/25/2015  . Generalized anxiety disorder 03/25/2015  . IBS (irritable bowel syndrome) 02/15/2015  . At risk for falling 01/08/2015  . Body mass index (BMI) of 23.0-23.9 in adult 01/08/2015  . H/O malignant neoplasm of breast 01/08/2015  . Chronic constipation 01/08/2015  . Breast cancer of upper-outer quadrant of left female breast ([place]) 01/08/2015  . HZV (herpes zoster virus) post herpetic neuralgia 01/08/2015  . Apnea, sleep 01/08/2015  . Palpitations 08/21/2013  . Benign essential HTN 01/02/2012  . Beat, premature ventricular 04/21/2009  . Absolute anemia 02/05/2009  . Acid reflux 02/05/2009  . Essential (primary) hypertension 02/05/2009  . Bone/cartilage disorder 07/22/2007  . Avitaminosis D 10/08/2006  . Carotid artery  obstruction 09/11/1998    Orientation RESPIRATION BLADDER Height & Weight     (Disoriented x 4)  Normal External catheter Weight: 116 lb 13.5 oz (53 kg) Height:  5\' 6"  (167.6 cm)  BEHAVIORAL SYMPTOMS/MOOD NEUROLOGICAL BOWEL NUTRITION STATUS      Incontinent Diet(Regular diet, thin liquids.)  AMBULATORY STATUS COMMUNICATION OF NEEDS Skin   Supervision Verbally Bruising                       Personal Care Assistance Level of Assistance  Bathing, Feeding, Dressing Bathing Assistance: Limited assistance Feeding assistance: Limited assistance(set up assistance) Dressing Assistance: Limited assistance     Functional Limitations Info             [place]  PT (By licensed PT), OT (By licensed OT)     PT Frequency: Home Health PT 2-3x/week OT Frequency: Home Health OT 2-3x/week            Contractures      Additional Factors Info  Code Status, Allergies Code Status Info: DNR Allergies Info: Sulfa Antibiotics           Current Medications (12/16/2019):  This is the current hospital active medication list Current Facility-Administered Medications  Medication Dose Route Frequency Provider Last Rate Last Admin  . acetaminophen (TYLENOL) tablet 650 mg  650 mg Oral Q6H PRN Dhungel, Nishant, MD       Or  . acetaminophen (TYLENOL) suppository 650 mg  650 mg Rectal Q6H PRN Dhungel, Nishant, MD      . amoxicillin-clavulanate (AUGMENTIN) 875-125 MG per tablet 1 tablet  1 tablet Oral Q12H Amin,  Sumayya, MD      . cholecalciferol (VITAMIN D3) tablet 2,000 Units  2,000 Units Oral Daily Dhungel, Nishant, MD   2,000 Units at 12/16/19 0927  . enoxaparin (LOVENOX) injection 40 mg  40 mg Subcutaneous Q24H Dhungel, Nishant, MD   40 mg at 12/15/19 1806  . feeding supplement (ENSURE ENLIVE) (ENSURE ENLIVE) liquid 237 mL  237 mL Oral BID BM Dhungel, Nishant, MD   237 mL at 12/16/19 0928  . fluticasone (FLONASE) 50 MCG/ACT nasal spray 1 spray  1 spray Each Nare  Daily Dhungel, Nishant, MD   1 spray at 12/16/19 0927  . haloperidol lactate (HALDOL) injection 0.5 mg  0.5 mg Intravenous Q6H PRN Dhungel, Nishant, MD   0.5 mg at 12/15/19 1505  . ondansetron (ZOFRAN) tablet 4 mg  4 mg Oral Q6H PRN Dhungel, Nishant, MD       Or  . ondansetron (ZOFRAN) injection 4 mg  4 mg Intravenous Q6H PRN Dhungel, Nishant, MD      . polyethylene glycol (MIRALAX / GLYCOLAX) packet 17 g  17 g Oral Daily Lorella Nimrod, MD   17 g at 12/16/19 0927  . polyvinyl alcohol (LIQUIFILM TEARS) 1.4 % ophthalmic solution 1 drop  1 drop Both Eyes BID PC Dhungel, Nishant, MD   1 drop at 12/16/19 0927  . sodium chloride flush (NS) 0.9 % injection 3 mL  3 mL Intravenous Q12H Dhungel, Nishant, MD   3 mL at 12/16/19 0928  . vitamin B-12 (CYANOCOBALAMIN) tablet 1,000 mcg  1,000 mcg Oral Daily Lorella Nimrod, MD   1,000 mcg at 12/16/19 [id number]     Discharge Medications: Please see discharge summary for a list of discharge medications.  Relevant Imaging Results:  Relevant Lab Results:   Additional Information SS#: [id number]  Phil Campbell, LCSW

## 2019-12-16 NOTE — TOC Progression Note (Addendum)
Transition of Care [facility]) - Progression Note    Patient Details  Name: Brandi Hanson MRN: [id number] Date of Birth: Nov 13, 1935  Transition of Care [facility]) CM/SW [place], LCSW Phone Number: 12/16/2019, 9:34 AM  Clinical Narrative:   PT and OT are recommending patient return to [facility] and set up [facility] and OT services at discharge. Called [facility] to follow-up if they have a preferred provider. Left voicemail for [name] requesting a return call. Per Receptionist, their preferred Home Health is [facility].   CSW called nephew, Brandi Hanson, regarding this recommendation. Left a voicemail. Reached out to [name] with [facility] who reported they can accept patient. Waiting for confirmation from [facility].  10:50- Received a return call from [name] who confirmed they would like patient to return to [facility] with Home Health services through [facility]. Mark asked about transport back to the ALF when patient is medically cleared for discharge. Brandi Hanson reported he does not feel they would be able to safely transport patient in his car and would like Non-emergent EMS transport.   CSW called [facility] Representative [name] with referral for Home Health PT and OT. [name] confirmed she is able to accept patient.   12:00- Called [address] [employer]. Informed [name] that per rounds, patient has had a telesitter at the hospital. [name] reported patient is not in their Memory Care Unit due to family's wishes, but they will readdress this when patient returns to [facility] as it is an option for them and something [name] staff has been recommending. Informed her of plan to discharge back to [name] today with [name] and OT. [name] reported patient is fine to come back to her current room at [facility] (not in [facility]) as long as she can ambulate and does not need a lot of supervision due to their staffing on patient's current floor at the ALF. CSW sent message  to PT and OT to confirm this. Also asked MD to submit [place] orders.  12:30- Followed up with PT then called [name]. Informed her that per PT, patient does currently need moderate assist to maintain her balance which they feel is due to acute illness and should improve. Patient walked a minimal distance in her room with her walker. [name] reported she feels patient would need SNF rehab before coming back to their ALF and asked for CSW to speak with PT and OT again about this to see if this is recommended again. Informed [name] that this morning when PT and OT saw patient, they recommended back to ALF with Home Health and that they are aware Home Health OT and PT are usually 2-3 times a week. Informed [name] that PT said she did not feel patient would benefit from SNF rehab due to her cognition, but that CSW will touch base with PT and OT again about her concerns. Updated MD, PT and OT.  12:50- Per PT, they still feel that patient would not benefit from SNF rehab due to her cognition so this is not something they can recommend at this time. PT and OT are both recommending Memory Care with Home Health. Spoke with [name] again. She was agreeable to patient returning to [name] and moving to their [name] today IF family will agree to this. She said that they have refused moving her to the Memory Care Unit in the past. CSW will call Brandi Hanson to follow-up  on this. [name] reported if patient's family is agreeable to this, they can still accept patient to discharge back to [facility] Unit today. Just needs an updated FL2.   CSW called and spoke to nephew Brandi Hanson with update. Brandi Hanson reported he will need to talk to his mom and decide whether they want patient to transition to Memory Care. Informed him that [name] cannot take her back unless she goes to [facility] because they feel this is best for her safety. Informed him that PT and OT cannot recommend  SNF rehab so insurance would not cover this. He declined any family being able to care for patient in their home. He asked if patient can stay another night so they have time to decide about moving patient to Memory Care. Informed him that per MD, patient is medically cleared to go so insurance may not cover patient staying another night. He verbalized understanding but still wanted CSW to ask if patient can stay another night. CSW updated MD, PT, and OT.   1:50- CSW called nephew, Brandi Hanson. No answer. Left a voicemail informing Brandi Hanson that per MD and my TOC Leadership, the discharge order is in and patient medically cleared so she must discharge today to Memory Care with [facility] or home with [facility]. Informed Brandi Hanson that if patient were to stay another night that is not medically necessary, I will have to issue a letter to him saying that they may be responsible for the bill. Requested a return call as soon as possible.   2:30- CSW spoke with Brandi Hanson who reported they have decided to send patient to the [place] Unit with [facility] today. CSW informed RN [name]. Will call EMS when patient is ready for nonermergent transport. EMS paperwork and DNR is on chart. CSW called [facility] to inform her of plan to discharge to their Memory Care Unit today. CSW is faxing FL2 to [name]. Per [name], RN to call report to [name] at [phone].   2:45- Call from [facility] asking if patient can stay another night so they have more time to get the room ready. Spoke with [name] Leadership again and informed Ed that this is not possible since patient is medically cleared to go and has discharge orders in. Ed verbalized understanding and reported he will make sure patient's room is ready for her to discharge back today.     Expected Discharge Plan: Assisted Living Barriers to Discharge: Continued Medical Work up  Expected Discharge Plan and  Services Expected Discharge Plan: Assisted Living       Living arrangements for the past 2 months: Assisted Living Facility                                       Social Determinants of Health (SDOH) Interventions    Readmission Risk Interventions No flowsheet data found.

## 2019-12-16 NOTE — Evaluation (Addendum)
Occupational Therapy Evaluation Patient Details Name: Brandi Hanson MRN: [id number] DOB: December 17, 1935 Today's Date: 12/16/2019    History of Present Illness Brandi Hanson is an 84 year old female with history of dementia, hypertension, GERD resident of assisted living, who presented with generalized weakness and acute change in her mental status.  Patient found to be in severe sepsis with fever of 104.5 F, hypotension and tachypnea.  UA suggestive of UTI.  Given IV fluid bolus and empiric antibiotic and admitted for further management.   Clinical Impression   Brandi Hanson was seen for OT evaluation this date. Pt received semi-supine in bed with safety mitts donned. She remains pleasantly confused t/o session. Due to baseline cognitive impairments, pt is unable to provide information regarding home set-up/PLOF. Per chart, pt is a long-term resident of [name]. Currently pt demonstrates impairments as described below (See OT problem list) which functionally limit her ability to perform ADL/self-care tasks. She participates well t/o session given consistent multimodal cueing for initiate/sequencing of tasks.  Pt currently requires moderate assistance for fxl mobility as well as moderate to max assist with LB ADL management.  Pt would benefit from skilled OT to address noted impairments and functional limitations (see below for any additional details) in order to maximize safety and independence while minimizing falls risk and caregiver burden. Upon hospital discharge, recommend pt receive 24/7 assist/supervision. She benefit from transition to a memory care unit to maximize safety.     Follow Up Recommendations  Supervision/Assistance - 24 hour/Memory Care Unit    Equipment Recommendations       Recommendations for Other Services       Precautions / Restrictions Precautions Precautions: Fall Precaution Comments: High Fall      Mobility Bed Mobility Overal bed mobility: Needs  Assistance Bed Mobility: Supine to Sit     Supine to sit: Max assist;HOB elevated     General bed mobility comments: Max assist to initiate movement as well as for trunk elevation this date.  Transfers Overall transfer level: Needs assistance Equipment used: Rolling walker (2 wheeled);2 person hand held assist Transfers: Sit to/from Stand Sit to Stand: +2 physical assistance;Mod assist;Max assist         General transfer comment: Pt requires variable levels of assist during fxl mobility this date. To initate STS she requlires +2 mod/max assist. Fxl mobility improves with use of RW. Pt requires +1 mod assist to ambulate in room this date.    Balance Overall balance assessment: Needs assistance Sitting-balance support: Feet supported;Single extremity supported Sitting balance-Leahy Scale: Fair Sitting balance - Comments: Pt requires min A to CGA to maintain sitting balance this date.   Standing balance support: During functional activity;Bilateral upper extremity supported Standing balance-Leahy Scale: Poor Standing balance comment: Moderate to max assist to maintain standing balance with occasional +2 required for physical assist/safety.                           ADL either performed or assessed with clinical judgement   ADL Overall ADL's : Needs assistance/impaired Eating/Feeding: Set up;Moderate assistance;Supervision/ safety;Cueing for sequencing;Sitting Eating/Feeding Details (indicate cue type and reason): Pt requires minimal to moderate assistance wel self-feeding this date, primarily to initiate tasks/transition between food items on her tray. By end of session, she is noted to be able to use her fork to self-feed eggs w/o physical assist. RN notified pt will continue to require assist with meal tray set-up and initiation of meals.  Grooming: Sitting;Minimal assistance;Moderate assistance;Cueing for sequencing   Upper Body Bathing: Set up;Moderate  assistance;Sitting;Cueing for sequencing   Lower Body Bathing: Sit to/from stand;+2 for physical assistance;Moderate assistance;Cueing for sequencing   Upper Body Dressing : Sitting;Moderate assistance;Cueing for sequencing   Lower Body Dressing: Sit to/from stand;+2 for physical assistance;Moderate assistance;Cueing for sequencing   Toilet Transfer: BSC;Moderate assistance;Stand-pivot;RW   Toileting- Clothing Manipulation and Hygiene: Sit to/from stand;Moderate assistance;+2 for physical assistance;With adaptive equipment       Functional mobility during ADLs: +2 for physical assistance;+2 for safety/equipment;Rolling walker;Moderate assistance       Vision   Additional Comments: Pt unable to state, appears to visually track stimuli appropriately during evaluation. Will continue to assess.     Perception     Praxis      Pertinent Vitals/Pain Pain Assessment: Faces Faces Pain Scale: No hurt Pain Intervention(s): Monitored during session     Hand Dominance Right   Extremity/Trunk Assessment Upper Extremity Assessment Upper Extremity Assessment: Generalized weakness   Lower Extremity Assessment Lower Extremity Assessment: Generalized weakness       Communication Communication Communication: No difficulties   Cognition Arousal/Alertness: Awake/alert Behavior During Therapy: Flat affect;WFL for tasks assessed/performed Overall Cognitive Status: History of cognitive impairments - at baseline                                 General Comments: Pt with limited verbal interaction during session, she generally responds using 1-2 word phrases. She speaks softly, and remains pleasantly confused t/o evaluation. Unable to answer A&O questions, but does turn toward sound of her name and informs this Pryor Curia she prefers to be called "Brandi Hanson".   General Comments       Exercises Other Exercises Other Exercises: OT engages pt in self-feeding this date. See ADL section  for detail. Nsg staff educated on level of assist needed. Other Exercises: OT provides +2 assist for fxl mobility/transfers with PT this date.   Shoulder Instructions      Home Living Family/patient expects to be discharged to:: Assisted living                                 Additional Comments: Pt unable to provide information regarding home equipment/PLOF due to impaired cognition. Does shake head yes when asked if she's ever used a walker. Per chart,  she is a long-term resident at [facility]      Prior Functioning/Environment Level of Independence: Needs assistance                 OT Problem List: Decreased strength;Decreased coordination;Decreased range of motion;Decreased cognition;Decreased activity tolerance;Decreased safety awareness;Impaired balance (sitting and/or standing);Decreased knowledge of use of DME or AE      OT Treatment/Interventions: Self-care/ADL training;Therapeutic exercise;Therapeutic activities;DME and/or AE instruction;Patient/family education;Balance training;Energy conservation    OT Goals(Current goals can be found in the care plan section) Acute Rehab OT Goals Patient Stated Goal: Pt unable to state Time For Goal Achievement: 12/30/19 ADL Goals Pt Will Perform Eating: sitting;with min assist;with caregiver independent in assisting(With LRAD PRN for improved safety/functional independence.) Pt Will Perform Grooming: sitting;with min assist;with caregiver independent in assisting(With LRAD PRN for improved safety/functional independence.) Pt Will Transfer to Toilet: ambulating;bedside commode;with min guard assist(With LRAD PRN for improved safety/functional independence.) Pt Will Perform Toileting - Clothing Manipulation and hygiene: sit to/from stand;with min assist;with caregiver  independent in assisting;with adaptive equipment(With LRAD PRN for improved safety/functional independence.)  OT Frequency: Min 2X/week   Barriers to  D/C:            Co-evaluation PT/OT/SLP Co-Evaluation/Treatment: Yes Reason for Co-Treatment: Necessary to address cognition/behavior during functional activity;For patient/therapist safety;To address functional/ADL transfers PT goals addressed during session: Mobility/safety with mobility;Balance;Proper use of DME;Strengthening/ROM OT goals addressed during session: ADL's and self-care;Proper use of Adaptive equipment and DME      AM-PAC OT "6 Clicks" Daily Activity     Outcome Measure Help from another person eating meals?: A Little Help from another person taking care of personal grooming?: A Little Help from another person toileting, which includes using toliet, bedpan, or urinal?: A Lot Help from another person bathing (including washing, rinsing, drying)?: A Lot Help from another person to put on and taking off regular upper body clothing?: A Little Help from another person to put on and taking off regular lower body clothing?: A Lot 6 Click Score: 15   End of Session Equipment Utilized During Treatment: Gait belt;Rolling walker Nurse Communication: Other (comment);Mobility status(Pt left with safety mitts doffed to finish breakfast. RN in room for AM assessment)  Activity Tolerance: Patient tolerated treatment well Patient left: in chair;with call bell/phone within reach;with chair alarm set;with nursing/sitter in room  OT Visit Diagnosis: Other abnormalities of gait and mobility (R26.89);Muscle weakness (generalized) (M62.81);Other symptoms and signs involving cognitive function                Time: [id number] OT Time Calculation (min): 53 min Charges:  OT General Charges $OT Visit: 1 Visit OT Evaluation $OT Eval Moderate Complexity: 1 Mod OT Treatments $Self Care/Home Management : 23-37 mins  Shara Blazing, M.S., OTR/L Ascom: [phone] 12/16/19, 11:01 AM

## 2019-12-17 LAB — CULTURE, BLOOD (ROUTINE X 2): Culture: NO GROWTH

## 2020-01-07 ENCOUNTER — Emergency Department: Payer: Medicare PPO

## 2020-01-07 ENCOUNTER — Inpatient Hospital Stay
Admission: EM | Admit: 2020-01-07 | Discharge: 2020-01-13 | DRG: 853 | Disposition: A | Discharge status: Home Health | Payer: Medicare PPO | Source: Skilled Nursing Facility | Attending: Family Medicine | Admitting: Family Medicine

## 2020-01-07 ENCOUNTER — Other Ambulatory Visit: Payer: Self-pay

## 2020-01-07 DIAGNOSIS — N136 Pyonephrosis: Secondary | ICD-10-CM | POA: Diagnosis present

## 2020-01-07 DIAGNOSIS — A419 Sepsis, unspecified organism: Secondary | ICD-10-CM

## 2020-01-07 DIAGNOSIS — Z20822 Contact with and (suspected) exposure to covid-19: Secondary | ICD-10-CM | POA: Diagnosis present

## 2020-01-07 DIAGNOSIS — D6959 Other secondary thrombocytopenia: Secondary | ICD-10-CM | POA: Diagnosis present

## 2020-01-07 DIAGNOSIS — Z90710 Acquired absence of both cervix and uterus: Secondary | ICD-10-CM

## 2020-01-07 DIAGNOSIS — A4152 Sepsis due to Pseudomonas: Principal | ICD-10-CM | POA: Diagnosis present

## 2020-01-07 DIAGNOSIS — E860 Dehydration: Secondary | ICD-10-CM | POA: Diagnosis present

## 2020-01-07 DIAGNOSIS — Z853 Personal history of malignant neoplasm of breast: Secondary | ICD-10-CM

## 2020-01-07 DIAGNOSIS — G92 Toxic encephalopathy: Secondary | ICD-10-CM | POA: Diagnosis present

## 2020-01-07 DIAGNOSIS — Z66 Do not resuscitate: Secondary | ICD-10-CM | POA: Diagnosis present

## 2020-01-07 DIAGNOSIS — R7881 Bacteremia: Secondary | ICD-10-CM | POA: Diagnosis not present

## 2020-01-07 DIAGNOSIS — I10 Essential (primary) hypertension: Secondary | ICD-10-CM | POA: Diagnosis present

## 2020-01-07 DIAGNOSIS — G928 Other toxic encephalopathy: Secondary | ICD-10-CM | POA: Diagnosis present

## 2020-01-07 DIAGNOSIS — N390 Urinary tract infection, site not specified: Secondary | ICD-10-CM

## 2020-01-07 DIAGNOSIS — N138 Other obstructive and reflux uropathy: Secondary | ICD-10-CM | POA: Diagnosis not present

## 2020-01-07 DIAGNOSIS — M810 Age-related osteoporosis without current pathological fracture: Secondary | ICD-10-CM | POA: Diagnosis present

## 2020-01-07 DIAGNOSIS — K219 Gastro-esophageal reflux disease without esophagitis: Secondary | ICD-10-CM | POA: Diagnosis present

## 2020-01-07 DIAGNOSIS — Z79899 Other long term (current) drug therapy: Secondary | ICD-10-CM

## 2020-01-07 DIAGNOSIS — N1330 Unspecified hydronephrosis: Secondary | ICD-10-CM | POA: Diagnosis not present

## 2020-01-07 DIAGNOSIS — F0390 Unspecified dementia without behavioral disturbance: Secondary | ICD-10-CM | POA: Diagnosis present

## 2020-01-07 DIAGNOSIS — F329 Major depressive disorder, single episode, unspecified: Secondary | ICD-10-CM | POA: Diagnosis present

## 2020-01-07 DIAGNOSIS — N200 Calculus of kidney: Secondary | ICD-10-CM | POA: Diagnosis not present

## 2020-01-07 DIAGNOSIS — E785 Hyperlipidemia, unspecified: Secondary | ICD-10-CM | POA: Diagnosis present

## 2020-01-07 DIAGNOSIS — F419 Anxiety disorder, unspecified: Secondary | ICD-10-CM | POA: Diagnosis present

## 2020-01-07 DIAGNOSIS — N132 Hydronephrosis with renal and ureteral calculous obstruction: Secondary | ICD-10-CM | POA: Diagnosis not present

## 2020-01-07 LAB — CBC WITH DIFFERENTIAL/PLATELET
Abs Immature Granulocytes: 0.16 10*3/uL — ABNORMAL HIGH (ref 0.00–0.07)
Basophils Absolute: 0 10*3/uL (ref 0.0–0.1)
Basophils Relative: 0 %
Eosinophils Absolute: 0 10*3/uL (ref 0.0–0.5)
Eosinophils Relative: 0 %
HCT: 31.3 % — ABNORMAL LOW (ref 36.0–46.0)
Hemoglobin: 10.2 g/dL — ABNORMAL LOW (ref 12.0–15.0)
Immature Granulocytes: 1 %
Lymphocytes Relative: 2 %
Lymphs Abs: 0.5 10*3/uL — ABNORMAL LOW (ref 0.7–4.0)
MCH: 29 pg (ref 26.0–34.0)
MCHC: 32.6 g/dL (ref 30.0–36.0)
MCV: 88.9 fL (ref 80.0–100.0)
Monocytes Absolute: 1.5 10*3/uL — ABNORMAL HIGH (ref 0.1–1.0)
Monocytes Relative: 8 %
Neutro Abs: 16.3 10*3/uL — ABNORMAL HIGH (ref 1.7–7.7)
Neutrophils Relative %: 89 %
Platelets: 141 10*3/uL — ABNORMAL LOW (ref 150–400)
RBC: 3.52 MIL/uL — ABNORMAL LOW (ref 3.87–5.11)
RDW: 14.1 % (ref 11.5–15.5)
WBC: 18.5 10*3/uL — ABNORMAL HIGH (ref 4.0–10.5)
nRBC: 0 % (ref 0.0–0.2)

## 2020-01-07 LAB — URINALYSIS, ROUTINE W REFLEX MICROSCOPIC
Bacteria, UA: NONE SEEN
Bilirubin Urine: NEGATIVE
Glucose, UA: 50 mg/dL — AB
Ketones, ur: NEGATIVE mg/dL
Nitrite: NEGATIVE
Protein, ur: 100 mg/dL — AB
Specific Gravity, Urine: 1.015 (ref 1.005–1.030)
Squamous Epithelial / HPF: NONE SEEN (ref 0–5)
WBC, UA: >50 WBC/hpf — ABNORMAL HIGH (ref 0–5)
pH: 6 (ref 5.0–8.0)

## 2020-01-07 LAB — PROTIME-INR
INR: 0.9 (ref 0.8–1.2)
Prothrombin Time: 12.2 seconds (ref 11.4–15.2)

## 2020-01-07 LAB — RESPIRATORY PANEL BY RT PCR (FLU A&B, COVID)
Influenza A by PCR: NEGATIVE
Influenza B by PCR: NEGATIVE
SARS Coronavirus 2 by RT PCR: NEGATIVE

## 2020-01-07 LAB — COMPREHENSIVE METABOLIC PANEL
ALT: 15 U/L (ref 0–44)
AST: 19 U/L (ref 15–41)
Albumin: 3.2 g/dL — ABNORMAL LOW (ref 3.5–5.0)
Alkaline Phosphatase: 66 U/L (ref 38–126)
Anion gap: 10 (ref 5–15)
BUN: 29 mg/dL — ABNORMAL HIGH (ref 8–23)
CO2: 25 mmol/L (ref 22–32)
Calcium: 8.9 mg/dL (ref 8.9–10.3)
Chloride: 102 mmol/L (ref 98–111)
Creatinine, Ser: 0.66 mg/dL (ref 0.44–1.00)
GFR calc Af Amer: >60 mL/min (ref >60)
GFR calc non Af Amer: >60 mL/min (ref >60)
Glucose, Bld: 167 mg/dL — ABNORMAL HIGH (ref 70–99)
Potassium: 3.7 mmol/L (ref 3.5–5.1)
Sodium: 137 mmol/L (ref 135–145)
Total Bilirubin: 0.5 mg/dL (ref 0.3–1.2)
Total Protein: 6.1 g/dL — ABNORMAL LOW (ref 6.5–8.1)

## 2020-01-07 LAB — LACTIC ACID, PLASMA: Lactic Acid, Venous: 1.7 mmol/L (ref 0.5–1.9)

## 2020-01-07 MED ORDER — ONDANSETRON HCL 4 MG PO TABS: 4.0000 mg | ORAL_TABLET | Freq: Four times a day (QID) | ORAL | Status: DC | PRN

## 2020-01-07 MED ORDER — SODIUM CHLORIDE 0.9 % IV SOLN
2.0000 g | Freq: Two times a day (BID) | INTRAVENOUS | Status: DC
  Administered 2020-01-08: 2 g via INTRAVENOUS
  Filled 2020-01-07 (×2): qty 2

## 2020-01-07 MED ORDER — SODIUM CHLORIDE 0.9 % IV SOLN
2.0000 g | Freq: Once | INTRAVENOUS | Status: AC
  Administered 2020-01-07: 2 g via INTRAVENOUS
  Filled 2020-01-07: qty 2

## 2020-01-07 MED ORDER — SENNOSIDES-DOCUSATE SODIUM 8.6-50 MG PO TABS
1.0000 | ORAL_TABLET | Freq: Every evening | ORAL | Status: DC | PRN
  Administered 2020-01-08: 1 via ORAL
  Filled 2020-01-07: qty 1

## 2020-01-07 MED ORDER — ONDANSETRON HCL 4 MG/2ML IJ SOLN: 4.0000 mg | Freq: Four times a day (QID) | INTRAMUSCULAR | Status: DC | PRN

## 2020-01-07 MED ORDER — ACETAMINOPHEN 325 MG PO TABS: 650.0000 mg | ORAL_TABLET | Freq: Four times a day (QID) | ORAL | Status: DC | PRN

## 2020-01-07 MED ORDER — METRONIDAZOLE IN NACL 5-0.79 MG/ML-% IV SOLN
500.0000 mg | Freq: Once | INTRAVENOUS | Status: AC
  Administered 2020-01-07: 500 mg via INTRAVENOUS
  Filled 2020-01-07: qty 100

## 2020-01-07 MED ORDER — ACETAMINOPHEN 650 MG RE SUPP
650.0000 mg | Freq: Once | RECTAL | Status: AC
  Administered 2020-01-07: 650 mg via RECTAL
  Filled 2020-01-07: qty 1

## 2020-01-07 MED ORDER — LACTATED RINGERS IV BOLUS (SEPSIS)
1000.0000 mL | Freq: Once | INTRAVENOUS | Status: AC
  Administered 2020-01-07: 1000 mL via INTRAVENOUS

## 2020-01-07 MED ORDER — LACTATED RINGERS IV BOLUS (SEPSIS)
250.0000 mL | Freq: Once | INTRAVENOUS | Status: AC
  Administered 2020-01-07: 250 mL via INTRAVENOUS

## 2020-01-07 MED ORDER — ENOXAPARIN SODIUM 40 MG/0.4ML ~~LOC~~ SOLN
40.0000 mg | SUBCUTANEOUS | Status: DC
  Administered 2020-01-07 – 2020-01-09 (×3): 40 mg via SUBCUTANEOUS
  Filled 2020-01-07 (×3): qty 0.4

## 2020-01-07 MED ORDER — LACTATED RINGERS IV SOLN: INTRAVENOUS | Status: DC

## 2020-01-07 MED ORDER — ACETAMINOPHEN 650 MG RE SUPP: 650.0000 mg | Freq: Four times a day (QID) | RECTAL | Status: DC | PRN

## 2020-01-07 MED ORDER — VANCOMYCIN HCL IN DEXTROSE 1-5 GM/200ML-% IV SOLN
1000.0000 mg | Freq: Once | INTRAVENOUS | Status: AC
  Administered 2020-01-07: 1000 mg via INTRAVENOUS
  Filled 2020-01-07: qty 200

## 2020-01-07 MED ORDER — LACTATED RINGERS IV BOLUS (SEPSIS)
500.0000 mL | Freq: Once | INTRAVENOUS | Status: AC
  Administered 2020-01-07: 500 mL via INTRAVENOUS

## 2020-01-07 NOTE — H&P (Signed)
History and Physical    Brandi Hanson [id number] DOB: 01/10/1936 DOA: 01/07/2020  PCP: System, Pcp Not In   Patient coming from: Assisted living facility I have personally briefly reviewed patient's old medical records in [place]  Chief Complaint: Altered mental status  HPI: Brandi Hanson is a 83 y.o. female with medical history significant for Advanced dementia, hypertension, GERD, hospitalized from 4/2 - 12/16/19 with severe sepsis secondary to Klebsiella UTI, presenting with altered mental status, treated with ceftriaxone then Augmentin who presents to the emergency room with altered mental status.  Patient unable to give a history due to advanced dementia.  History taken with the assistance of her nephew at bedside  ED Course: In the emergency room rectal temperature was 102.4 and patient was tachycardic at 101.  BP was normal.  WBC was 18,500 and lactic acid 1.7.  Hemoglobin 10.2.  Urinalysis strongly consistent with UTI.  Patient was started on cefepime and sepsis fluid bolus.  Hospitalist consulted for admission.  Review of Systems: Unable to obtain due to advanced dementia Past Medical History:  Diagnosis Date  . Abdominal distention   . Abdominal pain   . Anxiety   . Arthritis   . Benign essential HTN 01/02/2012  . Breast cancer in situ 07/22/2011  . Cancer ([place])    skin  . Confusion   . Depression   . Generalized headaches   . GERD (gastroesophageal reflux disease)   . Hyperlipidemia   . Hyperlipidemia 01/02/2012  . Hypertension   . Memory loss   . Osteoporosis   . Palpitations   . PVC (premature ventricular contraction)   . Rectal pain   . Sleep apnea     Past Surgical History:  Procedure Laterality Date  . ABDOMINAL HYSTERECTOMY  1978  . [place]   left  . breast cancer  10/2010   ductal carcinoma in situ, left breast  . CATARACT EXTRACTION  2011   bilateral  . FOOT SURGERY  2005  . KIDNEY STONE SURGERY  2011  . ROTATOR CUFF  REPAIR  2007     reports that she has never smoked. She has never used smokeless tobacco. She reports that she does not drink alcohol or use drugs.  Allergies  Allergen Reactions  . Sulfa Antibiotics Nausea Only    Family History  Problem Relation Age of Onset  . Cancer Mother        breast  . Crohn's disease Mother   . Cancer Father        lung  . Hypertension Father   . Cancer Sister        breast and uterine  . Hypertension Sister   . Heart disease Maternal Grandmother   . Cancer Maternal Grandfather        colon  . Hypertension Maternal Grandfather   . Heart disease Paternal Grandmother   . Heart disease Paternal Grandfather      Prior to Admission medications   Medication Sig Start Date End Date Taking? Authorizing Provider  acetaminophen (TYLENOL) 325 MG tablet Take 650 mg by mouth in the morning and at bedtime.    Yes [provider]  acetaminophen (TYLENOL) 500 MG tablet Take 500 mg by mouth every 4 (four) hours as needed for mild pain or fever.   Yes [provider]  Calcium Carb-Cholecalciferol (CALCIUM 600+D) 600-800 MG-UNIT TABS Take 1 tablet by mouth 2 (two) times daily.    Yes [provider]  cholecalciferol (VITAMIN D) 25 MCG tablet Take 2 tablets (2,000 Units total) by mouth daily. 12/17/19  Yes Lorella Nimrod, MD  donepezil (ARICEPT) 10 MG tablet TAKE 1 TABLET BY MOUTH AT BEDTIME Patient taking differently: Take 10 mg by mouth at bedtime.  05/16/17  Yes Garvin Fila, MD  fluticasone (FLONASE) 50 MCG/ACT nasal spray Place 1 spray into both nostrils daily.    Yes [provider]  loratadine (CLARITIN) 10 MG tablet Take 10 mg by mouth daily.   Yes [provider]  LORazepam (ATIVAN) 0.5 MG tablet Take 0.5 tablets (0.25 mg total) by mouth 2 (two) times daily as needed for anxiety or sleep. 12/16/19  Yes Lorella Nimrod, MD  melatonin 3 MG TABS tablet Take 3 mg by mouth at bedtime.   Yes [provider]  memantine  (NAMENDA) 10 MG tablet Take 10 mg by mouth 2 (two) times daily.   Yes [provider]  ondansetron (ZOFRAN) 8 MG tablet Take 8 mg by mouth every 8 (eight) hours as needed for nausea or vomiting.   Yes [provider]  polyethylene glycol (MIRALAX / GLYCOLAX) 17 g packet Take 17 g by mouth daily. 12/17/19  Yes Lorella Nimrod, MD  propranolol (INDERAL) 20 MG tablet Take 1 tablet (20 mg total) by mouth 2 (two) times daily. 01/07/18  Yes Jerrol Banana., MD  Propylene Glycol (SYSTANE COMPLETE OP) Place 1 drop into both eyes in the morning and at bedtime.    Yes [provider]  risperiDONE (RISPERDAL) 0.25 MG tablet Take 0.25 mg by mouth at bedtime.   Yes [provider]  sertraline (ZOLOFT) 100 MG tablet Take 150 mg by mouth daily.   Yes [provider]  vitamin B-12 1000 MCG tablet Take 1 tablet (1,000 mcg total) by mouth daily. 12/17/19  Yes Lorella Nimrod, MD    Physical Exam: Vitals:   01/07/20 1843 01/07/20 1934 01/07/20 1941 01/07/20 1948  BP:  132/71    Pulse:  97 96 95  Resp:  (!) 23 19 17   Temp: (!) 102.4 F (39.1 C)     TempSrc: Rectal     SpO2:  94% 96% 98%  Weight:      Height:         Vitals:   01/07/20 1843 01/07/20 1934 01/07/20 1941 01/07/20 1948  BP:  132/71    Pulse:  97 96 95  Resp:  (!) 23 19 17   Temp: (!) 102.4 F (39.1 C)     TempSrc: Rectal     SpO2:  94% 96% 98%  Weight:      Height:        Constitutional: Somnolent but arousable.  Oriented x1 Eyes: PERLA, EOMI, irises appear normal, anicteric sclera,  ENMT: external ears and nose appear normal, normal hearing             Lips appears normal, oropharynx mucosa, tongue, posterior pharynx appear normal  Neck: neck appears normal, no masses, normal ROM, no thyromegaly, no JVD  CVS: S1-S2 clear, no murmur rubs or gallops,  , no carotid bruits, pedal pulses palpable, No LE edema Respiratory:  clear to auscultation bilaterally, no wheezing, rales or rhonchi.  Respiratory effort normal. No accessory muscle use.  Abdomen: soft nontender, nondistended, normal bowel sounds, no hepatosplenomegaly, no hernias Musculoskeletal: : no cyanosis, clubbing , no contractures or atrophy Neuro: Grossly intact.  Moving all 4 extremities equally Skin: no rashes or lesions or ulcers, no induration or nodule  Labs on Admission: I have personally reviewed following labs and imaging studies  CBC: Recent Labs  Lab 01/07/20 1841  WBC 18.5*  NEUTROABS 16.3*  HGB 10.2*  HCT 31.3*  MCV 88.9  PLT [id number]*   Basic Metabolic Panel: Recent Labs  Lab 01/07/20 1841  NA 137  K 3.7  CL 102  CO2 25  GLUCOSE 167*  BUN 29*  CREATININE 0.66  CALCIUM 8.9   GFR: Estimated Creatinine Clearance: 45.4 mL/min (by C-G formula based on SCr of 0.66 mg/dL). Liver Function Tests: Recent Labs  Lab 01/07/20 1841  AST 19  ALT 15  ALKPHOS 66  BILITOT 0.5  PROT 6.1*  ALBUMIN 3.2*   No results for input(s): LIPASE, AMYLASE in the last 168 hours. No results for input(s): AMMONIA in the last 168 hours. Coagulation Profile: No results for input(s): INR, PROTIME in the last 168 hours. Cardiac Enzymes: No results for input(s): CKTOTAL, CKMB, CKMBINDEX, TROPONINI in the last 168 hours. BNP (last 3 results) No results for input(s): PROBNP in the last 8760 hours. HbA1C: No results for input(s): HGBA1C in the last 72 hours. CBG: No results for input(s): GLUCAP in the last 168 hours. Lipid Profile: No results for input(s): CHOL, HDL, LDLCALC, TRIG, CHOLHDL, LDLDIRECT in the last 72 hours. Thyroid Function Tests: No results for input(s): TSH, T4TOTAL, FREET4, T3FREE, THYROIDAB in the last 72 hours. Anemia Panel: No results for input(s): VITAMINB12, FOLATE, FERRITIN, TIBC, IRON, RETICCTPCT in the last 72 hours. Urine analysis:    Component Value Date/Time   COLORURINE YELLOW (A) 01/07/2020 1848   APPEARANCEUR CLOUDY (A) 01/07/2020 1848   APPEARANCEUR Turbid (A) 02/14/2019  0000   LABSPEC 1.015 01/07/2020 1848   LABSPEC 1.005 01/15/2015 1229   PHURINE 6.0 01/07/2020 1848   GLUCOSEU 50 (A) 01/07/2020 1848   GLUCOSEU Negative 01/15/2015 1229   HGBUR SMALL (A) 01/07/2020 1848   BILIRUBINUR NEGATIVE 01/07/2020 1848   BILIRUBINUR Negative 02/14/2019 0000   BILIRUBINUR Negative 01/15/2015 1229   KETONESUR NEGATIVE 01/07/2020 1848   PROTEINUR 100 (A) 01/07/2020 1848   UROBILINOGEN 0.2 01/23/2017 1348   UROBILINOGEN 0.2 01/15/2015 1229   NITRITE NEGATIVE 01/07/2020 1848   LEUKOCYTESUR LARGE (A) 01/07/2020 1848   LEUKOCYTESUR Moderate 01/15/2015 1229    Radiological Exams on Admission: CT Head Wo Contrast  Result Date: 01/07/2020 CLINICAL DATA:  Encephalopathy. Additional provided: Altered mental status, history of dementia. EXAM: CT HEAD WITHOUT CONTRAST TECHNIQUE: Contiguous axial images were obtained from the base of the skull through the vertex without intravenous contrast. COMPARISON:  Noncontrast head CT 12/11/2016 FINDINGS: Brain: Mild ill-defined hypoattenuation within the cerebral white matter is nonspecific, but consistent with chronic small vessel ischemic disease. Mild generalized parenchymal atrophy. A small chronic lacunar infarct is questioned within the left cerebellar hemisphere (series 4, image 54). Findings are stable as compared to prior head CT 12/12/2019. There is no acute intracranial hemorrhage. No demarcated cortical infarct. No extra-axial fluid collection. No evidence of intracranial mass. No midline shift. Vascular: No hyperdense vessel.  Atherosclerotic calcifications. Skull: Normal. Negative for fracture or focal lesion. Sinuses/Orbits: Visualized orbits show no acute finding. No significant paranasal sinus disease or mastoid effusion at the imaged levels. IMPRESSION: 1. No evidence of acute intracranial abnormality. 2. Stable, mild generalized parenchymal atrophy and background chronic small vessel ischemic disease. A small chronic lacunar  infarct is questioned within the left cerebellum and, in retrospect, this finding was present on prior head CT 12/12/2019. Electronically Signed   By: Kellie Simmering  DO   On: 01/07/2020 19:20   DG Chest Portable 1 View  Result Date: 01/07/2020 CLINICAL DATA:  Shortness of breath EXAM: PORTABLE CHEST 1 VIEW COMPARISON:  12/12/2019 FINDINGS: Patient is rotated. Chronic interstitial prominence. No new consolidation or edema. Pleural effusion or pneumothorax. Stable cardiomediastinal contours. IMPRESSION: No acute process in the chest. Electronically Signed   By: Macy Mis M.D.   On: 01/07/2020 19:26    EKG: Independently reviewed.   Assessment/Plan Principal Problem:   Sepsis secondary to UTI ([place])   Toxic metabolic encephalopathy -Patient presented with altered mental status with work-up consistent with sepsis secondary to UTI. -Patient was hospitalized 3 weeks prior for severe sepsis secondary to Klebsiella UTI -Continue IV cefepime -IV LR at 100 mils per hour following sepsis fluid bolus -Follow cultures -Neurologic checks, fall and aspiration precautions    Essential (primary) hypertension -Continue home propranolol once able to swallow safely    Dementia without behavioral disturbance (HCC) -Continue home Aricept, Ativan, Namenda, Risperdal and Zoloft pending med rec    DVT prophylaxis: Lovenox  Code Status: DNR Family Communication:  none  Disposition Plan: Back to previous home environment Consults called: none  Status: Inpatient    Athena Masse MD [facility]     01/07/2020, 8:14 PM

## 2020-01-07 NOTE — Progress Notes (Signed)
PHARMACY -  BRIEF ANTIBIOTIC NOTE   Pharmacy has received consult(s) for vancomycin and cefepime from an ED provider.  The patient's profile has been reviewed for ht/wt/allergies/indication/available labs.    One time order(s) placed for vanc 1 g + cefepime 2 g  Further antibiotics/pharmacy consults should be ordered by admitting physician if indicated.                       Thank you,  Tawnya Crook, PharmD 01/07/2020  6:50 PM

## 2020-01-07 NOTE — ED Notes (Signed)
Hand off of care and report given to [place], [place]

## 2020-01-07 NOTE — Progress Notes (Signed)
Pharmacy Antibiotic Note  Brandi Hanson is a 84 y.o. female admitted on 01/07/2020. Pharmacy has been consulted for cefepime dosing for UTI.  Plan: Cefepime 2 g IV q12h  Height: 5\' 6"  (167.6 cm) Weight: 52.2 kg (115 lb) IBW/kg (Calculated) : 59.3  Temp (24hrs), Avg:100 F (37.8 C), Min:97.5 F (36.4 C), Max:102.4 F (39.1 C)  Recent Labs  Lab 01/07/20 1841 01/07/20 2041  WBC 18.5*  --   CREATININE 0.66  --   LATICACIDVEN  --  1.7    Estimated Creatinine Clearance: 45.4 mL/min (by C-G formula based on SCr of 0.66 mg/dL).    Allergies  Allergen Reactions  . Sulfa Antibiotics Nausea Only    Antimicrobials this admission: Cefepime 4/28 >>  Dose adjustments this admission: NA  Microbiology results: 4/28 BCx: pending 4/28 UCx: pending    Thank you for allowing pharmacy to be a part of this patient's care.  Tawnya Crook, PharmD 01/07/2020 8:14 PM

## 2020-01-07 NOTE — Progress Notes (Signed)
CODE SEPSIS - PHARMACY COMMUNICATION  **Broad Spectrum Antibiotics should be administered within 1 hour of Sepsis diagnosis**  Time Code Sepsis Called/Page Received: 1851  Antibiotics Ordered: vancomycin/cefepime/metronidazole  Time of 1st antibiotic administration: 1939  Additional action taken by pharmacy: NA  If necessary, Name of Provider/Nurse Contacted: NA    Tawnya Crook ,PharmD Clinical Pharmacist  01/07/2020  7:48 PM

## 2020-01-07 NOTE — ED Provider Notes (Signed)
[facility] Emergency Department Provider Note  ____________________________________________   First MD Initiated Contact with Patient 01/07/20 1826     (approximate)  I have reviewed the triage vital signs and the nursing notes.   HISTORY  Chief Complaint Altered Mental Status    HPI Brandi Hanson is a 84 y.o. female    with past medical history of hypertension, hyperlipidemia, dementia, here with altered mental status.  History limited due to dementia and confusion.  Per report from her facility, the patient has been increasingly confused and less responsive throughout the day today.  She normally is able to answer some questions but has been more out of it.  On my assessment, patient is unable to provide additional history.  She was tachycardic with EMS and received 500 cc of fluid.     Level 5 caveat invoked as remainder of history, ROS, and physical exam limited due to patient's dementia.     Past Medical History:  Diagnosis Date  . Abdominal distention   . Abdominal pain   . Anxiety   . Arthritis   . Benign essential HTN 01/02/2012  . Breast cancer in situ 07/22/2011  . Cancer ([place])    skin  . Confusion   . Depression   . Generalized headaches   . GERD (gastroesophageal reflux disease)   . Hyperlipidemia   . Hyperlipidemia 01/02/2012  . Hypertension   . Memory loss   . Osteoporosis   . Palpitations   . PVC (premature ventricular contraction)   . Rectal pain   . Sleep apnea     Patient Active Problem List   Diagnosis Date Noted  . Severe sepsis ([place]) 12/12/2019  . Sepsis secondary to UTI ([place]) 12/12/2019  . Dementia without behavioral disturbance ([place]) 12/12/2019  . Toxic metabolic encephalopathy [id number]  . Near syncope 12/12/2019  . Aortic atherosclerosis ([place]) 01/03/2017  . Renal calculi 01/03/2017  . Allergic rhinitis 07/07/2015  . MCI (mild cognitive impairment) with memory loss 03/25/2015  . Depression, major,  recurrent, mild ([place]) 03/25/2015  . Generalized anxiety disorder 03/25/2015  . IBS (irritable bowel syndrome) 02/15/2015  . At risk for falling 01/08/2015  . Body mass index (BMI) of 23.0-23.9 in adult 01/08/2015  . H/O malignant neoplasm of breast 01/08/2015  . Chronic constipation 01/08/2015  . Breast cancer of upper-outer quadrant of left female breast ([place]) 01/08/2015  . HZV (herpes zoster virus) post herpetic neuralgia 01/08/2015  . Apnea, sleep 01/08/2015  . Palpitations 08/21/2013  . Benign essential HTN 01/02/2012  . Beat, premature ventricular 04/21/2009  . Absolute anemia 02/05/2009  . Acid reflux 02/05/2009  . Essential (primary) hypertension 02/05/2009  . Bone/cartilage disorder 07/22/2007  . Avitaminosis D 10/08/2006  . Carotid artery obstruction 09/11/1998    Past Surgical History:  Procedure Laterality Date  . ABDOMINAL HYSTERECTOMY  1978  . [place]   left  . breast cancer  10/2010   ductal carcinoma in situ, left breast  . CATARACT EXTRACTION  2011   bilateral  . FOOT SURGERY  2005  . KIDNEY STONE SURGERY  2011  . ROTATOR CUFF REPAIR  2007    Prior to Admission medications   Medication Sig Start Date End Date Taking? Authorizing Provider  acetaminophen (TYLENOL) 325 MG tablet Take 650 mg by mouth in the morning and at bedtime.    Yes [provider]  acetaminophen (TYLENOL) 500 MG tablet Take 500 mg by mouth every 4 (four) hours as  needed for mild pain or fever.   Yes [provider]  Calcium Carb-Cholecalciferol (CALCIUM 600+D) 600-800 MG-UNIT TABS Take 1 tablet by mouth 2 (two) times daily.    Yes [provider]  cholecalciferol (VITAMIN D) 25 MCG tablet Take 2 tablets (2,000 Units total) by mouth daily. 12/17/19  Yes Lorella Nimrod, MD  donepezil (ARICEPT) 10 MG tablet TAKE 1 TABLET BY MOUTH AT BEDTIME Patient taking differently: Take 10 mg by mouth at bedtime.  05/16/17  Yes Garvin Fila, MD  fluticasone  (FLONASE) 50 MCG/ACT nasal spray Place 1 spray into both nostrils daily.    Yes [provider]  loratadine (CLARITIN) 10 MG tablet Take 10 mg by mouth daily.   Yes [provider]  LORazepam (ATIVAN) 0.5 MG tablet Take 0.5 tablets (0.25 mg total) by mouth 2 (two) times daily as needed for anxiety or sleep. 12/16/19  Yes Lorella Nimrod, MD  melatonin 3 MG TABS tablet Take 3 mg by mouth at bedtime.   Yes [provider]  memantine (NAMENDA) 10 MG tablet Take 10 mg by mouth 2 (two) times daily.   Yes [provider]  ondansetron (ZOFRAN) 8 MG tablet Take 8 mg by mouth every 8 (eight) hours as needed for nausea or vomiting.   Yes [provider]  polyethylene glycol (MIRALAX / GLYCOLAX) 17 g packet Take 17 g by mouth daily. 12/17/19  Yes Lorella Nimrod, MD  propranolol (INDERAL) 20 MG tablet Take 1 tablet (20 mg total) by mouth 2 (two) times daily. 01/07/18  Yes Jerrol Banana., MD  Propylene Glycol (SYSTANE COMPLETE OP) Place 1 drop into both eyes in the morning and at bedtime.    Yes [provider]  risperiDONE (RISPERDAL) 0.25 MG tablet Take 0.25 mg by mouth at bedtime.   Yes [provider]  sertraline (ZOLOFT) 100 MG tablet Take 150 mg by mouth daily.   Yes [provider]  vitamin B-12 1000 MCG tablet Take 1 tablet (1,000 mcg total) by mouth daily. 12/17/19  Yes Lorella Nimrod, MD    Allergies Sulfa antibiotics  Family History  Problem Relation Age of Onset  . Cancer Mother        breast  . Crohn's disease Mother   . Cancer Father        lung  . Hypertension Father   . Cancer Sister        breast and uterine  . Hypertension Sister   . Heart disease Maternal Grandmother   . Cancer Maternal Grandfather        colon  . Hypertension Maternal Grandfather   . Heart disease Paternal Grandmother   . Heart disease Paternal Grandfather     Social History Social History   Tobacco Use  . Smoking status: Never Smoker    . Smokeless tobacco: Never Used  Substance Use Topics  . Alcohol use: No  . Drug use: No    Review of Systems  Review of Systems  Unable to perform ROS: Dementia     ____________________________________________  PHYSICAL EXAM:      VITAL SIGNS: ED Triage Vitals [01/07/20 1837]  Enc Vitals Group     BP 138/72     Pulse Rate (!) 101     Resp 18     Temp (!) 97.5 F (36.4 C)     Temp Source Oral     SpO2 96 %     Weight      Height  Head Circumference      Peak Flow      Pain Score 0     Pain Loc      Pain Edu?      Excl. in [place]?      Physical Exam Vitals and nursing note reviewed.  Constitutional:      General: She is not in acute distress.    Appearance: She is well-developed.  HENT:     Head: Normocephalic and atraumatic.     Mouth/Throat:     Mouth: Mucous membranes are dry.  Eyes:     Conjunctiva/sclera: Conjunctivae normal.  Cardiovascular:     Rate and Rhythm: Regular rhythm. Tachycardia present.     Heart sounds: Normal heart sounds.  Pulmonary:     Effort: Pulmonary effort is normal. No respiratory distress.     Breath sounds: No wheezing.  Abdominal:     General: There is no distension.  Musculoskeletal:     Cervical back: Neck supple.  Skin:    General: Skin is warm.     Capillary Refill: Capillary refill takes less than 2 seconds.     Findings: No rash.  Neurological:     Mental Status: She is alert and oriented to person, place, and time.     Motor: No abnormal muscle tone.       ____________________________________________   LABS (all labs ordered are listed, but only abnormal results are displayed)  Labs Reviewed  CBC WITH DIFFERENTIAL/PLATELET - Abnormal; Notable for the following components:      Result Value   WBC 18.5 (*)    RBC 3.52 (*)    Hemoglobin 10.2 (*)    HCT 31.3 (*)    Platelets 141 (*)    Neutro Abs 16.3 (*)    Lymphs Abs 0.5 (*)    Monocytes Absolute 1.5 (*)    Abs Immature Granulocytes 0.16 (*)     All other components within normal limits  COMPREHENSIVE METABOLIC PANEL - Abnormal; Notable for the following components:   Glucose, Bld 167 (*)    BUN 29 (*)    Total Protein 6.1 (*)    Albumin 3.2 (*)    All other components within normal limits  LACTIC ACID, PLASMA - Abnormal; Notable for the following components:   Lactic Acid, Venous 2.4 (*)    All other components within normal limits  URINALYSIS, ROUTINE W REFLEX MICROSCOPIC - Abnormal; Notable for the following components:   Color, Urine YELLOW (*)    APPearance CLOUDY (*)    Glucose, UA 50 (*)    Hgb urine dipstick SMALL (*)    Protein, ur 100 (*)    Leukocytes,Ua LARGE (*)    WBC, UA >50 (*)    All other components within normal limits  BASIC METABOLIC PANEL - Abnormal; Notable for the following components:   Potassium 3.4 (*)    Glucose, Bld 137 (*)    Calcium 8.7 (*)    All other components within normal limits  CBC - Abnormal; Notable for the following components:   WBC 16.8 (*)    RBC 3.42 (*)    Hemoglobin 9.8 (*)    HCT 30.4 (*)    Platelets 131 (*)    All other components within normal limits  RESPIRATORY PANEL BY RT PCR (FLU A&B, COVID)  CULTURE, BLOOD (ROUTINE X 2)  CULTURE, BLOOD (ROUTINE X 2)  URINE CULTURE  LACTIC ACID, PLASMA  PROTIME-INR  PROTIME-INR  URINALYSIS, COMPLETE (UACMP) WITH MICROSCOPIC  CORTISOL-AM, BLOOD  PROCALCITONIN    ____________________________________________  EKG: Normal sinus rhythm, VR 99. QRS 100, QTc 478. No acute St elevation or depression. ________________________________________  RADIOLOGY All imaging, including plain films, CT scans, and ultrasounds, independently reviewed by me, and interpretations confirmed via formal radiology reads.  ED MD interpretation:   CXR: Clear, no acute process  Official radiology report(s): CT Head Wo Contrast  Result Date: 01/07/2020 CLINICAL DATA:  Encephalopathy. Additional provided: Altered mental status, history of  dementia. EXAM: CT HEAD WITHOUT CONTRAST TECHNIQUE: Contiguous axial images were obtained from the base of the skull through the vertex without intravenous contrast. COMPARISON:  Noncontrast head CT 12/11/2016 FINDINGS: Brain: Mild ill-defined hypoattenuation within the cerebral white matter is nonspecific, but consistent with chronic small vessel ischemic disease. Mild generalized parenchymal atrophy. A small chronic lacunar infarct is questioned within the left cerebellar hemisphere (series 4, image 54). Findings are stable as compared to prior head CT 12/12/2019. There is no acute intracranial hemorrhage. No demarcated cortical infarct. No extra-axial fluid collection. No evidence of intracranial mass. No midline shift. Vascular: No hyperdense vessel.  Atherosclerotic calcifications. Skull: Normal. Negative for fracture or focal lesion. Sinuses/Orbits: Visualized orbits show no acute finding. No significant paranasal sinus disease or mastoid effusion at the imaged levels. IMPRESSION: 1. No evidence of acute intracranial abnormality. 2. Stable, mild generalized parenchymal atrophy and background chronic small vessel ischemic disease. A small chronic lacunar infarct is questioned within the left cerebellum and, in retrospect, this finding was present on prior head CT 12/12/2019. Electronically Signed   By: Kellie Simmering DO   On: 01/07/2020 19:20   DG Chest Portable 1 View  Result Date: 01/07/2020 CLINICAL DATA:  Shortness of breath EXAM: PORTABLE CHEST 1 VIEW COMPARISON:  12/12/2019 FINDINGS: Patient is rotated. Chronic interstitial prominence. No new consolidation or edema. Pleural effusion or pneumothorax. Stable cardiomediastinal contours. IMPRESSION: No acute process in the chest. Electronically Signed   By: Macy Mis M.D.   On: 01/07/2020 19:26    ____________________________________________  PROCEDURES   Procedure(s) performed (including Critical Care):  .1-3 Lead EKG  Interpretation Performed by: Duffy Bruce, MD Authorized by: Duffy Bruce, MD     Interpretation: non-specific     ECG rate:  100-110   ECG rate assessment: tachycardic     Rhythm: sinus rhythm     Ectopy: none     Conduction: normal   Comments:     Indication: Sepsis, altered mental status .Critical Care Performed by: Duffy Bruce, MD Authorized by: Duffy Bruce, MD   Critical care provider statement:    Critical care time (minutes):  35   Critical care time was exclusive of:  Separately billable procedures and treating other patients and teaching time   Critical care was necessary to treat or prevent imminent or life-threatening deterioration of the following conditions:  Cardiac failure, circulatory failure, respiratory failure and sepsis   Critical care was time spent personally by me on the following activities:  Development of treatment plan with patient or surrogate, discussions with consultants, evaluation of patient's response to treatment, examination of patient, obtaining history from patient or surrogate, ordering and performing treatments and interventions, ordering and review of laboratory studies, ordering and review of radiographic studies, pulse oximetry, re-evaluation of patient's condition and review of old charts   I assumed direction of critical care for this patient from another provider in my specialty: no      ____________________________________________  INITIAL IMPRESSION / MDM / ASSESSMENT AND PLAN / ED  COURSE  As part of my medical decision making, I reviewed the following data within the [place] notes reviewed and incorporated, Old chart reviewed, Notes from prior ED visits, and [place] Controlled Substance Database       *ELIZIBETH FRUTOS was evaluated in Emergency Department on 01/08/2020 for the symptoms described in the history of present illness. She was evaluated in the context of the global COVID-19 pandemic, which  necessitated consideration that the patient might be at risk for infection with the SARS-CoV-2 virus that causes COVID-19. Institutional protocols and algorithms that pertain to the evaluation of patients at risk for COVID-19 are in a state of rapid change based on information released by regulatory bodies including the CDC and federal and state organizations. These policies and algorithms were followed during the patient's care in the ED.  Some ED evaluations and interventions may be delayed as a result of limited staffing during the pandemic.*     Medical Decision Making: 84 year old female with history of dementia here with tachycardia and fever with altered mental status.  Suspect sepsis, likely UTI given her history.  Reviewed her old records and labs.  She is normally more awake and alert per report, but has no focal neurological deficits and I do not suspect stroke.  CT head is negative.  Given her fever and tachycardia, sepsis protocol initiated with initial broad-spectrum antibiotics and 30 cc/kg of fluids.  She does appear clinically dehydrated. Abdomen is otherwise soft, NT, and nondistended without signs of obstruction, perforation, or other intra-abd emergency. Will tx for UTI, admit.   ____________________________________________  FINAL CLINICAL IMPRESSION(S) / ED DIAGNOSES  Final diagnoses:  Sepsis due to urinary tract infection ([place])     MEDICATIONS GIVEN DURING THIS VISIT:  Medications  enoxaparin (LOVENOX) injection 40 mg (40 mg Subcutaneous Given 01/07/20 2136)  lactated ringers infusion ( Intravenous New Bag/Given 01/08/20 0027)  acetaminophen (TYLENOL) tablet 650 mg (has no administration in time range)    Or  acetaminophen (TYLENOL) suppository 650 mg (has no administration in time range)  senna-docusate (Senokot-S) tablet 1 tablet (has no administration in time range)  ondansetron (ZOFRAN) tablet 4 mg (has no administration in time range)    Or  ondansetron (ZOFRAN)  injection 4 mg (has no administration in time range)  ceFEPIme (MAXIPIME) 2 g in sodium chloride 0.9 % 100 mL IVPB (has no administration in time range)  ceFEPIme (MAXIPIME) 2 g in sodium chloride 0.9 % 100 mL IVPB (0 g Intravenous Stopped 01/07/20 2010)  metroNIDAZOLE (FLAGYL) IVPB 500 mg (0 mg Intravenous Stopped 01/07/20 2132)  vancomycin (VANCOCIN) IVPB 1000 mg/200 mL premix (0 mg Intravenous Stopped 01/07/20 2249)  lactated ringers bolus 1,000 mL (0 mLs Intravenous Stopped 01/07/20 2022)    And  lactated ringers bolus 500 mL (0 mLs Intravenous Stopped 01/07/20 2130)    And  lactated ringers bolus 250 mL (0 mLs Intravenous Stopped 01/07/20 2152)  acetaminophen (TYLENOL) suppository 650 mg (650 mg Rectal Given 01/07/20 1935)     ED Discharge Orders    None       Note:  This document was prepared using Dragon voice recognition software and may include unintentional dictation errors.   Duffy Bruce, MD 01/08/20 [phone]

## 2020-01-07 NOTE — ED Triage Notes (Signed)
Pt BIB ems from [facility] with increased altered mental status. Pt has history of dementia and will answer and follow some commands, but not all. Pt received 525mL of NS by EMS. EMS states pts stroke assessment was negative. EMS states pt had increased AMS starting at 1pm today

## 2020-01-07 NOTE — ED Notes (Signed)
Pt taken to floor room with RN, on stretcher and monitor.

## 2020-01-08 ENCOUNTER — Encounter: Payer: Self-pay | Admitting: Internal Medicine

## 2020-01-08 DIAGNOSIS — R7881 Bacteremia: Secondary | ICD-10-CM

## 2020-01-08 DIAGNOSIS — F0390 Unspecified dementia without behavioral disturbance: Secondary | ICD-10-CM

## 2020-01-08 HISTORY — DX: Bacteremia: R78.81

## 2020-01-08 LAB — CBC
HCT: 30.4 % — ABNORMAL LOW (ref 36.0–46.0)
Hemoglobin: 9.8 g/dL — ABNORMAL LOW (ref 12.0–15.0)
MCH: 28.7 pg (ref 26.0–34.0)
MCHC: 32.2 g/dL (ref 30.0–36.0)
MCV: 88.9 fL (ref 80.0–100.0)
Platelets: 131 10*3/uL — ABNORMAL LOW (ref 150–400)
RBC: 3.42 MIL/uL — ABNORMAL LOW (ref 3.87–5.11)
RDW: 14 % (ref 11.5–15.5)
WBC: 16.8 10*3/uL — ABNORMAL HIGH (ref 4.0–10.5)
nRBC: 0 % (ref 0.0–0.2)

## 2020-01-08 LAB — PROTIME-INR
INR: 1.1 (ref 0.8–1.2)
Prothrombin Time: 13.3 seconds (ref 11.4–15.2)

## 2020-01-08 LAB — CORTISOL-AM, BLOOD: Cortisol - AM: 59.8 ug/dL — ABNORMAL HIGH (ref 6.7–22.6)

## 2020-01-08 LAB — BLOOD CULTURE ID PANEL (REFLEXED)

## 2020-01-08 LAB — BASIC METABOLIC PANEL
Anion gap: 5 (ref 5–15)
BUN: 23 mg/dL (ref 8–23)
CO2: 28 mmol/L (ref 22–32)
Calcium: 8.7 mg/dL — ABNORMAL LOW (ref 8.9–10.3)
Chloride: 102 mmol/L (ref 98–111)
Creatinine, Ser: 0.59 mg/dL (ref 0.44–1.00)
GFR calc Af Amer: >60 mL/min (ref >60)
GFR calc non Af Amer: >60 mL/min (ref >60)
Glucose, Bld: 137 mg/dL — ABNORMAL HIGH (ref 70–99)
Potassium: 3.4 mmol/L — ABNORMAL LOW (ref 3.5–5.1)
Sodium: 135 mmol/L (ref 135–145)

## 2020-01-08 LAB — MRSA PCR SCREENING: MRSA by PCR: NEGATIVE

## 2020-01-08 LAB — PROCALCITONIN: Procalcitonin: 4.04 ng/mL

## 2020-01-08 LAB — LACTIC ACID, PLASMA: Lactic Acid, Venous: 2.4 mmol/L (ref 0.5–1.9)

## 2020-01-08 MED ORDER — BOOST / RESOURCE BREEZE PO LIQD CUSTOM
1.0000 | Freq: Every day | ORAL | Status: DC
  Administered 2020-01-09 – 2020-01-13 (×4): 1 via ORAL

## 2020-01-08 MED ORDER — SODIUM CHLORIDE 0.9 % IV SOLN
2.0000 g | INTRAVENOUS | Status: DC
  Filled 2020-01-08 (×2): qty 20

## 2020-01-08 MED ORDER — POLYVINYL ALCOHOL 1.4 % OP SOLN
1.0000 [drp] | OPHTHALMIC | Status: DC | PRN
  Filled 2020-01-08: qty 15

## 2020-01-08 MED ORDER — DONEPEZIL HCL 5 MG PO TABS
10.0000 mg | ORAL_TABLET | Freq: Every day | ORAL | Status: DC
  Administered 2020-01-08 – 2020-01-12 (×5): 10 mg via ORAL
  Filled 2020-01-08 (×6): qty 2

## 2020-01-08 MED ORDER — RISPERIDONE 0.25 MG PO TABS
0.2500 mg | ORAL_TABLET | Freq: Every day | ORAL | Status: DC
  Administered 2020-01-08 – 2020-01-12 (×5): 0.25 mg via ORAL
  Filled 2020-01-08 (×6): qty 1

## 2020-01-08 MED ORDER — SERTRALINE HCL 50 MG PO TABS
150.0000 mg | ORAL_TABLET | Freq: Every day | ORAL | Status: DC
  Administered 2020-01-08 – 2020-01-13 (×6): 150 mg via ORAL
  Filled 2020-01-08 (×6): qty 3

## 2020-01-08 MED ORDER — POTASSIUM CHLORIDE CRYS ER 20 MEQ PO TBCR
40.0000 meq | EXTENDED_RELEASE_TABLET | Freq: Once | ORAL | Status: AC
  Administered 2020-01-08: 40 meq via ORAL
  Filled 2020-01-08: qty 2

## 2020-01-08 MED ORDER — MEMANTINE HCL 5 MG PO TABS
10.0000 mg | ORAL_TABLET | Freq: Two times a day (BID) | ORAL | Status: DC
  Administered 2020-01-08 – 2020-01-13 (×11): 10 mg via ORAL
  Filled 2020-01-08 (×11): qty 2

## 2020-01-08 MED ORDER — SODIUM CHLORIDE 0.9 % IV SOLN
1.0000 g | INTRAVENOUS | Status: DC
  Filled 2020-01-08 (×2): qty 10

## 2020-01-08 MED ORDER — LORATADINE 10 MG PO TABS
10.0000 mg | ORAL_TABLET | Freq: Every day | ORAL | Status: DC
  Administered 2020-01-08 – 2020-01-13 (×6): 10 mg via ORAL
  Filled 2020-01-08 (×6): qty 1

## 2020-01-08 MED ORDER — VITAMIN D 25 MCG (1000 UNIT) PO TABS
2000.0000 [IU] | ORAL_TABLET | Freq: Every day | ORAL | Status: DC
  Administered 2020-01-08 – 2020-01-13 (×6): 2000 [IU] via ORAL
  Filled 2020-01-08 (×6): qty 2

## 2020-01-08 MED ORDER — SODIUM CHLORIDE 0.9 % IV SOLN
2.0000 g | Freq: Two times a day (BID) | INTRAVENOUS | Status: DC
  Administered 2020-01-08 – 2020-01-13 (×10): 2 g via INTRAVENOUS
  Filled 2020-01-08 (×11): qty 2

## 2020-01-08 NOTE — Progress Notes (Signed)
PHARMACY - PHYSICIAN COMMUNICATION CRITICAL VALUE ALERT - BLOOD CULTURE IDENTIFICATION (BCID)  Brandi Hanson is an 84 y.o. female who presented to [facility] on 01/07/2020 with a chief complaint of UTI/Seosis/Bacteremia  Assessment:  2/4 bottles GNR (1 set) - no BCID identified (include suspected source if known)  Name of physician (or Provider) Contacted: Sarajane Jews  Current antibiotics: Ceftriaxone 1g q24h  Changes to prescribed antibiotics recommended: Ceftriaxione 2g q24h :Recommendations accepted by provider   Lu Duffel, PharmD, BCPS Clinical Pharmacist 01/08/2020 3:46 PM

## 2020-01-08 NOTE — Plan of Care (Signed)
Patient is disoriented times 4 and not very interactive. Skin is intact.  Brandi Hanson

## 2020-01-08 NOTE — Progress Notes (Signed)
PHARMACY - PHYSICIAN COMMUNICATION CRITICAL VALUE ALERT - BLOOD CULTURE IDENTIFICATION (BCID)  Brandi Hanson is an 84 y.o. female who presented to [facility] on 01/07/2020 with a chief complaint of Sepsis secondary to UTI   Assessment:  Pseudomonas growing in 2 of 4 bottles (include suspected source if known)  Name of physician (or Provider) Contacted: Sarajane Jews  Current antibiotics: Ceftriaxone 1 gm IV Q24H   Changes to prescribed antibiotics recommended:  Recommendations accepted by provider   Will Hanson/c ceftriaxone and start Cefepime 2 gm IV Q12H @ 1930.   Results for orders placed or performed during the hospital encounter of 01/07/20  Blood Culture ID Panel (Reflexed) (Collected: 01/07/2020  6:53 PM)  Result Value Ref Range   Enterococcus species NOT DETECTED NOT DETECTED   Listeria monocytogenes NOT DETECTED NOT DETECTED   Staphylococcus species NOT DETECTED NOT DETECTED   Staphylococcus aureus (BCID) NOT DETECTED NOT DETECTED   Streptococcus species NOT DETECTED NOT DETECTED   Streptococcus agalactiae NOT DETECTED NOT DETECTED   Streptococcus pneumoniae NOT DETECTED NOT DETECTED   Streptococcus pyogenes NOT DETECTED NOT DETECTED   Acinetobacter baumannii NOT DETECTED NOT DETECTED   Enterobacteriaceae species NOT DETECTED NOT DETECTED   Enterobacter cloacae complex NOT DETECTED NOT DETECTED   Escherichia coli NOT DETECTED NOT DETECTED   Klebsiella oxytoca NOT DETECTED NOT DETECTED   Klebsiella pneumoniae NOT DETECTED NOT DETECTED   Proteus species NOT DETECTED NOT DETECTED   Serratia marcescens NOT DETECTED NOT DETECTED   Carbapenem resistance NOT DETECTED NOT DETECTED   Haemophilus influenzae NOT DETECTED NOT DETECTED   Neisseria meningitidis NOT DETECTED NOT DETECTED   Pseudomonas aeruginosa DETECTED (A) NOT DETECTED   Candida albicans NOT DETECTED NOT DETECTED   Candida glabrata NOT DETECTED NOT DETECTED   Candida krusei NOT DETECTED NOT DETECTED   Candida  parapsilosis NOT DETECTED NOT DETECTED   Candida tropicalis NOT DETECTED NOT DETECTED    Brandi Hanson 01/08/2020  4:41 PM

## 2020-01-08 NOTE — Evaluation (Signed)
Physical Therapy Evaluation Patient Details Name: Brandi Hanson MRN: [id number] DOB: 08-10-1936 Today's Date: 01/08/2020   History of Present Illness  Brandi Hanson is a 84 y.o. female with medical history significant for Advanced dementia, hypertension, GERD, hospitalized from 4/2 - 12/16/19 with severe sepsis secondary to Klebsiella UTI, presenting with altered mental status, treated with ceftriaxone then Augmentin who presents to the emergency room with altered mental status.  Patient unable to give a history due to advanced dementia.  Clinical Impression  Patient is received in bed, alert, not oriented. Not able to provide any information due to severe dementia. She is apparently from a memory care unit ( [facility]). She required total assist for bed mobility this day. Unable to follow instruction. She may benefit from another visit to see if mobility/mentation improves. Otherwise she should return to memory care unit for 24 hour assist.        Follow Up Recommendations SNF(memory care unit)    Equipment Recommendations  None recommended by PT    Recommendations for Other Services       Precautions / Restrictions Precautions Precautions: Fall Restrictions Weight Bearing Restrictions: No      Mobility  Bed Mobility Overal bed mobility: Needs Assistance             General bed mobility comments: patient it total assist for bed mobility, she cannot follow instruction for mobility due to severe dementia  Transfers                 General transfer comment: not attempted due to poor sitting balance and patient wanting to lie back down  Ambulation/Gait             General Gait Details: not attempted this visit  Stairs            Wheelchair Mobility    Modified Rankin (Stroke Patients Only)       Balance Overall balance assessment: Needs assistance   Sitting balance-Leahy Scale: Poor Sitting balance - Comments: patient requires max assistance  to get to edge of bed and to maintain sitting. She is very stiff with posture. Postural control: Posterior lean                                   Pertinent Vitals/Pain Pain Assessment: No/denies pain    Home Living Family/patient expects to be discharged to:: Assisted living                 Additional Comments: patient lives in ALF. Unable to provide prior mobility or equipment    Prior Function           Comments: patient unable to provide any history, no family present     Hand Dominance        Extremity/Trunk Assessment   Upper Extremity Assessment Upper Extremity Assessment: Generalized weakness    Lower Extremity Assessment Lower Extremity Assessment: Generalized weakness    Cervical / Trunk Assessment Cervical / Trunk Assessment: Normal  Communication   Communication: Other (comment)(due to confusion, patient does not answer questions appropriately.)  Cognition Arousal/Alertness: Awake/alert Behavior During Therapy: Flat affect Overall Cognitive Status: No family/caregiver present to determine baseline cognitive functioning                                 General Comments: history of advanced dementia  General Comments      Exercises     Assessment/Plan    PT Assessment Patient needs continued PT services  PT Problem List Decreased strength;Decreased mobility;Decreased balance;Decreased activity tolerance;Decreased safety awareness;Decreased cognition       PT Treatment Interventions Gait training;Therapeutic activities;Therapeutic exercise;Functional mobility training;Patient/family education    PT Goals (Current goals can be found in the Care Plan section)  Acute Rehab PT Goals Patient Stated Goal: not able to state PT Goal Formulation: Patient unable to participate in goal setting Time For Goal Achievement: 01/15/20    Frequency Min 2X/week   Barriers to discharge        Co-evaluation                AM-PAC PT "6 Clicks" Mobility  Outcome Measure Help needed turning from your back to your side while in a flat bed without using bedrails?: Total Help needed moving from lying on your back to sitting on the side of a flat bed without using bedrails?: Total Help needed moving to and from a bed to a chair (including a wheelchair)?: Total Help needed standing up from a chair using your arms (e.g., wheelchair or bedside chair)?: Total Help needed to walk in hospital room?: Total Help needed climbing 3-5 steps with a railing? : Total 6 Click Score: 6    End of Session   Activity Tolerance: Other (comment)(patient limited by cognition) Patient left: in bed;with bed alarm set Nurse Communication: Mobility status PT Visit Diagnosis: Other abnormalities of gait and mobility (R26.89);Muscle weakness (generalized) (M62.81);Other (comment)(cognitive limitations)    Time: 1130-1147 PT Time Calculation (min) (ACUTE ONLY): 17 min   Charges:   PT Evaluation $PT Eval Moderate Complexity: 1 Mod PT Treatments $Therapeutic Activity: 8-22 mins        Brandi Hanson, PT, GCS 01/08/20,11:59 AM

## 2020-01-08 NOTE — Progress Notes (Signed)
Pharmacy Antibiotic Note  Brandi Hanson is a 84 y.o. female admitted on 01/07/2020 with sepsis secondary to UTI .  Pharmacy has been consulted for Cefepime dosing.  Pseudomonas growing in 2 of 4 bottles.   Plan: Cefepime 2 gm IV Q12H to start @ 1930.   Height: 5\' 6"  (167.6 cm) Weight: 54.3 kg (119 lb 11.2 oz) IBW/kg (Calculated) : 59.3  Temp (24hrs), Avg:99.4 F (37.4 C), Min:97.5 F (36.4 C), Max:102.4 F (39.1 C)  Recent Labs  Lab 01/07/20 1841 01/07/20 2041 01/08/20 0029  WBC 18.5*  --  16.8*  CREATININE 0.66  --  0.59  LATICACIDVEN  --  1.7 2.4*    Estimated Creatinine Clearance: 47.3 mL/min (by C-G formula based on SCr of 0.59 mg/dL).    Allergies  Allergen Reactions  . Sulfa Antibiotics Nausea Only    Antimicrobials this admission:   >>    >>   Dose adjustments this admission:   Microbiology results:  BCx:   UCx:    Sputum:    MRSA PCR:   Thank you for allowing pharmacy to be a part of this patient's care.  [name] D 01/08/2020 4:43 PM

## 2020-01-08 NOTE — Care Management Important Message (Signed)
Important Message  Patient Details  Name: BRICEIDA ELFRINK MRN: [id number] Date of Birth: 1936/02/04   Medicare Important Message Given:  Yes  Initial Medicare IM given by Patient Access Associate on 01/08/2020 at 10:53am.     [name] 01/08/2020, 2:56 PM

## 2020-01-08 NOTE — Progress Notes (Signed)
PROGRESS NOTE  Brandi Hanson [id number] DOB: October 28, 1935 DOA: 01/07/2020 PCP: System, Pcp Not In  Brief History   84 year old woman PMH includes advanced dementia recently hospitalized with discharge 4/6 for severe sepsis secondary to Klebsiella UTI, who presented to the emergency room with altered mental status, was found to be febrile with leukocytosis and was admitted for sepsis secondary to UTI with toxic metabolic encephalopathy.  A & P  Sepsis secondary to gram-negative rod bacteremia, presumably secondary to UTI with associated toxic metabolic encephalopathy. --Continue empiric antibiotics, follow-up culture data  Essential hypertension --Appears stable.  Dementia without behavioral disturbance --Continue home Aricept, Ativan, Namenda, Resporal, Zoloft  Disposition Plan:   Status is: Inpatient  Remains inpatient appropriate because:Ongoing diagnostic testing needed not appropriate for outpatient work up and IV treatments appropriate due to intensity of illness or inability to take PO   Dispo: The patient is from: SNF              Anticipated d/c is to: [name], SNF, memory care              Anticipated d/c date is: 2 days              Patient currently is not medically stable to d/c.  DVT prophylaxis: enoxaparin Code Status: DNR Family Communication: nephew Elta Guadeloupe by phone    Murray Hodgkins, MD  [facility] Direct contact: see [email or web address] (further directions at bottom of note if needed) 7PM-7AM contact night coverage as at bottom of note 01/08/2020, 3:39 PM  LOS: 1 day   Significant Hospital Events   .    Consults:  .    Procedures:  .   Significant Diagnostic Tests:  Marland Kitchen    Micro Data:  .    Antimicrobials:  .   Interval History/Subjective  Seems to feel fine, no complaints.  Clearly confused.  History does not appear reliable.  Objective   Vitals:  Vitals:   01/08/20 0744 01/08/20 1159  BP: (!) 107/49 (!) 116/58  Pulse: 87 82   Resp:  18  Temp: 100 F (37.8 C) 99.5 F (37.5 C)  SpO2: 94% 99%    Exam:  Constitutional.  Appears calm, comfortable. Respiratory.  Clear to auscultation bilaterally.  No wheezes, rales or rhonchi.  Normal respiratory effort. Cardiovascular.  Regular rate and rhythm.  No murmur, rub or gallop. Psychiatric.  Grossly normal mood and affect.  Speech fluent and appropriate.  I have personally reviewed the following:   Today's Data  . Cultures growing gram-negative rods in blood . Potassium 3.4 remainder BMP unremarkable . WBC up to 16.8, hemoglobin stable at 9.8  Scheduled Meds: . cholecalciferol  2,000 Units Oral Daily  . donepezil  10 mg Oral QHS  . enoxaparin (LOVENOX) injection  40 mg Subcutaneous Q24H  . loratadine  10 mg Oral Daily  . memantine  10 mg Oral BID  . potassium chloride  40 mEq Oral Once  . risperiDONE  0.25 mg Oral QHS  . sertraline  150 mg Oral Daily   Continuous Infusions: . cefTRIAXone (ROCEPHIN)  IV    . lactated ringers 100 mL/hr at 01/08/20 1237    Principal Problem:   Sepsis secondary to UTI [facility]) Active Problems:   Essential (primary) hypertension   Dementia without behavioral disturbance (HCC)   Toxic metabolic encephalopathy   LOS: 1 day   How to contact the [facility] Attending or Consulting provider 7A - 7P or covering provider during after hours 7P -7A,  for this patient?  1. Check the care team in [facility] and look for a) attending/consulting TRH provider listed and b) the [facility] team listed 2. Log into [email or web address] and use [place]'s universal password to access. If you do not have the password, please contact the hospital operator. 3. Locate the [facility] provider you are looking for under [facility] and page to a number that you can be directly reached. 4. If you still have difficulty reaching the provider, please page the [facility] (Director on Call) for the Hospitalists listed on amion for assistance.

## 2020-01-09 DIAGNOSIS — G92 Toxic encephalopathy: Secondary | ICD-10-CM

## 2020-01-09 LAB — URINE CULTURE: Culture: >=100000 — AB

## 2020-01-09 LAB — CBC
HCT: 29 % — ABNORMAL LOW (ref 36.0–46.0)
Hemoglobin: 9.4 g/dL — ABNORMAL LOW (ref 12.0–15.0)
MCH: 28.8 pg (ref 26.0–34.0)
MCHC: 32.4 g/dL (ref 30.0–36.0)
MCV: 89 fL (ref 80.0–100.0)
Platelets: 107 10*3/uL — ABNORMAL LOW (ref 150–400)
RBC: 3.26 MIL/uL — ABNORMAL LOW (ref 3.87–5.11)
RDW: 14.2 % (ref 11.5–15.5)
WBC: 10.2 10*3/uL (ref 4.0–10.5)
nRBC: 0 % (ref 0.0–0.2)

## 2020-01-09 NOTE — TOC Initial Note (Signed)
Transition of Care [facility]) - Initial/Assessment Note    Patient Details  Name: Brandi Hanson MRN: [id number] Date of Birth: Oct 22, 1935  [facility] of [facility]) CM/SW Contact:    Candie Chroman, LCSW Phone Number: 01/09/2020, 3:12 PM  Clinical Narrative: Patient is not oriented at all. CSW called patient's nephew, introduced role, and explained that discharge planning would be discussed. Patient is from [facility] on their memory care unit. She is getting home health PT and OT through [facility]. Discussed potential for SNF but nephew said last time therapy did not think insurance would approve it due to it not being beneficial for her. Sent message to PT that saw her yesterday to determine her thoughts about it. Left voicemail for ALF administrator, Ed Weeks. Nephew hopeful she can ultimately return there at discharge. No further concerns. CSW encouraged patient's nephew to contact CSW as needed. CSW will continue to follow patient for support and facilitate discharge to ALF vs. SNF when medically stable.                Expected Discharge Plan: Assisted Living(Memory Care with home health.) Barriers to Discharge: Continued Medical Work up   Patient Goals and CMS Choice        Expected Discharge Plan and Services Expected Discharge Plan: Assisted Living(Memory Care with home health.)       Living arrangements for the past 2 months: [place]                                      Prior Living Arrangements/Services Living arrangements for the past 2 months: [place] Lives with:: Facility Resident Patient language and need for interpreter reviewed:: Yes Do you feel safe going back to the place where you live?: Yes      Need for Family Participation in Patient Care: Yes (Comment) Care giver support system in place?: Yes (comment) Current home services: Home OT, Home PT Criminal Activity/Legal Involvement Pertinent to Current  Situation/Hospitalization: No - Comment as needed  Activities of Daily Living      Permission Sought/Granted Permission sought to share information with : Facility Sport and exercise psychologist, Family Supports    Share Information with NAME: Gordy Clement  Permission granted to share info w AGENCY: [name]  Permission granted to share info w Relationship: Nephew  Permission granted to share info w Contact Information: [phone]  Emotional Assessment Appearance:: Appears stated age Attitude/Demeanor/Rapport: Unable to Assess Affect (typically observed): Unable to Assess Orientation: : (Disoriented x 4) Alcohol / Substance Use: Not Applicable Psych Involvement: No (comment)  Admission diagnosis:  Sepsis secondary to UTI ([place]) [A41.9, N39.0] Sepsis due to urinary tract infection ([place]) [A41.9, N39.0] Patient Active Problem List   Diagnosis Date Noted  . Bacteremia due to Gram-negative bacteria 01/08/2020  . Severe sepsis ([place]) 12/12/2019  . Sepsis secondary to UTI ([place]) 12/12/2019  . Dementia without behavioral disturbance ([place]) 12/12/2019  . Toxic metabolic encephalopathy [id number]  . Near syncope 12/12/2019  . Aortic atherosclerosis ([place]) 01/03/2017  . Renal calculi 01/03/2017  . Allergic rhinitis 07/07/2015  . MCI (mild cognitive impairment) with memory loss 03/25/2015  . Depression, major, recurrent, mild ([place]) 03/25/2015  . Generalized anxiety disorder 03/25/2015  . IBS (irritable bowel syndrome) 02/15/2015  . At risk for falling 01/08/2015  . Body mass index (BMI) of 23.0-23.9 in adult 01/08/2015  . H/O malignant neoplasm of breast  01/08/2015  . Chronic constipation 01/08/2015  . Breast cancer of upper-outer quadrant of left female breast ([place]) 01/08/2015  . HZV (herpes zoster virus) post herpetic neuralgia 01/08/2015  . Apnea, sleep 01/08/2015  . Palpitations 08/21/2013  . Benign essential HTN 01/02/2012  . Beat, premature ventricular 04/21/2009  . Absolute  anemia 02/05/2009  . Acid reflux 02/05/2009  . Essential (primary) hypertension 02/05/2009  . Bone/cartilage disorder 07/22/2007  . Avitaminosis D 10/08/2006  . Carotid artery obstruction 09/11/1998   PCP:  System, Pcp Not In Pharmacy:   [facility], [address] [place] [address] Phone: [phone] Fax: [phone]  [facility], [address] [place] [address] Phone: [phone] Fax: [phone]     Social Determinants of Health (SDOH) Interventions    Readmission Risk Interventions No flowsheet data found.

## 2020-01-09 NOTE — Progress Notes (Signed)
PROGRESS NOTE  Brandi Hanson [id number] DOB: 1936-03-24 DOA: 01/07/2020 PCP: System, Pcp Not In  Brief History   84 year old woman PMH includes advanced dementia recently hospitalized with discharge 4/6 for severe sepsis secondary to Klebsiella UTI, who presented to the emergency room with altered mental status, was found to be febrile with leukocytosis and was admitted for sepsis secondary to UTI with toxic metabolic encephalopathy.  A & P  Sepsis secondary to Pseudomonas bacteremia, secondary to Pseudomonas UTI with associated toxic metabolic encephalopathy. --Hemodynamics stable.  Seems to be improving on cefepime.  Unclear how far off baseline given dementia.  Essential hypertension --Appears stable  Dementia without behavioral disturbance --We will continue home Aricept, Ativan, Namenda, Resporal, Zoloft  Disposition Plan:   Status is: Inpatient  Remains inpatient appropriate because:Ongoing diagnostic testing needed not appropriate for outpatient work up and IV treatments appropriate due to intensity of illness or inability to take PO   Dispo: The patient is from: SNF              Anticipated d/c is to: [name], memory care              Anticipated d/c date is: 2 days              Patient currently is not medically stable to d/c.  DVT prophylaxis: enoxaparin Code Status: DNR Family Communication: nephew Elta Guadeloupe by phone 4/30    Murray Hodgkins, MD  [facility] Direct contact: see [email or web address] (further directions at bottom of note if needed) 7PM-7AM contact night coverage as at bottom of note 01/09/2020, 5:53 PM  LOS: 2 days   Significant Hospital Events   .    Consults:  .    Procedures:  .   Significant Diagnostic Tests:  Marland Kitchen    Micro Data:  . Urine culture Pseudomonas . Blood cultures Pseudomonas   Antimicrobials:  . Cefepime 4/28 >  Interval History/Subjective  Continues to feel okay today.  No complaints voiced.  Confused.  Objective    Vitals:  Vitals:   01/09/20 0557 01/09/20 1151  BP: (!) 141/74 (!) 142/85  Pulse: 78 79  Resp: 18   Temp: 97.7 F (36.5 C) 97.7 F (36.5 C)  SpO2: 94% 98%    Exam:  Constitutional.  Appears calm, comfortable. Respiratory.  Clear to auscultation bilaterally.  No wheezes, rales or rhonchi.  Normal respiratory effort. Cardiovascular.  Regular rate and rhythm.  No murmur, rub or gallop.  No lower extremity edema Psychiatric.  Confused but tries to answer questions.  Speech is fluent and clear but not appropriate. Musculoskeletal.  Grossly normal tone all extremities.  I have personally reviewed the following:   Today's Data  . Culture data noted . Hemoglobin 9.4, stable.  Platelets slightly lower at 131.  Scheduled Meds: . cholecalciferol  2,000 Units Oral Daily  . donepezil  10 mg Oral QHS  . enoxaparin (LOVENOX) injection  40 mg Subcutaneous Q24H  . feeding supplement  1 Container Oral Daily  . loratadine  10 mg Oral Daily  . memantine  10 mg Oral BID  . risperiDONE  0.25 mg Oral QHS  . sertraline  150 mg Oral Daily   Continuous Infusions: . ceFEPime (MAXIPIME) IV 2 g (01/09/20 [id number])  . lactated ringers 100 mL/hr at 01/09/20 [id number]    Principal Problem:   Sepsis secondary to UTI [facility]) Active Problems:   Essential (primary) hypertension   Dementia without behavioral disturbance (HCC)   Toxic metabolic encephalopathy  Bacteremia due to Gram-negative bacteria   LOS: 2 days   How to contact the [facility] Attending or Consulting provider [place] or covering provider during after hours [place], for this patient?  1. Check the care team in [facility] and look for a) attending/consulting TRH provider listed and b) the [facility] team listed 2. Log into [email or web address] and use [place]'s universal password to access. If you do not have the password, please contact the hospital operator. 3. Locate the [facility] provider you are looking for under [facility] and page to a number that you can  be directly reached. 4. If you still have difficulty reaching the provider, please page the [facility] (Director on Call) for the Hospitalists listed on amion for assistance.

## 2020-01-09 NOTE — Progress Notes (Signed)
Placed mittens on patient because she pulled out 2 IVs and her external catheter. She has not sleep all night and tried to get out of bed on several occasions throughout the night.  Will continue to monitor.  Christene Slates

## 2020-01-09 NOTE — Progress Notes (Signed)
Pharmacy Antibiotic Note  Brandi Hanson is a 84 y.o. female admitted on 01/07/2020 with sepsis secondary to UTI .  Pharmacy has been consulted for Cefepime dosing.  Pseudomonas growing in 2 of 4 bottles. Additional bottle growing GNR, presumed Pseudomonas also. UCx also growing 100k GNR(presumed Pseudomonas, as well)   This is Day 3 of antibiotics.  Plan: Will continue Cefepime 2 gm IV Q12H as patient is clinically improving and await susceptibility results to further de-escalate therapy.   Height: 5\' 6"  (167.6 cm) Weight: 54.3 kg (119 lb 11.2 oz) IBW/kg (Calculated) : 59.3  Temp (24hrs), Avg:98 F (36.7 C), Min:97.7 F (36.5 C), Max:98.5 F (36.9 C)  Recent Labs  Lab 01/07/20 1841 01/07/20 2041 01/08/20 0029 01/09/20 0438  WBC 18.5*  --  16.8* 10.2  CREATININE 0.66  --  0.59  --   LATICACIDVEN  --  1.7 2.4*  --     Estimated Creatinine Clearance: 47.3 mL/min (by C-G formula based on SCr of 0.59 mg/dL).    Allergies  Allergen Reactions  . Sulfa Antibiotics Nausea Only    Antimicrobials this admission: Cefepime 4/28 >>  Flagyl/Vanc  x1   Dose adjustments this admission:   Microbiology results:  BCx: 3/4 GNR(2/4 Pseudomonas)  UCx:  100k GNR  COVID PCR: negative  MRSA PCR: negative  Thank you for allowing pharmacy to be a part of this patient's care.  Joeann Steppe A Sydne Krahl 01/09/2020 1:03 PM

## 2020-01-10 ENCOUNTER — Other Ambulatory Visit: Payer: Self-pay

## 2020-01-10 ENCOUNTER — Encounter: Payer: Self-pay | Admitting: Internal Medicine

## 2020-01-10 LAB — CULTURE, BLOOD (ROUTINE X 2)
Special Requests: ADEQUATE
Special Requests: ADEQUATE

## 2020-01-10 MED ORDER — LORAZEPAM 0.5 MG PO TABS
0.2500 mg | ORAL_TABLET | Freq: Two times a day (BID) | ORAL | Status: DC | PRN
  Administered 2020-01-10: 0.25 mg via ORAL
  Filled 2020-01-10 (×2): qty 1

## 2020-01-10 NOTE — Evaluation (Signed)
Occupational Therapy Evaluation Patient Details Name: Brandi Hanson MRN: [id number] DOB: [birth year] Today's Date: 01/10/2020    History of Present Illness Brandi Hanson is a 84 y.o. female with medical history significant for Advanced dementia, hypertension, GERD, hospitalized from 4/2 - 12/16/19 with severe sepsis secondary to Klebsiella UTI, presenting with altered mental status, treated with ceftriaxone then Augmentin who presents to the emergency room with altered mental status.  Patient unable to give a history due to advanced dementia.   Clinical Impression   Ms Grunder was seen for OT evaluation this date. Pt is from [facility] memory care unit, oriented to self only and unable to provide PLOF. Pt presents to acute OT demonstrating impaired ADL performance, functional cognition,  and functional mobility 2/2 decreased safety awareness, poor command following, and functional strength deficits. Pt currently requires SETUP and Supervision for self-feeding seated EOB - MIN VCs to initiate and locate utensils on tray. SUP don/doff B socks seated EOB and at bed level long sitting. Anticipate MOD A + RW BSC t/f 2/2 poor balance in standing and limited command following. Pt would benefit from skilled OT to address noted impairments and functional limitations (see below for any additional details) in order to maximize safety and independence while minimizing falls risk and caregiver burden. Upon hospital discharge, recommend HHOT to maximize pt safety and return to functional independence during meaningful occupations of daily life.     Follow Up Recommendations  Home health OT;Supervision/Assistance - 24 hour    Equipment Recommendations  None recommended by OT    Recommendations for Other Services       Precautions / Restrictions Precautions Precautions: Fall Restrictions Weight Bearing Restrictions: No      Mobility Bed Mobility Overal bed mobility: Needs Assistance Bed  Mobility: Supine to Sit;Sit to Supine     Supine to sit: Mod assist Sit to supine: Mod assist   General bed mobility comments: MAX VCs for bed mobility   Transfers Overall transfer level: Needs assistance   Transfers: Sit to/from Stand Sit to Stand: Min assist         General transfer comment: MIN A sit<>stand at EOB     Balance Overall balance assessment: Needs assistance Sitting-balance support: No upper extremity supported;Feet supported Sitting balance-Leahy Scale: Good Sitting balance - Comments: SBA seated EOB during functional activity   Standing balance support: Bilateral upper extremity supported Standing balance-Leahy Scale: Poor                             ADL either performed or assessed with clinical judgement   ADL Overall ADL's : Needs assistance/impaired                                       General ADL Comments: SETUP and Supervision self-feeding seated EOB - MIN VCs to initiate and locate utensils. SUP don/doff B socks seated EOB and at bed level long sitting. SETUP grooming seated EOB. Anticipate MOD A + RW BSC t/f      Vision         Perception     Praxis      Pertinent Vitals/Pain Pain Assessment: No/denies pain     Hand Dominance Right   Extremity/Trunk Assessment Upper Extremity Assessment Upper Extremity Assessment: Generalized weakness   Lower Extremity Assessment Lower Extremity Assessment: Generalized weakness  Communication Communication Communication: Other (comment)(answers questions but is confused, sometimes appropriate)   Cognition Arousal/Alertness: Awake/alert Behavior During Therapy: Flat affect Overall Cognitive Status: No family/caregiver present to determine baseline cognitive functioning                                 General Comments: history of advanced dementia   General Comments       Exercises Exercises: Other exercises Other Exercises Other  Exercises: Pt educated re: t/f technique Other Exercises: Self-feeding/drinking, bed mobility, sup<>sit, sit<>stand x2, sitting/standing balance/tolerance,    Shoulder Instructions      Home Living Family/patient expects to be discharged to:: Assisted living                                 Additional Comments: patient lives in ALF. Unable to provide prior mobility or equipment      Prior Functioning/Environment Level of Independence: Needs assistance        Comments: Pt is from [facility] memory care unit, receives HHOT/PT.         OT Problem List: Decreased strength;Impaired balance (sitting and/or standing);Decreased cognition;Decreased safety awareness      OT Treatment/Interventions: Self-care/ADL training;Therapeutic exercise;Energy conservation;DME and/or AE instruction;Therapeutic activities;Cognitive remediation/compensation;Patient/family education;Balance training    OT Goals(Current goals can be found in the care plan section) Acute Rehab OT Goals Patient Stated Goal: not able to state OT Goal Formulation: Patient unable to participate in goal setting Time For Goal Achievement: 01/24/20 Potential to Achieve Goals: Fair ADL Goals Pt Will Perform Eating: with supervision;sitting Pt Will Perform Lower Body Dressing: with supervision;sitting/lateral leans Pt Will Transfer to Toilet: with min assist;stand pivot transfer;bedside commode(c LRAD PRN)  OT Frequency: Min 2X/week   Barriers to D/C:            Co-evaluation              AM-PAC OT "6 Clicks" Daily Activity     Outcome Measure Help from another person eating meals?: A Little Help from another person taking care of personal grooming?: A Little Help from another person toileting, which includes using toliet, bedpan, or urinal?: A Lot Help from another person bathing (including washing, rinsing, drying)?: A Little Help from another person to put on and taking off regular upper body  clothing?: A Little Help from another person to put on and taking off regular lower body clothing?: A Little 6 Click Score: 17   End of Session    Activity Tolerance: Patient tolerated treatment well Patient left: in bed;with call bell/phone within reach;with bed alarm set;Other (comment)(B mitts applied)  OT Visit Diagnosis: Unsteadiness on feet (R26.81);Other abnormalities of gait and mobility (R26.89)                Time: 1130-1155 OT Time Calculation (min): 25 min Charges:  OT General Charges $OT Visit: 1 Visit OT Evaluation $OT Eval Low Complexity: 1 Low OT Treatments $Self Care/Home Management : 23-37 mins  Dessie Coma, M.S. OTR/L  01/10/20, 1:06 PM

## 2020-01-10 NOTE — Progress Notes (Signed)
PROGRESS NOTE  AUTHERINE NITKA [id number] DOB: Jan 25, 1936 DOA: 01/07/2020 PCP: System, Pcp Not In  Brief History   84 year old woman PMH includes advanced dementia recently hospitalized with discharge 4/6 for severe sepsis secondary to Klebsiella UTI, who presented to the emergency room with altered mental status, was found to be febrile with leukocytosis and was admitted for sepsis secondary to UTI with toxic metabolic encephalopathy.  A & P  Sepsis secondary to Pseudomonas bacteremia, secondary to Pseudomonas UTI with associated toxic metabolic encephalopathy. --Continues to improve.  Continue cefepime today.  Likely transition to oral antibiotics tomorrow.  Acute thrombocytopenia.  Probably secondary to acute infection. --Stop enoxaparin. --CBC in a.m.  Essential hypertension --Appears stable  Dementia without behavioral disturbance --Appears stable.  Continue home Aricept, Ativan, Namenda, Risperdal, Zoloft  Disposition Plan:   Status is: Inpatient  Remains inpatient appropriate because:Ongoing diagnostic testing needed not appropriate for outpatient work up and IV treatments appropriate due to intensity of illness or inability to take PO   Dispo: The patient is from: SNF              Anticipated d/c is to: [name] care              Anticipated d/c date is: 5/2              Patient currently is not medically stable to d/c.  DVT prophylaxis: SCDs Code Status: DNR Family Communication: nephew Elta Guadeloupe by phone 5/1    Murray Hodgkins, MD  [facility] Direct contact: see [email or web address] (further directions at bottom of note if needed) 7PM-7AM contact night coverage as at bottom of note 01/10/2020, 1:30 PM  LOS: 3 days   Significant Hospital Events   .    Consults:  .    Procedures:  .   Significant Diagnostic Tests:  Marland Kitchen    Micro Data:  . Urine culture Pseudomonas . Blood cultures Pseudomonas   Antimicrobials:  . Cefepime 4/28 >  Interval  History/Subjective  Seems to be doing okay today.  History unreliable  Objective   Vitals:  Vitals:   01/09/20 1940 01/10/20 0423  BP: 119/75 134/90  Pulse: 83 81  Resp: 16 16  Temp: 98.1 F (36.7 C) (!) 97.5 F (36.4 C)  SpO2: 96% 99%    Exam:  Constitutional.  Appears calm, comfortable. Respiratory.  Clear to auscultation bilaterally.  No wheezes, rales or rhonchi.  Normal respiratory effort. Cardiovascular.  Regular rate and rhythm.  No murmur, rub or gallop.  No lower extremity edema. Abdomen.  Soft, nontender, nondistended. Psychiatric.  Grossly normal mood and affect.  Speech fluent and appropriate.  I have personally reviewed the following:   Today's Data  . No new data  Scheduled Meds: . cholecalciferol  2,000 Units Oral Daily  . donepezil  10 mg Oral QHS  . feeding supplement  1 Container Oral Daily  . loratadine  10 mg Oral Daily  . memantine  10 mg Oral BID  . risperiDONE  0.25 mg Oral QHS  . sertraline  150 mg Oral Daily   Continuous Infusions: . ceFEPime (MAXIPIME) IV Stopped (01/10/20 0720)  . lactated ringers 100 mL/hr at 01/10/20 1300    Principal Problem:   Sepsis secondary to UTI [facility]) Active Problems:   Essential (primary) hypertension   Dementia without behavioral disturbance (HCC)   Toxic metabolic encephalopathy   Bacteremia due to Gram-negative bacteria   LOS: 3 days   How to contact the [facility] Attending or  Consulting provider [place] or covering provider during after hours [place], for this patient?  1. Check the care team in [facility] and look for a) attending/consulting TRH provider listed and b) the [facility] team listed 2. Log into [email or web address] and use [place]'s universal password to access. If you do not have the password, please contact the hospital operator. 3. Locate the [facility] provider you are looking for under [facility] and page to a number that you can be directly reached. 4. If you still have difficulty reaching the provider,  please page the [facility] (Director on Call) for the Hospitalists listed on amion for assistance.

## 2020-01-10 NOTE — NC FL2 (Signed)
[place] LEVEL OF CARE SCREENING TOOL     IDENTIFICATION  Patient Name: Brandi Hanson Birthdate: 23-Apr-1936 Sex: female Admission Date (Current Location): 01/07/2020  [place] and [place] Number:  Engineering geologist and Address:  [facility], [address] [place], [place] [address]      Provider Number: [id number]  Attending Physician Name and Address:  Samuella Cota, MD  Relative Name and Phone Number:  Bethann Goo, [phone]    Current Level of Care: Hospital Recommended Level of Care: [place] Prior Approval Number:    Date Approved/Denied:   PASRR Number:    Discharge Plan: Domiciliary (Rest home)    Current Diagnoses: Patient Active Problem List   Diagnosis Date Noted  . Bacteremia due to Gram-negative bacteria 01/08/2020  . Severe sepsis ([place]) 12/12/2019  . Sepsis secondary to UTI ([place]) 12/12/2019  . Dementia without behavioral disturbance ([place]) 12/12/2019  . Toxic metabolic encephalopathy [id number]  . Near syncope 12/12/2019  . Aortic atherosclerosis ([place]) 01/03/2017  . Renal calculi 01/03/2017  . Allergic rhinitis 07/07/2015  . MCI (mild cognitive impairment) with memory loss 03/25/2015  . Depression, major, recurrent, mild ([place]) 03/25/2015  . Generalized anxiety disorder 03/25/2015  . IBS (irritable bowel syndrome) 02/15/2015  . At risk for falling 01/08/2015  . Body mass index (BMI) of 23.0-23.9 in adult 01/08/2015  . H/O malignant neoplasm of breast 01/08/2015  . Chronic constipation 01/08/2015  . Breast cancer of upper-outer quadrant of left female breast ([place]) 01/08/2015  . HZV (herpes zoster virus) post herpetic neuralgia 01/08/2015  . Apnea, sleep 01/08/2015  . Palpitations 08/21/2013  . Benign essential HTN 01/02/2012  . Beat, premature ventricular 04/21/2009  . Absolute anemia 02/05/2009  . Acid reflux 02/05/2009  . Essential (primary) hypertension 02/05/2009  .  Bone/cartilage disorder 07/22/2007  . Avitaminosis D 10/08/2006  . Carotid artery obstruction 09/11/1998    Orientation RESPIRATION BLADDER Height & Weight     (Disorientated x4)  Normal Incontinent, External catheter Weight: 54.3 kg Height:  5\' 6"  (167.6 cm)  BEHAVIORAL SYMPTOMS/MOOD NEUROLOGICAL BOWEL NUTRITION STATUS      Incontinent Diet(Dysphasia 3 diet, thin liquids)  AMBULATORY STATUS COMMUNICATION OF NEEDS Skin     Verbally Normal                       Personal Care Assistance Level of Assistance  Bathing, Feeding, Dressing Bathing Assistance: Maximum assistance Feeding assistance: Maximum assistance Dressing Assistance: Maximum assistance     Functional Limitations Info  Sight, Hearing, Speech Sight Info: Adequate Hearing Info: Adequate      SPECIAL CARE FACTORS FREQUENCY  PT (By licensed PT)     PT Frequency: minimum 2x a week              Contractures Contractures Info: Not present    Additional Factors Info  Code Status, Allergies, Psychotropic Code Status Info: DNR Allergies Info: Sulfa Antibiotics Psychotropic Info: sertraline (zoloft) 150mg  and Risperdol .25mg  at bedtime         Current Medications (01/10/2020):  This is the current hospital active medication list Current Facility-Administered Medications  Medication Dose Route Frequency Provider Last Rate Last Admin  . acetaminophen (TYLENOL) tablet 650 mg  650 mg Oral Q6H PRN Athena Masse, MD       Or  . acetaminophen (TYLENOL) suppository 650 mg  650 mg Rectal Q6H PRN Athena Masse, MD      .  ceFEPIme (MAXIPIME) 2 g in sodium chloride 0.9 % 100 mL IVPB  2 g Intravenous Q12H Samuella Cota, MD 200 mL/hr at 01/10/20 0650 2 g at 01/10/20 0650  . cholecalciferol (VITAMIN D3) tablet 2,000 Units  2,000 Units Oral Daily Samuella Cota, MD   2,000 Units at 01/10/20 0857  . donepezil (ARICEPT) tablet 10 mg  10 mg Oral QHS Samuella Cota, MD   10 mg at 01/09/20 2138  . enoxaparin  (LOVENOX) injection 40 mg  40 mg Subcutaneous Q24H Athena Masse, MD   40 mg at 01/09/20 2144  . feeding supplement (BOOST / RESOURCE BREEZE) liquid 1 Container  1 Container Oral Daily Samuella Cota, MD   1 Container at 01/10/20 [phone]  . lactated ringers infusion   Intravenous Continuous Athena Masse, MD 100 mL/hr at 01/10/20 0855 New Bag at 01/10/20 0855  . loratadine (CLARITIN) tablet 10 mg  10 mg Oral Daily Samuella Cota, MD   10 mg at 01/10/20 0857  . memantine (NAMENDA) tablet 10 mg  10 mg Oral BID Samuella Cota, MD   10 mg at 01/10/20 0857  . ondansetron (ZOFRAN) tablet 4 mg  4 mg Oral Q6H PRN Athena Masse, MD       Or  . ondansetron [facility]) injection 4 mg  4 mg Intravenous Q6H PRN Athena Masse, MD      . polyvinyl alcohol (LIQUIFILM TEARS) 1.4 % ophthalmic solution 1 drop  1 drop Both Eyes PRN Samuella Cota, MD      . risperiDONE (RISPERDAL) tablet 0.25 mg  0.25 mg Oral QHS Samuella Cota, MD   0.25 mg at 01/09/20 2138  . senna-docusate (Senokot-S) tablet 1 tablet  1 tablet Oral QHS PRN Athena Masse, MD   1 tablet at 01/08/20 1540  . sertraline (ZOLOFT) tablet 150 mg  150 mg Oral Daily Samuella Cota, MD   150 mg at 01/10/20 [id number]     Discharge Medications: Please see discharge summary for a list of discharge medications.  Relevant Imaging Results:  Relevant Lab Results:   Additional Information SSN: [id number]; COVID negative on 01/07/20  Rachit Grim B Aysen Shieh, LCSWA

## 2020-01-10 NOTE — Progress Notes (Signed)
Patient is very restless. She is constantly trying to get out of the bed. She is trying to pull her mitts off that are protecting her IV. Dr Sarajane Jews notified and order received for tele sitter and ativan.

## 2020-01-10 NOTE — Progress Notes (Signed)
Patient continues to be confused. Telesitter and mats in place. Low bed added. Patient presently sitting up in the recliner.

## 2020-01-11 ENCOUNTER — Inpatient Hospital Stay: Payer: Medicare PPO

## 2020-01-11 DIAGNOSIS — N138 Other obstructive and reflux uropathy: Secondary | ICD-10-CM

## 2020-01-11 DIAGNOSIS — N200 Calculus of kidney: Secondary | ICD-10-CM

## 2020-01-11 DIAGNOSIS — N1330 Unspecified hydronephrosis: Secondary | ICD-10-CM

## 2020-01-11 LAB — CBC
HCT: 32.2 % — ABNORMAL LOW (ref 36.0–46.0)
Hemoglobin: 10.7 g/dL — ABNORMAL LOW (ref 12.0–15.0)
MCH: 28.8 pg (ref 26.0–34.0)
MCHC: 33.2 g/dL (ref 30.0–36.0)
MCV: 86.8 fL (ref 80.0–100.0)
Platelets: 160 10*3/uL (ref 150–400)
RBC: 3.71 MIL/uL — ABNORMAL LOW (ref 3.87–5.11)
RDW: 13.9 % (ref 11.5–15.5)
WBC: 4.7 10*3/uL (ref 4.0–10.5)
nRBC: 0 % (ref 0.0–0.2)

## 2020-01-11 LAB — RESPIRATORY PANEL BY RT PCR (FLU A&B, COVID)
Influenza A by PCR: NEGATIVE
Influenza B by PCR: NEGATIVE
SARS Coronavirus 2 by RT PCR: NEGATIVE

## 2020-01-11 NOTE — Progress Notes (Signed)
Covid swab obtained. Q2 turned. Tele-sitter was discontinued this morning at 9 am for possibly SNF placement. Patient to be NPO after midnight.

## 2020-01-11 NOTE — Progress Notes (Signed)
PT Cancellation Note  Patient Details Name: Brandi Hanson MRN: [id number] DOB: 04/14/1936   Cancelled Treatment:    Reason Eval/Treat Not Completed: Patient at procedure or test/unavailable. Will re-attempt later if time allows.   Nahum Sherrer 01/11/2020, 10:28 AM

## 2020-01-11 NOTE — Progress Notes (Addendum)
PROGRESS NOTE  Brandi Hanson [id number] DOB: [birth year] DOA: 01/07/2020 PCP: System, Pcp Not In  Brief History   84 year old woman PMH includes advanced dementia recently hospitalized with discharge 4/6 for severe sepsis secondary to Klebsiella UTI, who presented to the emergency room with altered mental status, was found to be febrile with leukocytosis and was admitted for sepsis secondary to UTI with toxic metabolic encephalopathy.  A & P  Sepsis secondary to Pseudomonas bacteremia, secondary to Pseudomonas UTI with associated toxic metabolic encephalopathy. --Given recurrent bacteremia and UTI within the last 30 days, despite the fact patient was asymptomatic, given her dementia, history unreliable, therefore proceeded with imaging to rule out obstructing stone.  CT did reveal hydroureteronephrosis and obstructing stone.  LEFT hydronephrosis and hydroureter due to a 6 x 8 mm mid LEFT ureteral calculus. --Urology consultation placed.  Nonurgent.  Nonspecific rectal wall thickening --Asymptomatic.  Follow-up as an outpatient.  Acute thrombocytopenia.  Secondary to acute infection --Resolved  Essential hypertension --Appears stable  Dementia without behavioral disturbance --Stable.  Continue home Aricept, Ativan, Namenda, Risperdal, Zoloft  Disposition Plan:   Status is: Inpatient  Remains inpatient appropriate because:Ongoing diagnostic testing needed not appropriate for outpatient work up and IV treatments appropriate due to intensity of illness or inability to take PO  Dispo: The patient is from: SNF              Anticipated d/c is to: [name] care              Anticipated d/c date is: 5/3?              Patient currently is not medically stable to d/c.  DVT prophylaxis: SCDs Code Status: DNR Family Communication: nephew Elta Guadeloupe by telephone    Murray Hodgkins, MD  [facility] Direct contact: see [email or web address] (further directions at bottom of note if  needed) 7PM-7AM contact night coverage as at bottom of note 01/11/2020, 2:45 PM  LOS: 4 days   Significant Hospital Events   . 4/28 admitted for UTI   Consults:  Marland Kitchen Urology    Procedures:  .   Significant Diagnostic Tests:  CT abdomen pelvis showed LEFT hydronephrosis and hydroureter due to a 6 x 8 mm mid LEFT ureteral calculus.   Micro Data:  . Urine culture Pseudomonas . Blood cultures Pseudomonas   Antimicrobials:  . Cefepime 4/28 >  Interval History/Subjective  History unreliable but appears to feel well.  Objective   Vitals:  Vitals:   01/11/20 0458 01/11/20 1138  BP: (!) 142/77 (!) 145/79  Pulse: 72 73  Resp: 16   Temp: 97.9 F (36.6 C) 97.9 F (36.6 C)  SpO2: 97% 100%    Exam:  Constitutional.  Appears calm, comfortable. Cardiovascular.  Regular rate and rhythm.  No murmur, rub or gallop. Respiratory.  Clear to auscultation bilaterally.  No wheezes, rales or rhonchi.  Normal respiratory effort. Abdomen.  Soft, nontender, nondistended. Psychiatric.  Pleasant, confused, verbal responses and appropriate.  I have personally reviewed the following:   Today's Data  . CT showed left hydronephrosis and hydroureter due to 6 x 8 mm mid left ureteral calculus.  Nonspecific rectal wall thickening could reflect proctitis.  Scheduled Meds: . cholecalciferol  2,000 Units Oral Daily  . donepezil  10 mg Oral QHS  . feeding supplement  1 Container Oral Daily  . loratadine  10 mg Oral Daily  . memantine  10 mg Oral BID  . risperiDONE  0.25 mg Oral QHS  .  sertraline  150 mg Oral Daily   Continuous Infusions: . ceFEPime (MAXIPIME) IV 2 g (01/11/20 0653)  . lactated ringers 100 mL/hr at 01/11/20 [id number]    Principal Problem:   Sepsis secondary to UTI [facility]) Active Problems:   Essential (primary) hypertension   Dementia without behavioral disturbance (HCC)   Toxic metabolic encephalopathy   Bacteremia due to Gram-negative bacteria   LOS: 4 days   How to contact  the [facility] Attending or Consulting provider [place] or covering provider during after hours [place], for this patient?  1. Check the care team in [facility] and look for a) attending/consulting TRH provider listed and b) the [facility] team listed 2. Log into [email or web address] and use [place]'s universal password to access. If you do not have the password, please contact the hospital operator. 3. Locate the [facility] provider you are looking for under [facility] and page to a number that you can be directly reached. 4. If you still have difficulty reaching the provider, please page the [facility] (Director on Call) for the Hospitalists listed on amion for assistance.

## 2020-01-12 ENCOUNTER — Inpatient Hospital Stay: Payer: Medicare PPO | Admitting: Anesthesiology

## 2020-01-12 ENCOUNTER — Encounter
Admission: EM | Disposition: A | Discharge status: Home Health | Payer: Self-pay | Source: Skilled Nursing Facility | Attending: Family Medicine

## 2020-01-12 ENCOUNTER — Encounter: Payer: Self-pay | Admitting: Internal Medicine

## 2020-01-12 ENCOUNTER — Inpatient Hospital Stay: Payer: Medicare PPO

## 2020-01-12 DIAGNOSIS — A419 Sepsis, unspecified organism: Secondary | ICD-10-CM

## 2020-01-12 DIAGNOSIS — N201 Calculus of ureter: Secondary | ICD-10-CM

## 2020-01-12 DIAGNOSIS — N390 Urinary tract infection, site not specified: Secondary | ICD-10-CM

## 2020-01-12 DIAGNOSIS — N132 Hydronephrosis with renal and ureteral calculous obstruction: Secondary | ICD-10-CM

## 2020-01-12 HISTORY — PX: CYSTOSCOPY WITH STENT PLACEMENT: SHX5790

## 2020-01-12 LAB — BASIC METABOLIC PANEL
Anion gap: 8 (ref 5–15)
BUN: 12 mg/dL (ref 8–23)
CO2: 26 mmol/L (ref 22–32)
Calcium: 8.6 mg/dL — ABNORMAL LOW (ref 8.9–10.3)
Chloride: 106 mmol/L (ref 98–111)
Creatinine, Ser: 0.69 mg/dL (ref 0.44–1.00)
GFR calc Af Amer: >60 mL/min (ref >60)
GFR calc non Af Amer: >60 mL/min (ref >60)
Glucose, Bld: 93 mg/dL (ref 70–99)
Potassium: 3.3 mmol/L — ABNORMAL LOW (ref 3.5–5.1)
Sodium: 140 mmol/L (ref 135–145)

## 2020-01-12 LAB — CBC
HCT: 32.7 % — ABNORMAL LOW (ref 36.0–46.0)
Hemoglobin: 10.7 g/dL — ABNORMAL LOW (ref 12.0–15.0)
MCH: 28.5 pg (ref 26.0–34.0)
MCHC: 32.7 g/dL (ref 30.0–36.0)
MCV: 87 fL (ref 80.0–100.0)
Platelets: 168 10*3/uL (ref 150–400)
RBC: 3.76 MIL/uL — ABNORMAL LOW (ref 3.87–5.11)
RDW: 13.8 % (ref 11.5–15.5)
WBC: 5.7 10*3/uL (ref 4.0–10.5)
nRBC: 0 % (ref 0.0–0.2)

## 2020-01-12 SURGERY — CYSTOSCOPY, WITH STENT INSERTION
Anesthesia: General | Laterality: Left

## 2020-01-12 MED ORDER — FENTANYL CITRATE (PF) 100 MCG/2ML IJ SOLN: 25.0000 ug | INTRAMUSCULAR | Status: DC | PRN

## 2020-01-12 MED ORDER — PROPOFOL 10 MG/ML IV BOLUS
INTRAVENOUS | Status: DC | PRN
  Administered 2020-01-12: 120 mg via INTRAVENOUS

## 2020-01-12 MED ORDER — PROPOFOL 10 MG/ML IV BOLUS
INTRAVENOUS | Status: AC
  Filled 2020-01-12: qty 20

## 2020-01-12 MED ORDER — ONDANSETRON HCL 4 MG/2ML IJ SOLN: 4.0000 mg | Freq: Once | INTRAMUSCULAR | Status: DC | PRN

## 2020-01-12 MED ORDER — EPHEDRINE SULFATE 50 MG/ML IJ SOLN
INTRAMUSCULAR | Status: DC | PRN
  Administered 2020-01-12: 10 mg via INTRAVENOUS

## 2020-01-12 MED ORDER — POTASSIUM CHLORIDE 10 MEQ/100ML IV SOLN
10.0000 meq | INTRAVENOUS | Status: AC
  Administered 2020-01-12 (×4): 10 meq via INTRAVENOUS
  Filled 2020-01-12: qty 100

## 2020-01-12 MED ORDER — LIDOCAINE HCL (PF) 2 % IJ SOLN
INTRAMUSCULAR | Status: AC
  Filled 2020-01-12: qty 5

## 2020-01-12 MED ORDER — PHENYLEPHRINE HCL (PRESSORS) 10 MG/ML IV SOLN
INTRAVENOUS | Status: DC | PRN
  Administered 2020-01-12 (×2): 100 ug via INTRAVENOUS

## 2020-01-12 MED ORDER — LIDOCAINE HCL URETHRAL/MUCOSAL 2 % EX GEL
CUTANEOUS | Status: AC
  Filled 2020-01-12: qty 5

## 2020-01-12 MED ORDER — SODIUM CHLORIDE 0.9 % IV SOLN
INTRAVENOUS | Status: DC | PRN
  Administered 2020-01-12 (×2): 500 mL via INTRAVENOUS

## 2020-01-12 MED ORDER — IOPAMIDOL (ISOVUE-200) INJECTION 41%
INTRAVENOUS | Status: DC | PRN
  Administered 2020-01-12: 26 mL via INTRAVENOUS

## 2020-01-12 MED ORDER — FENTANYL CITRATE (PF) 100 MCG/2ML IJ SOLN
INTRAMUSCULAR | Status: DC | PRN
  Administered 2020-01-12: 25 ug via INTRAVENOUS

## 2020-01-12 MED ORDER — FENTANYL CITRATE (PF) 100 MCG/2ML IJ SOLN
INTRAMUSCULAR | Status: AC
  Filled 2020-01-12: qty 2

## 2020-01-12 MED ORDER — LIDOCAINE HCL (CARDIAC) PF 100 MG/5ML IV SOSY
PREFILLED_SYRINGE | INTRAVENOUS | Status: DC | PRN
  Administered 2020-01-12: 60 mg via INTRAVENOUS

## 2020-01-12 SURGICAL SUPPLY — 22 items
BAG DRAIN CYSTO-URO LG1000N (MISCELLANEOUS) ×1 IMPLANT
BRUSH SCRUB EZ 1% IODOPHOR (MISCELLANEOUS) ×3 IMPLANT
CATH URETL 5X70 OPEN END (CATHETERS) IMPLANT
CNTNR SPEC 2.5X3XGRAD LEK (MISCELLANEOUS) ×1
CONT SPEC 4OZ STER OR WHT (MISCELLANEOUS) ×2
CONT SPEC 4OZ STRL OR WHT (MISCELLANEOUS) ×1
CONTAINER SPEC 2.5X3XGRAD LEK (MISCELLANEOUS) IMPLANT
DRAPE UNDER BUTTOCK W/FLU (DRAPES) ×2 IMPLANT
GLOVE BIO SURGEON STRL SZ8 (GLOVE) ×3 IMPLANT
GOWN STRL REUS W/ TWL XL LVL3 (GOWN DISPOSABLE) ×1 IMPLANT
GOWN STRL REUS W/TWL XL LVL3 (GOWN DISPOSABLE) ×3
GUIDEWIRE STR DUAL SENSOR (WIRE) ×3 IMPLANT
KIT TURNOVER CYSTO (KITS) ×3 IMPLANT
PACK CYSTO AR (MISCELLANEOUS) ×3 IMPLANT
SET CYSTO W/LG BORE CLAMP LF (SET/KITS/TRAYS/PACK) ×3 IMPLANT
SOL .9 NS 3000ML IRR  AL (IV SOLUTION) ×3
SOL .9 NS 3000ML IRR AL (IV SOLUTION) ×1
SOL .9 NS 3000ML IRR UROMATIC (IV SOLUTION) ×1 IMPLANT
STENT URET 6FRX24 CONTOUR (STENTS) ×2 IMPLANT
STENT URET 6FRX26 CONTOUR (STENTS) IMPLANT
SURGILUBE 2OZ TUBE FLIPTOP (MISCELLANEOUS) ×3 IMPLANT
WATER STERILE IRR 1000ML POUR (IV SOLUTION) ×3 IMPLANT

## 2020-01-12 NOTE — H&P (View-Only) (Signed)
Urology Consult  Requesting physician: Dr. Sarajane Jews  Reason for consultation: Left ureteral stone with hydronephrosis  Chief Complaint: N/A  History of Present Illness: Brandi DILLAHUNT is a 84 y.o. female with advanced dementia who was discharged 4/6 for sepsis secondary to a Klebsiella UTI.  She was readmitted 01/07/2020 after being seen in the ED for evaluation of altered mental status, fever, leukocytosis and toxic metabolic encephalopathy.  She had Pseudomonas bacteremia secondary to Pseudomonas UTI.  She has responded clinically and discharge was anticipated today however a CT of the abdomen and pelvis without contrast was performed with her history of recurrent sepsis and she was found to have a left proximal ureteral calculus with hydronephrosis.  She has no complaints.  Past Medical History:  Diagnosis Date  . Abdominal distention   . Abdominal pain   . Anxiety   . Arthritis   . Bacteremia due to Gram-negative bacteria 01/08/2020  . Benign essential HTN 01/02/2012  . Breast cancer in situ 07/22/2011  . Cancer ([place])    skin  . Confusion   . Depression   . Generalized headaches   . GERD (gastroesophageal reflux disease)   . Hyperlipidemia   . Hyperlipidemia 01/02/2012  . Hypertension   . Memory loss   . Osteoporosis   . Palpitations   . PVC (premature ventricular contraction)   . Rectal pain   . Sleep apnea     Past Surgical History:  Procedure Laterality Date  . ABDOMINAL HYSTERECTOMY  1978  . [place]   left  . breast cancer  10/2010   ductal carcinoma in situ, left breast  . CATARACT EXTRACTION  2011   bilateral  . FOOT SURGERY  2005  . KIDNEY STONE SURGERY  2011  . ROTATOR CUFF REPAIR  2007    Home Medications:  Current Meds  Medication Sig  . acetaminophen (TYLENOL) 325 MG tablet Take 650 mg by mouth in the morning and at bedtime.   Marland Kitchen acetaminophen (TYLENOL) 500 MG tablet Take 500 mg by mouth every 4 (four) hours as needed for mild  pain or fever.  . Calcium Carb-Cholecalciferol (CALCIUM 600+D) 600-800 MG-UNIT TABS Take 1 tablet by mouth 2 (two) times daily.   . cholecalciferol (VITAMIN D) 25 MCG tablet Take 2 tablets (2,000 Units total) by mouth daily.  Marland Kitchen donepezil (ARICEPT) 10 MG tablet TAKE 1 TABLET BY MOUTH AT BEDTIME (Patient taking differently: Take 10 mg by mouth at bedtime. )  . fluticasone (FLONASE) 50 MCG/ACT nasal spray Place 1 spray into both nostrils daily.   Marland Kitchen loratadine (CLARITIN) 10 MG tablet Take 10 mg by mouth daily.  Marland Kitchen LORazepam (ATIVAN) 0.5 MG tablet Take 0.5 tablets (0.25 mg total) by mouth 2 (two) times daily as needed for anxiety or sleep.  . melatonin 3 MG TABS tablet Take 3 mg by mouth at bedtime.  . memantine (NAMENDA) 10 MG tablet Take 10 mg by mouth 2 (two) times daily.  . ondansetron (ZOFRAN) 8 MG tablet Take 8 mg by mouth every 8 (eight) hours as needed for nausea or vomiting.  . polyethylene glycol (MIRALAX / GLYCOLAX) 17 g packet Take 17 g by mouth daily.  . propranolol (INDERAL) 20 MG tablet Take 1 tablet (20 mg total) by mouth 2 (two) times daily.  Marland Kitchen Propylene Glycol (SYSTANE COMPLETE OP) Place 1 drop into both eyes in the morning and at bedtime.   . risperiDONE (RISPERDAL) 0.25 MG tablet Take 0.25 mg by  mouth at bedtime.  . sertraline (ZOLOFT) 100 MG tablet Take 150 mg by mouth daily.  . vitamin B-12 1000 MCG tablet Take 1 tablet (1,000 mcg total) by mouth daily.    Allergies:  Allergies  Allergen Reactions  . Sulfa Antibiotics Nausea Only    Family History  Problem Relation Age of Onset  . Cancer Mother        breast  . Crohn's disease Mother   . Cancer Father        lung  . Hypertension Father   . Cancer Sister        breast and uterine  . Hypertension Sister   . Heart disease Maternal Grandmother   . Cancer Maternal Grandfather        colon  . Hypertension Maternal Grandfather   . Heart disease Paternal Grandmother   . Heart disease Paternal Grandfather     Social  History:  reports that she has never smoked. She has never used smokeless tobacco. She reports that she does not drink alcohol or use drugs.  Obtained via chart review  ROS: Unable to obtain secondary to dementia  Physical Exam:  Vital signs in last 24 hours: Temp:  [97.6 F (36.4 C)-98.4 F (36.9 C)] 97.6 F (36.4 C) (05/03 0442) Pulse Rate:  [72-82] 82 (05/03 0442) Resp:  [14-17] 14 (05/03 0442) BP: (142-156)/(79-85) 156/85 (05/03 0442) SpO2:  [97 %-100 %] 97 % (05/03 0442) Constitutional:  Alert, No acute distress HEENT: [place] AT, moist mucus membranes.  Trachea midline, no masses Cardiovascular: Regular rate and rhythm, no clubbing, cyanosis, or edema. Respiratory: Normal respiratory effort, lungs clear bilaterally GI: Abdomen is soft, nontender, nondistended, no abdominal masses GU: No CVA tenderness Skin: No rashes, bruises or suspicious lesions Neurologic: Grossly intact, no focal deficits, moving all 4 extremities   Laboratory Data:  Recent Labs    01/11/20 0616 01/12/20 0753  WBC 4.7 5.7  HGB 10.7* 10.7*  HCT 32.2* 32.7*   No results for input(s): NA, K, CL, CO2, GLUCOSE, BUN, CREATININE, CALCIUM in the last 72 hours. No results for input(s): LABPT, INR in the last 72 hours. No results for input(s): LABURIN in the last 72 hours. Results for orders placed or performed during the hospital encounter of 01/07/20  Blood Culture (routine x 2)     Status: Abnormal   Collection Time: 01/07/20  6:48 PM   Specimen: Left Antecubital; Blood  Result Value Ref Range Status   Specimen Description   Final    LEFT ANTECUBITAL Performed at [facility], [address] [place], [place] [address]    Special Requests   Final    BOTTLES DRAWN AEROBIC AND ANAEROBIC Blood Culture adequate volume Performed at [facility], [address], [place], [place] [address]    Culture  Setup Time   Final    GRAM NEGATIVE RODS AEROBIC BOTTLE ONLY CRITICAL VALUE NOTED.   VALUE IS CONSISTENT WITH PREVIOUSLY REPORTED AND CALLED VALUE. Performed at [facility], [address] [place], [place] [address]    Culture (A)  Final    PSEUDOMONAS AERUGINOSA SUSCEPTIBILITIES PERFORMED ON PREVIOUS CULTURE WITHIN THE LAST 5 DAYS. Performed at [facility], [place] [address] [place], [place] [address]    Report Status 01/10/2020 FINAL  Final  Urine culture     Status: Abnormal   Collection Time: 01/07/20  6:48 PM   Specimen: In/Out Cath Urine  Result Value Ref Range Status   Specimen Description  Final    IN/OUT CATH URINE Performed at [facility], [address] [place], [place] [address]    Special Requests   Final    NONE Performed at [facility], [address] [place], [place] [address]    Culture >=100,000 COLONIES/mL PSEUDOMONAS AERUGINOSA (A)  Final   Report Status 01/09/2020 FINAL  Final   Organism ID, Bacteria PSEUDOMONAS AERUGINOSA (A)  Final      Susceptibility   Pseudomonas aeruginosa - MIC*    CEFTAZIDIME <=1 SENSITIVE Sensitive     CIPROFLOXACIN <=0.25 SENSITIVE Sensitive     GENTAMICIN <=1 SENSITIVE Sensitive     IMIPENEM 1 SENSITIVE Sensitive     PIP/TAZO <=4 SENSITIVE Sensitive     CEFEPIME <=1 SENSITIVE Sensitive     * >=100,000 COLONIES/mL PSEUDOMONAS AERUGINOSA  Blood Culture (routine x 2)     Status: Abnormal   Collection Time: 01/07/20  6:53 PM   Specimen: BLOOD RIGHT HAND  Result Value Ref Range Status   Specimen Description   Final    BLOOD RIGHT HAND Performed at [facility], [address] [place], [place] [address]    Special Requests   Final    BOTTLES DRAWN AEROBIC AND ANAEROBIC Blood Culture adequate volume Performed at [facility], [address] [place], [name] [address]    Culture  Setup Time   Final    GRAM NEGATIVE RODS IN BOTH AEROBIC AND ANAEROBIC BOTTLES CRITICAL RESULT CALLED TO, READ BACK BY AND VERIFIED WITH: JASON ROBBINS AT [id number] ON 01/08/20  SNG Performed at [facility], [place] [address], [place], [place] [address]    Culture PSEUDOMONAS AERUGINOSA (A)  Final   Report Status 01/10/2020 FINAL  Final   Organism ID, Bacteria PSEUDOMONAS AERUGINOSA  Final      Susceptibility   Pseudomonas aeruginosa - MIC*    CEFTAZIDIME <=1 SENSITIVE Sensitive     CIPROFLOXACIN <=0.25 SENSITIVE Sensitive     GENTAMICIN <=1 SENSITIVE Sensitive     IMIPENEM 1 SENSITIVE Sensitive     PIP/TAZO <=4 SENSITIVE Sensitive     CEFEPIME <=1 SENSITIVE Sensitive     * PSEUDOMONAS AERUGINOSA  Blood Culture ID Panel (Reflexed)     Status: Abnormal   Collection Time: 01/07/20  6:53 PM  Result Value Ref Range Status   Enterococcus species NOT DETECTED NOT DETECTED Final   Listeria monocytogenes NOT DETECTED NOT DETECTED Final   Staphylococcus species NOT DETECTED NOT DETECTED Final   Staphylococcus aureus (BCID) NOT DETECTED NOT DETECTED Final   Streptococcus species NOT DETECTED NOT DETECTED Final   Streptococcus agalactiae NOT DETECTED NOT DETECTED Final   Streptococcus pneumoniae NOT DETECTED NOT DETECTED Final   Streptococcus pyogenes NOT DETECTED NOT DETECTED Final   Acinetobacter baumannii NOT DETECTED NOT DETECTED Final   Enterobacteriaceae species NOT DETECTED NOT DETECTED Final   Enterobacter cloacae complex NOT DETECTED NOT DETECTED Final   Escherichia coli NOT DETECTED NOT DETECTED Final   Klebsiella oxytoca NOT DETECTED NOT DETECTED Final   Klebsiella pneumoniae NOT DETECTED NOT DETECTED Final   Proteus species NOT DETECTED NOT DETECTED Final   Serratia marcescens NOT DETECTED NOT DETECTED Final   Carbapenem resistance NOT DETECTED NOT DETECTED Final   Haemophilus influenzae NOT DETECTED NOT DETECTED Final   Neisseria meningitidis NOT DETECTED NOT DETECTED Final   Pseudomonas aeruginosa DETECTED (A) NOT DETECTED Final    Comment: CRITICAL RESULT CALLED TO, READ BACK BY AND VERIFIED WITH: JASON ROBBINS AT 1624  ON 01/08/20 SNG     Candida albicans NOT DETECTED NOT DETECTED Final   Candida glabrata NOT DETECTED NOT DETECTED Final   Candida krusei NOT DETECTED NOT DETECTED Final   Candida parapsilosis NOT DETECTED NOT DETECTED Final   Candida tropicalis NOT DETECTED NOT DETECTED Final    Comment: Performed at [facility], [address] [place], [place] [address]  Respiratory Panel by RT PCR (Flu A&B, Covid) - Nasopharyngeal Swab     Status: None   Collection Time: 01/07/20  6:54 PM   Specimen: Nasopharyngeal Swab  Result Value Ref Range Status   SARS Coronavirus 2 by RT PCR NEGATIVE NEGATIVE Final    Comment: (NOTE) SARS-CoV-2 target nucleic acids are NOT DETECTED. The SARS-CoV-2 RNA is generally detectable in upper respiratoy specimens during the acute phase of infection. The lowest concentration of SARS-CoV-2 viral copies this assay can detect is 131 copies/mL. A negative result does not preclude SARS-Cov-2 infection and should not be used as the sole basis for treatment or other patient management decisions. A negative result may occur with  improper specimen collection/handling, submission of specimen other than nasopharyngeal swab, presence of viral mutation(s) within the areas targeted by this assay, and inadequate number of viral copies (<131 copies/mL). A negative result must be combined with clinical observations, patient history, and epidemiological information. The expected result is Negative. Fact Sheet for Patients:  PinkCheek.be Fact Sheet for Healthcare Providers:  GravelBags.it This test is not yet ap proved or cleared by the [place] FDA and  has been authorized for detection and/or diagnosis of SARS-CoV-2 by FDA under an Emergency Use Authorization (EUA). This EUA will remain  in effect (meaning this test can be used) for the duration of the COVID-19 declaration under Section 564(b)(1) of the Act, 21 U.S.C. section  360bbb-3(b)(1), unless the authorization is terminated or revoked sooner.    Influenza A by PCR NEGATIVE NEGATIVE Final   Influenza B by PCR NEGATIVE NEGATIVE Final    Comment: (NOTE) The Xpert Xpress SARS-CoV-2/FLU/RSV assay is intended as an aid in  the diagnosis of influenza from Nasopharyngeal swab specimens and  should not be used as a sole basis for treatment. Nasal washings and  aspirates are unacceptable for Xpert Xpress SARS-CoV-2/FLU/RSV  testing. Fact Sheet for Patients: PinkCheek.be Fact Sheet for Healthcare Providers: GravelBags.it This test is not yet approved or cleared by the [place] FDA and  has been authorized for detection and/or diagnosis of SARS-CoV-2 by  FDA under an Emergency Use Authorization (EUA). This EUA will remain  in effect (meaning this test can be used) for the duration of the  Covid-19 declaration under Section 564(b)(1) of the Act, 21  U.S.C. section 360bbb-3(b)(1), unless the authorization is  terminated or revoked. Performed at [facility], [address] [place], [place] [address]   MRSA PCR Screening     Status: None   Collection Time: 01/08/20  5:06 AM   Specimen: Nasopharyngeal  Result Value Ref Range Status   MRSA by PCR NEGATIVE NEGATIVE Final    Comment:        The GeneXpert MRSA Assay (FDA approved for NASAL specimens only), is one component of a comprehensive MRSA colonization surveillance program. It is not intended to diagnose MRSA infection nor to guide or monitor treatment for MRSA infections. Performed at [facility], [address] [place],  [address]   Respiratory Panel by RT PCR (Flu A&B, Covid) - Nasopharyngeal Swab     Status:  None   Collection Time: 01/11/20  3:08 PM   Specimen: Nasopharyngeal Swab  Result Value Ref Range Status   SARS Coronavirus 2 by RT PCR NEGATIVE NEGATIVE Final    Comment: (NOTE) SARS-CoV-2 target  nucleic acids are NOT DETECTED. The SARS-CoV-2 RNA is generally detectable in upper respiratoy specimens during the acute phase of infection. The lowest concentration of SARS-CoV-2 viral copies this assay can detect is 131 copies/mL. A negative result does not preclude SARS-Cov-2 infection and should not be used as the sole basis for treatment or other patient management decisions. A negative result may occur with  improper specimen collection/handling, submission of specimen other than nasopharyngeal swab, presence of viral mutation(s) within the areas targeted by this assay, and inadequate number of viral copies (<131 copies/mL). A negative result must be combined with clinical observations, patient history, and epidemiological information. The expected result is Negative. Fact Sheet for Patients:  PinkCheek.be Fact Sheet for Healthcare Providers:  GravelBags.it This test is not yet ap proved or cleared by the [place] FDA and  has been authorized for detection and/or diagnosis of SARS-CoV-2 by FDA under an Emergency Use Authorization (EUA). This EUA will remain  in effect (meaning this test can be used) for the duration of the COVID-19 declaration under Section 564(b)(1) of the Act, 21 U.S.C. section 360bbb-3(b)(1), unless the authorization is terminated or revoked sooner.    Influenza A by PCR NEGATIVE NEGATIVE Final   Influenza B by PCR NEGATIVE NEGATIVE Final    Comment: (NOTE) The Xpert Xpress SARS-CoV-2/FLU/RSV assay is intended as an aid in  the diagnosis of influenza from Nasopharyngeal swab specimens and  should not be used as a sole basis for treatment. Nasal washings and  aspirates are unacceptable for Xpert Xpress SARS-CoV-2/FLU/RSV  testing. Fact Sheet for Patients: PinkCheek.be Fact Sheet for Healthcare Providers: GravelBags.it This test is not  yet approved or cleared by the [place] FDA and  has been authorized for detection and/or diagnosis of SARS-CoV-2 by  FDA under an Emergency Use Authorization (EUA). This EUA will remain  in effect (meaning this test can be used) for the duration of the  Covid-19 declaration under Section 564(b)(1) of the Act, 21  U.S.C. section 360bbb-3(b)(1), unless the authorization is  terminated or revoked. Performed at [facility], [address] [place], [place] [address]      Radiologic Imaging: CT was personally reviewed.  On my review stone dimensions are 8 x 15 mm.  CT RENAL STONE STUDY  Result Date: 01/11/2020 CLINICAL DATA:  Sepsis due to recurrent UTI, leukocytosis, febrile, bacteremia due to Klebsiella, toxic metabolic encephalopathy EXAM: CT ABDOMEN AND PELVIS WITHOUT CONTRAST TECHNIQUE: Multidetector CT imaging of the abdomen and pelvis was performed following the standard protocol without IV contrast. Sagittal and coronal MPR images reconstructed from axial data set. Oral contrast not administered. COMPARISON:  01/10/2017 FINDINGS: Lower chest: Small BILATERAL pleural effusions and lower lobe atelectasis greater on LEFT. Low-attenuation of circulating blood at cardiac chambers. Mitral annular calcification. Hepatobiliary: Gallbladder and liver unremarkable Pancreas: Normal appearance Spleen: Normal appearance Adrenals/Urinary Tract: Adrenal glands normal appearance. BILATERAL renal calculi. LEFT hydronephrosis and proximal hydroureter due to a 6 x 8 mm LEFT ureteral calculus image 47. No RIGHT hydronephrosis or hydroureter. Bladder unremarkable. Stomach/Bowel: Sigmoid diverticulosis without evidence of diverticulitis. Mild rectosigmoid wall thickening with edema in the perirectal and presacral fat, nonspecific, proctitis not excluded. Appendix not definitely visualized. Stomach and remaining bowel loops otherwise normal appearance. Vascular/Lymphatic: Atherosclerotic calcifications  aorta and iliac arteries without aneurysm. No adenopathy. Reproductive: Uterus surgically absent.  Ovaries not visualized. Other: Tiny LEFT inguinal hernia containing fat. No free air or free fluid. Musculoskeletal: Bones demineralized. Scattered degenerative changes lumbar spine. IMPRESSION: LEFT hydronephrosis and hydroureter due to a 6 x 8 mm mid LEFT ureteral calculus. Additional BILATERAL nonobstructing renal calculi. Sigmoid diverticulosis. Rectal wall thickening with edema in perirectal and presacral fat, nonspecific but could reflect proctitis recommend correlation with proctoscopy. Tiny LEFT inguinal hernia containing fat. Small BILATERAL pleural effusions and bibasilar atelectasis. Question anemia. Electronically Signed   By: Lavonia Dana M.D.   On: 01/11/2020 12:04    Impression/Assessment:  Obstructing left lower proximal/mid ureteral calculus with hydronephrosis.  Most likely chronic and a potential source of her recurrent sepsis  Recommendations:  -Cystoscopy with placement left ureteral stent -I discussed the procedure with her nephew Gordy Clement.  The indications, nature of the procedure and potential risks were discussed including worsening sepsis, ureteral injury and inability to place a stent due to an impacted stone.  In this event she would need a percutaneous nephrostomy.  He indicated all questions were answered and will consent for the procedure. -I was stressed no attempts will be made for stone removal due to sepsis and she will need treatment at a later date.  01/12/2020, 8:43 AM  John Giovanni,  MD

## 2020-01-12 NOTE — Progress Notes (Signed)
Physical Therapy Treatment Patient Details Name: Brandi Hanson MRN: [id number] DOB: 14-Jun-1936 Today's Date: 01/12/2020    History of Present Illness Brandi Hanson is a 84 y.o. female with medical history significant for Advanced dementia, hypertension, GERD, hospitalized from 4/2 - 12/16/19 with severe sepsis secondary to Klebsiella UTI, presenting with altered mental status, treated with ceftriaxone then Augmentin who presents to the emergency room with altered mental status.  Patient unable to give a history due to advanced dementia.    PT Comments    Pt awake and ready for session.  To EOB with mod a x 1 to initiate transition and min a x 1 once started.  She is able to sit EOB with supervision and no LOB but occasional cues to sit upright.  Stood to [employer] with mod a x 1.  Post/right lean and unable to safely step with +1 assist.  Walker removed and she is able to transfer to recliner at bedside.  Remained up with alarm on, mats in place and mitts on hands.    Discussed with SWS regarding discharge disposition.  Pt from memory care at ALF.  If facility can provide mod a x 1 for mobility skills, discharge back to facility is appropriate with continued HHPT in place if not at her baseline.  If level of care is not available, SNF would be appropiate.   Follow Up Recommendations  SNF;Other (comment)  See above.     Equipment Recommendations  None recommended by PT    Recommendations for Other Services       Precautions / Restrictions Precautions Precautions: Fall Restrictions Weight Bearing Restrictions: No    Mobility  Bed Mobility Overal bed mobility: Needs Assistance Bed Mobility: Supine to Sit     Supine to sit: Mod assist;Min assist        Transfers Overall transfer level: Needs assistance Equipment used: Rolling walker (2 wheeled);None Transfers: Sit to/from [facility] Sit to Stand: Mod assist Stand pivot transfers: Mod assist       General  transfer comment: stood at EOB with RW.  Unable to step.  RW removed and stnd pivot to recliner at bedside with mod a x 1.  Ambulation/Gait             General Gait Details: unable   Stairs             Wheelchair Mobility    Modified Rankin (Stroke Patients Only)       Balance Overall balance assessment: Needs assistance Sitting-balance support: No upper extremity supported;Feet supported Sitting balance-Leahy Scale: Good Sitting balance - Comments: supervision for safey and cues to correct balance but no physical assist needed.   Standing balance support: Bilateral upper extremity supported Standing balance-Leahy Scale: Poor                              Cognition Arousal/Alertness: Awake/alert Behavior During Therapy: WFL for tasks assessed/performed Overall Cognitive Status: No family/caregiver present to determine baseline cognitive functioning                                 General Comments: history of advanced dementia      Exercises Other Exercises Other Exercises: LAQ in sitting.    General Comments        Pertinent Vitals/Pain Pain Assessment: No/denies pain    Home Living  Prior Function            PT Goals (current goals can now be found in the care plan section) Progress towards PT goals: Progressing toward goals    Frequency    Min 2X/week      PT Plan Current plan remains appropriate    Co-evaluation              AM-PAC PT "6 Clicks" Mobility   Outcome Measure  Help needed turning from your back to your side while in a flat bed without using bedrails?: A Lot Help needed moving from lying on your back to sitting on the side of a flat bed without using bedrails?: A Lot Help needed moving to and from a bed to a chair (including a wheelchair)?: A Lot Help needed standing up from a chair using your arms (e.g., wheelchair or bedside chair)?: A Lot Help needed to  walk in hospital room?: Total Help needed climbing 3-5 steps with a railing? : Total 6 Click Score: 10    End of Session Equipment Utilized During Treatment: Gait belt Activity Tolerance: Patient tolerated treatment well Patient left: in chair;with call bell/phone within reach;with chair alarm set Nurse Communication: Mobility status       Time: [id number] PT Time Calculation (min) (ACUTE ONLY): 14 min  Charges:  $Therapeutic Activity: 8-22 mins                    Chesley Noon, PTA 01/12/20, 9:36 AM

## 2020-01-12 NOTE — Progress Notes (Signed)
Underwent procedure with Dr. Bernardo Heater today. Potassium runs provided for potassium level of 3.3 per MD orders. Family at bedside today. PT has worked with the patient today.  Q2 turns performed. Low bed and air mattress in place.

## 2020-01-12 NOTE — Clinical Social Work Note (Signed)
Patient suffers from sepsis secondary to UTI, dementia, toxic metabolic encephalopathy, and bacteremia which impairs her ability to perform daily activities like meal preparation, toileting, and home making in the home. A walker will not resolve issue with performing activities of daily living. A wheelchair will allow patient to safely perform daily activities. Patient is not able to propel themselves in the home using a standard weight wheelchair due to weakness. Patient can self propel in the lightweight wheelchair. Length of need: 99 months. Accessories: elevating leg rests (ELRs), wheel locks, extensions and anti-tippers, and back cushion.  [place] Scrape, [place]

## 2020-01-12 NOTE — Interval H&P Note (Signed)
History and Physical Interval Note:  01/12/2020 12:11 PM  Brandi Hanson  has presented today for surgery, with the diagnosis of Left Ureteral Stone.  The various methods of treatment have been discussed with the patient and family. After consideration of risks, benefits and other options for treatment, the patient has consented to  Procedure(s): [place] (Left) as a surgical intervention.  The patient's history has been reviewed, patient examined, no change in status, stable for surgery.  I have reviewed the patient's chart and labs.  Questions were answered to the patient's satisfaction.     [employer]

## 2020-01-12 NOTE — TOC Progression Note (Addendum)
Transition of Care [facility]) - Progression Note    Patient Details  Name: Brandi Hanson MRN: [id number] Date of Birth: Aug 11, 1936  Transition of Care [facility]) CM/SW [place], LCSW Phone Number: 01/12/2020, 10:07 AM  Clinical Narrative: CSW spoke to [place], care coordinator for memory care unit at [facility]. Gave overview of how patient has improved with therapy. She said they should be able to take her back at discharge. Faxed over most recent PT and OT notes for her to review and asked her to call CSW with confirmation that they can meet her needs.    11:25 am: Ellison Hughs has reviewed the therapy notes and stated that patient can return when discharged. Let her know that PT thinks she would benefit from a wheelchair, at least initially. Patient does not have one so will have to order.  1:01 pm: Updated nephew. He is agreeable to return to [facility] and for CSW to order her wheelchair. Sent MD a secure chat asking him to put in DME order for lightweight wheelchair and cosign progress note that insurance will require. Gave referral to [facility] at [facility] and let her know that ALF would prefer to have it delivered to their facility.  2:15 pm: Per MD, potential discharge later today (5:00 or so at the latest) if stable. Ellison Hughs at [facility] is aware and agreeable. She asked that discharge summary be entered early so any new medication can be delivered tonight. MD aware. Patient will need EMS back to the facility. Left voicemail for Juliann Pulse with [employer] to let her know that all required documentation for wheelchair is in.  2:31 pm: Per MD, plan for discharge tomorrow. Ellison Hughs is aware. Left message for [facility] representative to notify.  Expected Discharge Plan: Assisted Living(Memory Care with home health.) Barriers to Discharge: Continued Medical Work up  Expected Discharge Plan and Services Expected Discharge Plan: Assisted Living(Memory Care with home health.)       Living  arrangements for the past 2 months: Assisted Living Facility                                       Social Determinants of Health (SDOH) Interventions    Readmission Risk Interventions No flowsheet data found.

## 2020-01-12 NOTE — Anesthesia Preprocedure Evaluation (Signed)
Anesthesia Evaluation  Patient identified by MRN, date of birth, ID band Patient confused    Reviewed: Patient's Chart, lab work & pertinent test results, Unable to perform ROS - Chart review only  History of Anesthesia Complications Negative for: history of anesthetic complications  Airway Mallampati: II       Dental   Pulmonary sleep apnea , neg COPD,           Cardiovascular hypertension, Pt. on medications (-) Past MI and (-) CHF (-) dysrhythmias (-) Valvular Problems/Murmurs     Neuro/Psych neg Seizures Anxiety Depression Dementia    GI/Hepatic Neg liver ROS, GERD  Medicated and Controlled,  Endo/Other  neg diabetes  Renal/GU Renal disease (stones, sepsis)     Musculoskeletal   Abdominal   Peds  Hematology   Anesthesia Other Findings   Reproductive/Obstetrics                             Anesthesia Physical Anesthesia Plan  ASA: III  Anesthesia Plan: General   Post-op Pain Management:    Induction: Intravenous  PONV Risk Score and Plan: 3 and Ondansetron and Treatment may vary due to age or medical condition  Airway Management Planned: LMA  Additional Equipment:   Intra-op Plan:   Post-operative Plan:   Informed Consent: I have reviewed the patients History and Physical, chart, labs and discussed the procedure including the risks, benefits and alternatives for the proposed anesthesia with the patient or authorized representative who has indicated his/her understanding and acceptance.       Plan Discussed with:   Anesthesia Plan Comments:         Anesthesia Quick Evaluation

## 2020-01-12 NOTE — Progress Notes (Signed)
PROGRESS NOTE  Brandi Hanson [id number] DOB: 1935-12-28 DOA: 01/07/2020 PCP: System, Pcp Not In  Brief History   84 year old woman PMH includes advanced dementia recently hospitalized with discharge 4/6 for severe sepsis secondary to Klebsiella UTI, who presented to the emergency room with altered mental status, was found to be febrile with leukocytosis and was admitted for sepsis secondary to UTI with toxic metabolic encephalopathy.  A & P  Sepsis secondary to Pseudomonas bacteremia, secondary to Pseudomonas UTI with associated toxic metabolic encephalopathy; complicated by hydroureteronephrosis and obstructing stone. --Sepsis resolved, afebrile.  Will change to oral antibiotics after stent placement and pursue transfer to ALF versus SNF.  LEFT hydronephrosis and hydroureter due to a 6 x 8 mm mid LEFT ureteral calculus. --Stent placement plan today per urology.  Nonspecific rectal wall thickening --Asymptomatic.  Follow-up as an outpatient if clinically indicated.  Acute thrombocytopenia.  Secondary to acute infection --Resolved  Essential hypertension --Stable.  Dementia without behavioral disturbance --Stable.  Continue home Aricept, Ativan, Namenda, Risperdal, Zoloft  Overall appears stable.  We will follow-up urology recommendations and plan transfer to [facility] when cleared by urology  Disposition Plan:   Status is: Inpatient  Remains inpatient appropriate because:Ongoing diagnostic testing needed not appropriate for outpatient work up and IV treatments appropriate due to intensity of illness or inability to take PO  Dispo: The patient is from: [name]              Anticipated d/c is to: [name] memory care              Anticipated d/c date is: 5/3?              Patient currently is not medically stable to d/c.  DVT prophylaxis: SCDs Code Status: DNR Family Communication:     Murray Hodgkins, MD  [facility] Direct contact:  see [email or web address] (further directions at bottom of note if needed) 7PM-7AM contact night coverage as at bottom of note 01/12/2020, 11:39 AM  LOS: 5 days   Significant Hospital Events   . 4/28 admitted for UTI   Consults:  [facility]    Procedures:  .   Significant Diagnostic Tests:  CT abdomen pelvis showed LEFT hydronephrosis and hydroureter due to a 6 x 8 mm mid LEFT ureteral calculus.   Micro Data:  . Urine culture Pseudomonas . Blood cultures Pseudomonas   Antimicrobials:  . Cefepime 4/28 >  Interval History/Subjective  No complaints but history is unreliable secondary to dementia.  Objective   Vitals:  Vitals:   01/12/20 0442 01/12/20 1115  BP: (!) 156/85 135/79  Pulse: 82 77  Resp: 14 18  Temp: 97.6 F (36.4 C) 97.9 F (36.6 C)  SpO2: 97% 98%    Exam:  Constitutional.  Appears calm and comfortable. Cardiovascular.  Regular rate and rhythm.  No murmur, rub or gallop.   Respiratory.  Clear to auscultation bilaterally.  No wheezes, rales or rhonchi.  Normal respiratory effort. Abdomen.  Soft, nontender. Psychiatric.  Pleasant, confused.  Responses are not appropriate.  I have personally reviewed the following:   Today's Data  . Basic metabolic panel potassium 3.3, remainder BMP unremarkable. . Hemoglobin stable at 10.7.  No leukocytosis.  Scheduled Meds: . [MAR Hold] cholecalciferol  2,000 Units Oral Daily  . [MAR Hold] donepezil  10 mg Oral QHS  . [MAR Hold] feeding supplement  1 Container Oral Daily  . [MAR Hold] loratadine  10 mg Oral Daily  . [  MAR Hold] memantine  10 mg Oral BID  . [MAR Hold] risperiDONE  0.25 mg Oral QHS  . [MAR Hold] sertraline  150 mg Oral Daily   Continuous Infusions: . [MAR Hold] sodium chloride 500 mL (01/12/20 1049)  . [MAR Hold] ceFEPime (MAXIPIME) IV 2 g (01/12/20 0831)  . [MAR Hold] potassium chloride 10 mEq (01/12/20 1051)    Principal Problem:   Sepsis secondary to UTI [facility]) Active Problems:   Essential (primary)  hypertension   Dementia without behavioral disturbance (HCC)   Toxic metabolic encephalopathy   Bacteremia due to Gram-negative bacteria   LOS: 5 days   How to contact the [facility] Attending or Consulting provider [place] or covering provider during after hours [place], for this patient?  1. Check the care team in [facility]  and look for a) attending/consulting TRH provider listed and b) the [facility] team listed 2. Log into [email or web address] and use [place]'s universal password to access. If you do not have the password, please contact the hospital operator. 3. Locate the [facility] provider you are looking for under [facility] and page to a number that you can be directly reached. 4. If you still have difficulty reaching the provider, please page the [facility] (Director on Call) for the Hospitalists listed on amion for assistance.

## 2020-01-12 NOTE — Discharge Summary (Addendum)
Physician Discharge Summary  Brandi Hanson [id number] DOB: April 12, 1936 DOA: 01/07/2020  PCP: System, Pcp Not In  Admit date: 01/07/2020 Discharge date: 01/13/2020  Recommendations for Outpatient Follow-up:  1. Follow-up left hydronephrosis and hydroureter, status post stent placement 2. Follow-up resolution of bacteremia. Finish Cipro 5/14. 3. Follow-up nonspecific rectal wall thickening, see below  Follow-up Information    Stoioff, Ronda Fairly, MD. Schedule an appointment as soon as possible for a visit in 2 week(s).   Specialty: Urology Contact information: [address] [place] [address] [phone]        Virgel Manifold, MD. Schedule an appointment as soon as possible for a visit in 3 week(s).   Specialty: Gastroenterology Contact information: [place] [address] [phone]            Discharge Diagnoses: Principal diagnosis is #1 1. Sepsis secondary to Pseudomonas bacteremia, secondary to Pseudomonas UTI with associated toxic metabolic encephalopathy; complicated by hydroureteronephrosis and obstructing stone. 2. LEFT hydronephrosis and hydroureter due to a 6 x 8 mm mid LEFT 3. ureteral calculus. 4. Nonspecific rectal wall thickening 5. Acute thrombocytopenia 6. Essential hypertension 7. Dementia without behavioral disturbance  Discharge Condition: improved Disposition: return to [place] recommendation: dysphagia 3 diet, think liquid  Filed Weights   01/07/20 1838 01/08/20 0018 01/12/20 1118  Weight: 52.2 kg 54.3 kg 54 kg    History of present illness:  84 year old woman PMH includes advanced dementia recently hospitalized with discharge 4/6 for severe sepsis secondary to Klebsiella UTI, who presented to the emergency room with altered mental status, was found to be febrile with leukocytosis and was admitted for sepsis secondary to UTI with toxic metabolic encephalopathy.  Hospital Course:    Patient was treated with empiric antibiotics with rapid clinical improvement.  Urine and blood culture was positive for Pseudomonas.  Given recurrent infection within the last 30 days, CT abdomen pelvis was performed which revealed obstructing ureteral stone and hydronephrosis.  Patient was seen by urology underwent stent placement, will complete antibiotics and follow-up in the outpatient setting for definitive stone management.  Discussed with Dr. Bernardo Heater today post stent, recommended observing for fever.  Remained stable and now stable for discharge..  Discussed with infectious disease 5/2 recommended 14 days antibiotics.  Although Cipro is not first-line for most conditions, it is the best drug in this circumstance.  Sepsis secondary to Pseudomonas bacteremia, secondary to Pseudomonas UTI with associated toxic metabolic encephalopathy; complicated by hydroureteronephrosis and obstructing stone. --Responding well to antibiotics, will complete a course of Cipro.  LEFT hydronephrosis and hydroureter due to a 6 x 8 mm mid LEFT ureteral calculus. --Stent placement 5/3 and follow-up as an outpatient with urology in 2 weeks  Nonspecific rectal wall thickening --Asymptomatic.  Significance unclear.  Discussed with gastroenterology. Follow-up as an outpatient  for visualization after resolution of infection.  In 2 to 3 weeks for direct visualization.  Acute thrombocytopenia.  Secondary to acute infection --Resolved  Essential hypertension --Remained stable.  Dementia without behavioral disturbance --Remained stable.  Continue home Aricept, Ativan, Namenda, Risperdal, [place] [facility] Events    4/28 admitted for UTI  Consults:   Urology   Procedures:   Cystoscopy and stent placement  Significant Diagnostic Tests:  CT abdomen pelvis showed LEFT hydronephrosis and hydroureter due to a 6 x 8 mm mid LEFT ureteral calculus.  Micro Data:   Urine culture  Pseudomonas  Blood cultures Pseudomonas  Antimicrobials:   Cefepime  4/28 > 5/4  Cipro 5/4 > 5/11  Today's assessment: No issues overnight noted. 97.8, 20, 76, 133/84 Constitutional.  Appears calm, comfortable. Respiratory.  Clear to auscultation bilaterally.  No wheezes, rales or rhonchi.  Normal respiratory effort. Cardiovascular.  Regular rate and rhythm.  No murmur, rub or gallop.   Respiratory.  Clear to auscultation bilaterally.  No wheezes, rales or rhonchi.  Normal respiratory effort. Psychiatric.  Pleasantly confused.  Responses are not appropriate.  BMP unremarkable.  Discharge Instructions   Allergies as of 01/13/2020      Reactions   Sulfa Antibiotics Nausea Only      Medication List    TAKE these medications   acetaminophen 325 MG tablet Commonly known as: TYLENOL Take 650 mg by mouth in the morning and at bedtime. What changed: Another medication with the same name was removed. Continue taking this medication, and follow the directions you see here.   Calcium 600+D 600-800 MG-UNIT Tabs Generic drug: Calcium Carb-Cholecalciferol Take 1 tablet by mouth 2 (two) times daily.   ciprofloxacin 500 MG tablet Commonly known as: CIPRO Take 1 tablet (500 mg total) by mouth 2 (two) times daily. Last dose 5/14 in PM.   cyanocobalamin 1000 MCG tablet Take 1 tablet (1,000 mcg total) by mouth daily.   donepezil 10 MG tablet Commonly known as: ARICEPT TAKE 1 TABLET BY MOUTH AT BEDTIME   fluticasone 50 MCG/ACT nasal spray Commonly known as: FLONASE Place 1 spray into both nostrils daily.   loratadine 10 MG tablet Commonly known as: CLARITIN Take 10 mg by mouth daily.   LORazepam 0.5 MG tablet Commonly known as: ATIVAN Take 0.5 tablets (0.25 mg total) by mouth 2 (two) times daily as needed for anxiety or sleep.   melatonin 3 MG Tabs tablet Take 3 mg by mouth at bedtime.   memantine 10 MG tablet Commonly known as: NAMENDA Take 10 mg by mouth 2 (two) times  daily.   ondansetron 8 MG tablet Commonly known as: ZOFRAN Take 8 mg by mouth every 8 (eight) hours as needed for nausea or vomiting.   polyethylene glycol 17 g packet Commonly known as: MIRALAX / GLYCOLAX Take 17 g by mouth daily.   propranolol 20 MG tablet Commonly known as: INDERAL Take 1 tablet (20 mg total) by mouth 2 (two) times daily.   risperiDONE 0.25 MG tablet Commonly known as: RISPERDAL Take 0.25 mg by mouth at bedtime.   sertraline 100 MG tablet Commonly known as: ZOLOFT Take 150 mg by mouth daily.   SYSTANE COMPLETE OP Place 1 drop into both eyes in the morning and at bedtime.   Vitamin D3 25 MCG tablet Commonly known as: Vitamin D Take 2 tablets (2,000 Units total) by mouth daily.            Durable Medical Equipment  (From admission, onward)         Start     Ordered   01/12/20 1400  For home use only DME lightweight manual wheelchair with seat cushion  Once     01/12/20 1359         Allergies  Allergen Reactions  . Sulfa Antibiotics Nausea Only    The results of significant diagnostics from this hospitalization (including imaging, microbiology, ancillary and laboratory) are listed below for reference.    Significant Diagnostic Studies: CT Head Wo Contrast  Result Date: 01/07/2020 CLINICAL DATA:  Encephalopathy. Additional provided: Altered mental status, history of dementia. EXAM: CT HEAD WITHOUT CONTRAST TECHNIQUE: Contiguous axial  images were obtained from the base of the skull through the vertex without intravenous contrast. COMPARISON:  Noncontrast head CT 12/11/2016 FINDINGS: Brain: Mild ill-defined hypoattenuation within the cerebral white matter is nonspecific, but consistent with chronic small vessel ischemic disease. Mild generalized parenchymal atrophy. A small chronic lacunar infarct is questioned within the left cerebellar hemisphere (series 4, image 54). Findings are stable as compared to prior head CT 12/12/2019. There is no acute  intracranial hemorrhage. No demarcated cortical infarct. No extra-axial fluid collection. No evidence of intracranial mass. No midline shift. Vascular: No hyperdense vessel.  Atherosclerotic calcifications. Skull: Normal. Negative for fracture or focal lesion. Sinuses/Orbits: Visualized orbits show no acute finding. No significant paranasal sinus disease or mastoid effusion at the imaged levels. IMPRESSION: 1. No evidence of acute intracranial abnormality. 2. Stable, mild generalized parenchymal atrophy and background chronic small vessel ischemic disease. A small chronic lacunar infarct is questioned within the left cerebellum and, in retrospect, this finding was present on prior head CT 12/12/2019. Electronically Signed   By: Kellie Simmering DO   On: 01/07/2020 19:20   DG Chest Portable 1 View  Result Date: 01/07/2020 CLINICAL DATA:  Shortness of breath EXAM: PORTABLE CHEST 1 VIEW COMPARISON:  12/12/2019 FINDINGS: Patient is rotated. Chronic interstitial prominence. No new consolidation or edema. Pleural effusion or pneumothorax. Stable cardiomediastinal contours. IMPRESSION: No acute process in the chest. Electronically Signed   By: Macy Mis M.D.   On: 01/07/2020 19:26   DG C-Arm 1-60 Min-No Report  Result Date: 01/12/2020 Fluoroscopy was utilized by the requesting physician.  No radiographic interpretation.   CT RENAL STONE STUDY  Result Date: 01/11/2020 CLINICAL DATA:  Sepsis due to recurrent UTI, leukocytosis, febrile, bacteremia due to Klebsiella, toxic metabolic encephalopathy EXAM: CT ABDOMEN AND PELVIS WITHOUT CONTRAST TECHNIQUE: Multidetector CT imaging of the abdomen and pelvis was performed following the standard protocol without IV contrast. Sagittal and coronal MPR images reconstructed from axial data set. Oral contrast not administered. COMPARISON:  01/10/2017 FINDINGS: Lower chest: Small BILATERAL pleural effusions and lower lobe atelectasis greater on LEFT. Low-attenuation of  circulating blood at cardiac chambers. Mitral annular calcification. Hepatobiliary: Gallbladder and liver unremarkable Pancreas: Normal appearance Spleen: Normal appearance Adrenals/Urinary Tract: Adrenal glands normal appearance. BILATERAL renal calculi. LEFT hydronephrosis and proximal hydroureter due to a 6 x 8 mm LEFT ureteral calculus image 47. No RIGHT hydronephrosis or hydroureter. Bladder unremarkable. Stomach/Bowel: Sigmoid diverticulosis without evidence of diverticulitis. Mild rectosigmoid wall thickening with edema in the perirectal and presacral fat, nonspecific, proctitis not excluded. Appendix not definitely visualized. Stomach and remaining bowel loops otherwise normal appearance. Vascular/Lymphatic: Atherosclerotic calcifications aorta and iliac arteries without aneurysm. No adenopathy. Reproductive: Uterus surgically absent.  Ovaries not visualized. Other: Tiny LEFT inguinal hernia containing fat. No free air or free fluid. Musculoskeletal: Bones demineralized. Scattered degenerative changes lumbar spine. IMPRESSION: LEFT hydronephrosis and hydroureter due to a 6 x 8 mm mid LEFT ureteral calculus. Additional BILATERAL nonobstructing renal calculi. Sigmoid diverticulosis. Rectal wall thickening with edema in perirectal and presacral fat, nonspecific but could reflect proctitis recommend correlation with proctoscopy. Tiny LEFT inguinal hernia containing fat. Small BILATERAL pleural effusions and bibasilar atelectasis. Question anemia. Electronically Signed   By: Lavonia Dana M.D.   On: 01/11/2020 12:04    Microbiology: Recent Results (from the past 240 hour(s))  Blood Culture (routine x 2)     Status: Abnormal   Collection Time: 01/07/20  6:48 PM   Specimen: Left Antecubital; Blood  Result Value Ref Range Status  Specimen Description   Final    LEFT ANTECUBITAL Performed at [facility], [address] [place], [place] [address]    Special Requests   Final    BOTTLES DRAWN  AEROBIC AND ANAEROBIC Blood Culture adequate volume Performed at [facility], [address] [place], [place] [address]    Culture  Setup Time   Final    GRAM NEGATIVE RODS AEROBIC BOTTLE ONLY CRITICAL VALUE NOTED.  VALUE IS CONSISTENT WITH PREVIOUSLY REPORTED AND CALLED VALUE. Performed at [facility], [address] [place], [place] [address]    Culture (A)  Final    PSEUDOMONAS AERUGINOSA SUSCEPTIBILITIES PERFORMED ON PREVIOUS CULTURE WITHIN THE LAST 5 DAYS. Performed at [facility], [place] [address] [place], [place] [address]    Report Status 01/10/2020 FINAL  Final  Urine culture     Status: Abnormal   Collection Time: 01/07/20  6:48 PM   Specimen: In/Out Cath Urine  Result Value Ref Range Status   Specimen Description   Final    IN/OUT CATH URINE Performed at [facility], [address] [place], [place] [address]    Special Requests   Final    NONE Performed at [facility], [address] [place], [place] [address]    Culture >=100,000 COLONIES/mL PSEUDOMONAS AERUGINOSA (A)  Final   Report Status 01/09/2020 FINAL  Final   Organism ID, Bacteria PSEUDOMONAS AERUGINOSA (A)  Final      Susceptibility   Pseudomonas aeruginosa - MIC*    CEFTAZIDIME <=1 SENSITIVE Sensitive     CIPROFLOXACIN <=0.25 SENSITIVE Sensitive     GENTAMICIN <=1 SENSITIVE Sensitive     IMIPENEM 1 SENSITIVE Sensitive     PIP/TAZO <=4 SENSITIVE Sensitive     CEFEPIME <=1 SENSITIVE Sensitive     * >=100,000 COLONIES/mL PSEUDOMONAS AERUGINOSA  Blood Culture (routine x 2)     Status: Abnormal   Collection Time: 01/07/20  6:53 PM   Specimen: BLOOD RIGHT HAND  Result Value Ref Range Status   Specimen Description   Final    BLOOD RIGHT HAND Performed at [facility], [address], [place], [place] [address]    Special Requests   Final    BOTTLES DRAWN AEROBIC AND ANAEROBIC Blood Culture adequate volume Performed at [facility],  [address], [place],  [address]    Culture  Setup Time   Final    GRAM NEGATIVE RODS IN BOTH AEROBIC AND ANAEROBIC BOTTLES CRITICAL RESULT CALLED TO, READ BACK BY AND VERIFIED WITH: JASON ROBBINS AT [id number] ON 01/08/20 SNG Performed at [facility], [place] [address] [place], [place] [address]    Culture PSEUDOMONAS AERUGINOSA (A)  Final   Report Status 01/10/2020 FINAL  Final   Organism ID, Bacteria PSEUDOMONAS AERUGINOSA  Final      Susceptibility   Pseudomonas aeruginosa - MIC*    CEFTAZIDIME <=1 SENSITIVE Sensitive     CIPROFLOXACIN <=0.25 SENSITIVE Sensitive     GENTAMICIN <=1 SENSITIVE Sensitive     IMIPENEM 1 SENSITIVE Sensitive     PIP/TAZO <=4 SENSITIVE Sensitive     CEFEPIME <=1 SENSITIVE Sensitive     * PSEUDOMONAS AERUGINOSA  Blood Culture ID Panel (Reflexed)     Status: Abnormal   Collection Time: 01/07/20  6:53 PM  Result Value Ref Range Status   Enterococcus species NOT DETECTED NOT DETECTED Final   Listeria monocytogenes NOT DETECTED NOT DETECTED Final   Staphylococcus species  NOT DETECTED NOT DETECTED Final   Staphylococcus aureus (BCID) NOT DETECTED NOT DETECTED Final   Streptococcus species NOT DETECTED NOT DETECTED Final   Streptococcus agalactiae NOT DETECTED NOT DETECTED Final   Streptococcus pneumoniae NOT DETECTED NOT DETECTED Final   Streptococcus pyogenes NOT DETECTED NOT DETECTED Final   Acinetobacter baumannii NOT DETECTED NOT DETECTED Final   Enterobacteriaceae species NOT DETECTED NOT DETECTED Final   Enterobacter cloacae complex NOT DETECTED NOT DETECTED Final   Escherichia coli NOT DETECTED NOT DETECTED Final   Klebsiella oxytoca NOT DETECTED NOT DETECTED Final   Klebsiella pneumoniae NOT DETECTED NOT DETECTED Final   Proteus species NOT DETECTED NOT DETECTED Final   Serratia marcescens NOT DETECTED NOT DETECTED Final   Carbapenem resistance NOT DETECTED NOT DETECTED Final   Haemophilus influenzae NOT DETECTED NOT DETECTED Final    Neisseria meningitidis NOT DETECTED NOT DETECTED Final   Pseudomonas aeruginosa DETECTED (A) NOT DETECTED Final    Comment: CRITICAL RESULT CALLED TO, READ BACK BY AND VERIFIED WITH: JASON ROBBINS AT [id number] ON 01/08/20 SNG    Candida albicans NOT DETECTED NOT DETECTED Final   Candida glabrata NOT DETECTED NOT DETECTED Final   Candida krusei NOT DETECTED NOT DETECTED Final   Candida parapsilosis NOT DETECTED NOT DETECTED Final   Candida tropicalis NOT DETECTED NOT DETECTED Final    Comment: Performed at [facility], [address] [place], [place] [address]  Respiratory Panel by RT PCR (Flu A&B, Covid) - Nasopharyngeal Swab     Status: None   Collection Time: 01/07/20  6:54 PM   Specimen: Nasopharyngeal Swab  Result Value Ref Range Status   SARS Coronavirus 2 by RT PCR NEGATIVE NEGATIVE Final    Comment: (NOTE) SARS-CoV-2 target nucleic acids are NOT DETECTED. The SARS-CoV-2 RNA is generally detectable in upper respiratoy specimens during the acute phase of infection. The lowest concentration of SARS-CoV-2 viral copies this assay can detect is 131 copies/mL. A negative result does not preclude SARS-Cov-2 infection and should not be used as the sole basis for treatment or other patient management decisions. A negative result may occur with  improper specimen collection/handling, submission of specimen other than nasopharyngeal swab, presence of viral mutation(s) within the areas targeted by this assay, and inadequate number of viral copies (<131 copies/mL). A negative result must be combined with clinical observations, patient history, and epidemiological information. The expected result is Negative. Fact Sheet for Patients:  PinkCheek.be Fact Sheet for Healthcare Providers:  GravelBags.it This test is not yet ap proved or cleared by the [place] FDA and  has been authorized for detection and/or diagnosis of  SARS-CoV-2 by FDA under an Emergency Use Authorization (EUA). This EUA will remain  in effect (meaning this test can be used) for the duration of the COVID-19 declaration under Section 564(b)(1) of the Act, 21 U.S.C. section 360bbb-3(b)(1), unless the authorization is terminated or revoked sooner.    Influenza A by PCR NEGATIVE NEGATIVE Final   Influenza B by PCR NEGATIVE NEGATIVE Final    Comment: (NOTE) The Xpert Xpress SARS-CoV-2/FLU/RSV assay is intended as an aid in  the diagnosis of influenza from Nasopharyngeal swab specimens and  should not be used as a sole basis for treatment. Nasal washings and  aspirates are unacceptable for Xpert Xpress SARS-CoV-2/FLU/RSV  testing. Fact Sheet for Patients: PinkCheek.be Fact Sheet for Healthcare Providers: GravelBags.it This test is not yet approved or cleared by the [place] FDA and  has been authorized for detection and/or diagnosis  of SARS-CoV-2 by  FDA under an Emergency Use Authorization (EUA). This EUA will remain  in effect (meaning this test can be used) for the duration of the  Covid-19 declaration under Section 564(b)(1) of the Act, 21  U.S.C. section 360bbb-3(b)(1), unless the authorization is  terminated or revoked. Performed at [facility] [address] [place], [place] [address]   MRSA PCR Screening     Status: None   Collection Time: 01/08/20  5:06 AM   Specimen: Nasopharyngeal  Result Value Ref Range Status   MRSA by PCR NEGATIVE NEGATIVE Final    Comment:        The GeneXpert MRSA Assay (FDA approved for NASAL specimens only), is one component of a comprehensive MRSA colonization surveillance program. It is not intended to diagnose MRSA infection nor to guide or monitor treatment for MRSA infections. Performed at [facility], [address] [place], [place] [address]   Respiratory Panel by RT PCR (Flu A&B, Covid) -  Nasopharyngeal Swab     Status: None   Collection Time: 01/11/20  3:08 PM   Specimen: Nasopharyngeal Swab  Result Value Ref Range Status   SARS Coronavirus 2 by RT PCR NEGATIVE NEGATIVE Final    Comment: (NOTE) SARS-CoV-2 target nucleic acids are NOT DETECTED. The SARS-CoV-2 RNA is generally detectable in upper respiratoy specimens during the acute phase of infection. The lowest concentration of SARS-CoV-2 viral copies this assay can detect is 131 copies/mL. A negative result does not preclude SARS-Cov-2 infection and should not be used as the sole basis for treatment or other patient management decisions. A negative result may occur with  improper specimen collection/handling, submission of specimen other than nasopharyngeal swab, presence of viral mutation(s) within the areas targeted by this assay, and inadequate number of viral copies (<131 copies/mL). A negative result must be combined with clinical observations, patient history, and epidemiological information. The expected result is Negative. Fact Sheet for Patients:  PinkCheek.be Fact Sheet for Healthcare Providers:  GravelBags.it This test is not yet ap proved or cleared by the [place] FDA and  has been authorized for detection and/or diagnosis of SARS-CoV-2 by FDA under an Emergency Use Authorization (EUA). This EUA will remain  in effect (meaning this test can be used) for the duration of the COVID-19 declaration under Section 564(b)(1) of the Act, 21 U.S.C. section 360bbb-3(b)(1), unless the authorization is terminated or revoked sooner.    Influenza A by PCR NEGATIVE NEGATIVE Final   Influenza B by PCR NEGATIVE NEGATIVE Final    Comment: (NOTE) The Xpert Xpress SARS-CoV-2/FLU/RSV assay is intended as an aid in  the diagnosis of influenza from Nasopharyngeal swab specimens and  should not be used as a sole basis for treatment. Nasal washings and  aspirates  are unacceptable for Xpert Xpress SARS-CoV-2/FLU/RSV  testing. Fact Sheet for Patients: PinkCheek.be Fact Sheet for Healthcare Providers: GravelBags.it This test is not yet approved or cleared by the [place] FDA and  has been authorized for detection and/or diagnosis of SARS-CoV-2 by  FDA under an Emergency Use Authorization (EUA). This EUA will remain  in effect (meaning this test can be used) for the duration of the  Covid-19 declaration under Section 564(b)(1) of the Act, 21  U.S.C. section 360bbb-3(b)(1), unless the authorization is  terminated or revoked. Performed at [facility], [address], [place], [place] [address]   Urine Culture     Status: None (Preliminary result)   Collection Time: 01/12/20 12:58 PM  Specimen: Urine, Cystoscope  Result Value Ref Range Status   Specimen Description CYSTOSCOPY  Final   Special Requests LEFT RENAL POLVIS  Final   Culture PENDING  Incomplete   Report Status PENDING  Incomplete     Labs: Basic Metabolic Panel: Recent Labs  Lab 01/07/20 1841 01/08/20 0029 01/12/20 0753 01/13/20 0528  NA 137 135 140 139  K 3.7 3.4* 3.3* 3.8  CL 102 102 106 106  CO2 25 28 26 27   GLUCOSE 167* 137* 93 95  BUN 29* 23 12 16   CREATININE 0.66 0.59 0.69 0.49  CALCIUM 8.9 8.7* 8.6* 8.8*   Liver Function Tests: Recent Labs  Lab 01/07/20 1841  AST 19  ALT 15  ALKPHOS 66  BILITOT 0.5  PROT 6.1*  ALBUMIN 3.2*   CBC: Recent Labs  Lab 01/07/20 1841 01/08/20 0029 01/09/20 0438 01/11/20 0616 01/12/20 0753  WBC 18.5* 16.8* 10.2 4.7 5.7  NEUTROABS 16.3*  --   --   --   --   HGB 10.2* 9.8* 9.4* 10.7* 10.7*  HCT 31.3* 30.4* 29.0* 32.2* 32.7*  MCV 88.9 88.9 89.0 86.8 87.0  PLT 141* 131* 107* 160 168    Principal Problem:   Sepsis secondary to UTI ([place]) Active Problems:   Essential (primary) hypertension   Dementia without behavioral disturbance (HCC)   Toxic  metabolic encephalopathy   Bacteremia due to Gram-negative bacteria   Time coordinating discharge: 35 minutes  Signed:  Murray Hodgkins, MD  [facility]  01/13/2020, 10:47 AM

## 2020-01-12 NOTE — Consult Note (Signed)
Urology Consult  Requesting physician: Dr. Sarajane Jews  Reason for consultation: Left ureteral stone with hydronephrosis  Chief Complaint: N/A  History of Present Illness: Brandi Hanson is a 84 y.o. female with advanced dementia who was discharged 4/6 for sepsis secondary to a Klebsiella UTI.  She was readmitted 01/07/2020 after being seen in the ED for evaluation of altered mental status, fever, leukocytosis and toxic metabolic encephalopathy.  She had Pseudomonas bacteremia secondary to Pseudomonas UTI.  She has responded clinically and discharge was anticipated today however a CT of the abdomen and pelvis without contrast was performed with her history of recurrent sepsis and she was found to have a left proximal ureteral calculus with hydronephrosis.  She has no complaints.  Past Medical History:  Diagnosis Date  . Abdominal distention   . Abdominal pain   . Anxiety   . Arthritis   . Bacteremia due to Gram-negative bacteria 01/08/2020  . Benign essential HTN 01/02/2012  . Breast cancer in situ 07/22/2011  . Cancer ([place])    skin  . Confusion   . Depression   . Generalized headaches   . GERD (gastroesophageal reflux disease)   . Hyperlipidemia   . Hyperlipidemia 01/02/2012  . Hypertension   . Memory loss   . Osteoporosis   . Palpitations   . PVC (premature ventricular contraction)   . Rectal pain   . Sleep apnea     Past Surgical History:  Procedure Laterality Date  . ABDOMINAL HYSTERECTOMY  1978  . [place]   left  . breast cancer  10/2010   ductal carcinoma in situ, left breast  . CATARACT EXTRACTION  2011   bilateral  . FOOT SURGERY  2005  . KIDNEY STONE SURGERY  2011  . ROTATOR CUFF REPAIR  2007    Home Medications:  Current Meds  Medication Sig  . acetaminophen (TYLENOL) 325 MG tablet Take 650 mg by mouth in the morning and at bedtime.   Marland Kitchen acetaminophen (TYLENOL) 500 MG tablet Take 500 mg by mouth every 4 (four) hours as needed for mild  pain or fever.  . Calcium Carb-Cholecalciferol (CALCIUM 600+D) 600-800 MG-UNIT TABS Take 1 tablet by mouth 2 (two) times daily.   . cholecalciferol (VITAMIN D) 25 MCG tablet Take 2 tablets (2,000 Units total) by mouth daily.  Marland Kitchen donepezil (ARICEPT) 10 MG tablet TAKE 1 TABLET BY MOUTH AT BEDTIME (Patient taking differently: Take 10 mg by mouth at bedtime. )  . fluticasone (FLONASE) 50 MCG/ACT nasal spray Place 1 spray into both nostrils daily.   Marland Kitchen loratadine (CLARITIN) 10 MG tablet Take 10 mg by mouth daily.  Marland Kitchen LORazepam (ATIVAN) 0.5 MG tablet Take 0.5 tablets (0.25 mg total) by mouth 2 (two) times daily as needed for anxiety or sleep.  . melatonin 3 MG TABS tablet Take 3 mg by mouth at bedtime.  . memantine (NAMENDA) 10 MG tablet Take 10 mg by mouth 2 (two) times daily.  . ondansetron (ZOFRAN) 8 MG tablet Take 8 mg by mouth every 8 (eight) hours as needed for nausea or vomiting.  . polyethylene glycol (MIRALAX / GLYCOLAX) 17 g packet Take 17 g by mouth daily.  . propranolol (INDERAL) 20 MG tablet Take 1 tablet (20 mg total) by mouth 2 (two) times daily.  Marland Kitchen Propylene Glycol (SYSTANE COMPLETE OP) Place 1 drop into both eyes in the morning and at bedtime.   . risperiDONE (RISPERDAL) 0.25 MG tablet Take 0.25 mg by  mouth at bedtime.  . sertraline (ZOLOFT) 100 MG tablet Take 150 mg by mouth daily.  . vitamin B-12 1000 MCG tablet Take 1 tablet (1,000 mcg total) by mouth daily.    Allergies:  Allergies  Allergen Reactions  . Sulfa Antibiotics Nausea Only    Family History  Problem Relation Age of Onset  . Cancer Mother        breast  . Crohn's disease Mother   . Cancer Father        lung  . Hypertension Father   . Cancer Sister        breast and uterine  . Hypertension Sister   . Heart disease Maternal Grandmother   . Cancer Maternal Grandfather        colon  . Hypertension Maternal Grandfather   . Heart disease Paternal Grandmother   . Heart disease Paternal Grandfather     Social  History:  reports that she has never smoked. She has never used smokeless tobacco. She reports that she does not drink alcohol or use drugs.  Obtained via chart review  ROS: Unable to obtain secondary to dementia  Physical Exam:  Vital signs in last 24 hours: Temp:  [97.6 F (36.4 C)-98.4 F (36.9 C)] 97.6 F (36.4 C) (05/03 0442) Pulse Rate:  [72-82] 82 (05/03 0442) Resp:  [14-17] 14 (05/03 0442) BP: (142-156)/(79-85) 156/85 (05/03 0442) SpO2:  [97 %-100 %] 97 % (05/03 0442) Constitutional:  Alert, No acute distress HEENT: [place] AT, moist mucus membranes.  Trachea midline, no masses Cardiovascular: Regular rate and rhythm, no clubbing, cyanosis, or edema. Respiratory: Normal respiratory effort, lungs clear bilaterally GI: Abdomen is soft, nontender, nondistended, no abdominal masses GU: No CVA tenderness Skin: No rashes, bruises or suspicious lesions Neurologic: Grossly intact, no focal deficits, moving all 4 extremities   Laboratory Data:  Recent Labs    01/11/20 0616 01/12/20 0753  WBC 4.7 5.7  HGB 10.7* 10.7*  HCT 32.2* 32.7*   No results for input(s): NA, K, CL, CO2, GLUCOSE, BUN, CREATININE, CALCIUM in the last 72 hours. No results for input(s): LABPT, INR in the last 72 hours. No results for input(s): LABURIN in the last 72 hours. Results for orders placed or performed during the hospital encounter of 01/07/20  Blood Culture (routine x 2)     Status: Abnormal   Collection Time: 01/07/20  6:48 PM   Specimen: Left Antecubital; Blood  Result Value Ref Range Status   Specimen Description   Final    LEFT ANTECUBITAL Performed at [facility], [address], [place], [place] [address]    Special Requests   Final    BOTTLES DRAWN AEROBIC AND ANAEROBIC Blood Culture adequate volume Performed at [facility], [address] [place], [place] [address]    Culture  Setup Time   Final    GRAM NEGATIVE RODS AEROBIC BOTTLE ONLY CRITICAL VALUE NOTED.   VALUE IS CONSISTENT WITH PREVIOUSLY REPORTED AND CALLED VALUE. Performed at [facility], [address] [place], [place] [address]    Culture (A)  Final    PSEUDOMONAS AERUGINOSA SUSCEPTIBILITIES PERFORMED ON PREVIOUS CULTURE WITHIN THE LAST 5 DAYS. Performed at [facility], [place] [address], [place], [place] [address]    Report Status 01/10/2020 FINAL  Final  Urine culture     Status: Abnormal   Collection Time: 01/07/20  6:48 PM   Specimen: In/Out Cath Urine  Result Value Ref Range Status   Specimen Description  Final    IN/OUT CATH URINE Performed at [facility], [address] [place], [place] [address]    Special Requests   Final    NONE Performed at [facility], [address] [place], [place] [address]    Culture >=100,000 COLONIES/mL PSEUDOMONAS AERUGINOSA (A)  Final   Report Status 01/09/2020 FINAL  Final   Organism ID, Bacteria PSEUDOMONAS AERUGINOSA (A)  Final      Susceptibility   Pseudomonas aeruginosa - MIC*    CEFTAZIDIME <=1 SENSITIVE Sensitive     CIPROFLOXACIN <=0.25 SENSITIVE Sensitive     GENTAMICIN <=1 SENSITIVE Sensitive     IMIPENEM 1 SENSITIVE Sensitive     PIP/TAZO <=4 SENSITIVE Sensitive     CEFEPIME <=1 SENSITIVE Sensitive     * >=100,000 COLONIES/mL PSEUDOMONAS AERUGINOSA  Blood Culture (routine x 2)     Status: Abnormal   Collection Time: 01/07/20  6:53 PM   Specimen: BLOOD RIGHT HAND  Result Value Ref Range Status   Specimen Description   Final    BLOOD RIGHT HAND Performed at [facility], [address] [place], [place] [address]    Special Requests   Final    BOTTLES DRAWN AEROBIC AND ANAEROBIC Blood Culture adequate volume Performed at [facility], [address] [place], [place] [address]    Culture  Setup Time   Final    GRAM NEGATIVE RODS IN BOTH AEROBIC AND ANAEROBIC BOTTLES CRITICAL RESULT CALLED TO, READ BACK BY AND VERIFIED WITH: JASON ROBBINS AT [id number] ON 01/08/20  SNG Performed at [facility], [place] [address], [place],  [address]    Culture PSEUDOMONAS AERUGINOSA (A)  Final   Report Status 01/10/2020 FINAL  Final   Organism ID, Bacteria PSEUDOMONAS AERUGINOSA  Final      Susceptibility   Pseudomonas aeruginosa - MIC*    CEFTAZIDIME <=1 SENSITIVE Sensitive     CIPROFLOXACIN <=0.25 SENSITIVE Sensitive     GENTAMICIN <=1 SENSITIVE Sensitive     IMIPENEM 1 SENSITIVE Sensitive     PIP/TAZO <=4 SENSITIVE Sensitive     CEFEPIME <=1 SENSITIVE Sensitive     * PSEUDOMONAS AERUGINOSA  Blood Culture ID Panel (Reflexed)     Status: Abnormal   Collection Time: 01/07/20  6:53 PM  Result Value Ref Range Status   Enterococcus species NOT DETECTED NOT DETECTED Final   Listeria monocytogenes NOT DETECTED NOT DETECTED Final   Staphylococcus species NOT DETECTED NOT DETECTED Final   Staphylococcus aureus (BCID) NOT DETECTED NOT DETECTED Final   Streptococcus species NOT DETECTED NOT DETECTED Final   Streptococcus agalactiae NOT DETECTED NOT DETECTED Final   Streptococcus pneumoniae NOT DETECTED NOT DETECTED Final   Streptococcus pyogenes NOT DETECTED NOT DETECTED Final   Acinetobacter baumannii NOT DETECTED NOT DETECTED Final   Enterobacteriaceae species NOT DETECTED NOT DETECTED Final   Enterobacter cloacae complex NOT DETECTED NOT DETECTED Final   Escherichia coli NOT DETECTED NOT DETECTED Final   Klebsiella oxytoca NOT DETECTED NOT DETECTED Final   Klebsiella pneumoniae NOT DETECTED NOT DETECTED Final   Proteus species NOT DETECTED NOT DETECTED Final   Serratia marcescens NOT DETECTED NOT DETECTED Final   Carbapenem resistance NOT DETECTED NOT DETECTED Final   Haemophilus influenzae NOT DETECTED NOT DETECTED Final   Neisseria meningitidis NOT DETECTED NOT DETECTED Final   Pseudomonas aeruginosa DETECTED (A) NOT DETECTED Final    Comment: CRITICAL RESULT CALLED TO, READ BACK BY AND VERIFIED WITH: JASON ROBBINS AT 1624  ON 01/08/20 SNG     Candida albicans NOT DETECTED NOT DETECTED Final   Candida glabrata NOT DETECTED NOT DETECTED Final   Candida krusei NOT DETECTED NOT DETECTED Final   Candida parapsilosis NOT DETECTED NOT DETECTED Final   Candida tropicalis NOT DETECTED NOT DETECTED Final    Comment: Performed at [facility], [address] [place], [place] [address]  Respiratory Panel by RT PCR (Flu A&B, Covid) - Nasopharyngeal Swab     Status: None   Collection Time: 01/07/20  6:54 PM   Specimen: Nasopharyngeal Swab  Result Value Ref Range Status   SARS Coronavirus 2 by RT PCR NEGATIVE NEGATIVE Final    Comment: (NOTE) SARS-CoV-2 target nucleic acids are NOT DETECTED. The SARS-CoV-2 RNA is generally detectable in upper respiratoy specimens during the acute phase of infection. The lowest concentration of SARS-CoV-2 viral copies this assay can detect is 131 copies/mL. A negative result does not preclude SARS-Cov-2 infection and should not be used as the sole basis for treatment or other patient management decisions. A negative result may occur with  improper specimen collection/handling, submission of specimen other than nasopharyngeal swab, presence of viral mutation(s) within the areas targeted by this assay, and inadequate number of viral copies (<131 copies/mL). A negative result must be combined with clinical observations, patient history, and epidemiological information. The expected result is Negative. Fact Sheet for Patients:  PinkCheek.be Fact Sheet for Healthcare Providers:  GravelBags.it This test is not yet ap proved or cleared by the [place] FDA and  has been authorized for detection and/or diagnosis of SARS-CoV-2 by FDA under an Emergency Use Authorization (EUA). This EUA will remain  in effect (meaning this test can be used) for the duration of the COVID-19 declaration under Section 564(b)(1) of the Act, 21 U.S.C. section  360bbb-3(b)(1), unless the authorization is terminated or revoked sooner.    Influenza A by PCR NEGATIVE NEGATIVE Final   Influenza B by PCR NEGATIVE NEGATIVE Final    Comment: (NOTE) The Xpert Xpress SARS-CoV-2/FLU/RSV assay is intended as an aid in  the diagnosis of influenza from Nasopharyngeal swab specimens and  should not be used as a sole basis for treatment. Nasal washings and  aspirates are unacceptable for Xpert Xpress SARS-CoV-2/FLU/RSV  testing. Fact Sheet for Patients: PinkCheek.be Fact Sheet for Healthcare Providers: GravelBags.it This test is not yet approved or cleared by the [place] FDA and  has been authorized for detection and/or diagnosis of SARS-CoV-2 by  FDA under an Emergency Use Authorization (EUA). This EUA will remain  in effect (meaning this test can be used) for the duration of the  Covid-19 declaration under Section 564(b)(1) of the Act, 21  U.S.C. section 360bbb-3(b)(1), unless the authorization is  terminated or revoked. Performed at [facility], [address] [place], [place] [address]   MRSA PCR Screening     Status: None   Collection Time: 01/08/20  5:06 AM   Specimen: Nasopharyngeal  Result Value Ref Range Status   MRSA by PCR NEGATIVE NEGATIVE Final    Comment:        The GeneXpert MRSA Assay (FDA approved for NASAL specimens only), is one component of a comprehensive MRSA colonization surveillance program. It is not intended to diagnose MRSA infection nor to guide or monitor treatment for MRSA infections. Performed at [facility], [address] [place], [place] [address]   Respiratory Panel by RT PCR (Flu A&B, Covid) - Nasopharyngeal Swab     Status:  None   Collection Time: 01/11/20  3:08 PM   Specimen: Nasopharyngeal Swab  Result Value Ref Range Status   SARS Coronavirus 2 by RT PCR NEGATIVE NEGATIVE Final    Comment: (NOTE) SARS-CoV-2 target  nucleic acids are NOT DETECTED. The SARS-CoV-2 RNA is generally detectable in upper respiratoy specimens during the acute phase of infection. The lowest concentration of SARS-CoV-2 viral copies this assay can detect is 131 copies/mL. A negative result does not preclude SARS-Cov-2 infection and should not be used as the sole basis for treatment or other patient management decisions. A negative result may occur with  improper specimen collection/handling, submission of specimen other than nasopharyngeal swab, presence of viral mutation(s) within the areas targeted by this assay, and inadequate number of viral copies (<131 copies/mL). A negative result must be combined with clinical observations, patient history, and epidemiological information. The expected result is Negative. Fact Sheet for Patients:  PinkCheek.be Fact Sheet for Healthcare Providers:  GravelBags.it This test is not yet ap proved or cleared by the [place] FDA and  has been authorized for detection and/or diagnosis of SARS-CoV-2 by FDA under an Emergency Use Authorization (EUA). This EUA will remain  in effect (meaning this test can be used) for the duration of the COVID-19 declaration under Section 564(b)(1) of the Act, 21 U.S.C. section 360bbb-3(b)(1), unless the authorization is terminated or revoked sooner.    Influenza A by PCR NEGATIVE NEGATIVE Final   Influenza B by PCR NEGATIVE NEGATIVE Final    Comment: (NOTE) The Xpert Xpress SARS-CoV-2/FLU/RSV assay is intended as an aid in  the diagnosis of influenza from Nasopharyngeal swab specimens and  should not be used as a sole basis for treatment. Nasal washings and  aspirates are unacceptable for Xpert Xpress SARS-CoV-2/FLU/RSV  testing. Fact Sheet for Patients: PinkCheek.be Fact Sheet for Healthcare Providers: GravelBags.it This test is not  yet approved or cleared by the [place] FDA and  has been authorized for detection and/or diagnosis of SARS-CoV-2 by  FDA under an Emergency Use Authorization (EUA). This EUA will remain  in effect (meaning this test can be used) for the duration of the  Covid-19 declaration under Section 564(b)(1) of the Act, 21  U.S.C. section 360bbb-3(b)(1), unless the authorization is  terminated or revoked. Performed at [facility], [address], [place], [place] [address]      Radiologic Imaging: CT was personally reviewed.  On my review stone dimensions are 8 x 15 mm.  CT RENAL STONE STUDY  Result Date: 01/11/2020 CLINICAL DATA:  Sepsis due to recurrent UTI, leukocytosis, febrile, bacteremia due to Klebsiella, toxic metabolic encephalopathy EXAM: CT ABDOMEN AND PELVIS WITHOUT CONTRAST TECHNIQUE: Multidetector CT imaging of the abdomen and pelvis was performed following the standard protocol without IV contrast. Sagittal and coronal MPR images reconstructed from axial data set. Oral contrast not administered. COMPARISON:  01/10/2017 FINDINGS: Lower chest: Small BILATERAL pleural effusions and lower lobe atelectasis greater on LEFT. Low-attenuation of circulating blood at cardiac chambers. Mitral annular calcification. Hepatobiliary: Gallbladder and liver unremarkable Pancreas: Normal appearance Spleen: Normal appearance Adrenals/Urinary Tract: Adrenal glands normal appearance. BILATERAL renal calculi. LEFT hydronephrosis and proximal hydroureter due to a 6 x 8 mm LEFT ureteral calculus image 47. No RIGHT hydronephrosis or hydroureter. Bladder unremarkable. Stomach/Bowel: Sigmoid diverticulosis without evidence of diverticulitis. Mild rectosigmoid wall thickening with edema in the perirectal and presacral fat, nonspecific, proctitis not excluded. Appendix not definitely visualized. Stomach and remaining bowel loops otherwise normal appearance. Vascular/Lymphatic: Atherosclerotic calcifications  aorta and iliac arteries without aneurysm. No adenopathy. Reproductive: Uterus surgically absent.  Ovaries not visualized. Other: Tiny LEFT inguinal hernia containing fat. No free air or free fluid. Musculoskeletal: Bones demineralized. Scattered degenerative changes lumbar spine. IMPRESSION: LEFT hydronephrosis and hydroureter due to a 6 x 8 mm mid LEFT ureteral calculus. Additional BILATERAL nonobstructing renal calculi. Sigmoid diverticulosis. Rectal wall thickening with edema in perirectal and presacral fat, nonspecific but could reflect proctitis recommend correlation with proctoscopy. Tiny LEFT inguinal hernia containing fat. Small BILATERAL pleural effusions and bibasilar atelectasis. Question anemia. Electronically Signed   By: Lavonia Dana M.D.   On: 01/11/2020 12:04    Impression/Assessment:  Obstructing left lower proximal/mid ureteral calculus with hydronephrosis.  Most likely chronic and a potential source of her recurrent sepsis  Recommendations:  -Cystoscopy with placement left ureteral stent -I discussed the procedure with her nephew Gordy Clement.  The indications, nature of the procedure and potential risks were discussed including worsening sepsis, ureteral injury and inability to place a stent due to an impacted stone.  In this event she would need a percutaneous nephrostomy.  He indicated all questions were answered and will consent for the procedure. -I was stressed no attempts will be made for stone removal due to sepsis and she will need treatment at a later date.  01/12/2020, 8:43 AM  John Giovanni,  MD

## 2020-01-12 NOTE — Transfer of Care (Signed)
Immediate Anesthesia Transfer of Care Note  Patient: Brandi Hanson  Procedure(s) Performed: CYSTOSCOPY WITH STENT PLACEMENT (Left )  Patient Location: PACU  Anesthesia Type:General  Level of Consciousness: awake and drowsy  Airway & Oxygen Therapy: Patient Spontanous Breathing and Patient connected to face mask oxygen  Post-op Assessment: Report given to RN and Post -op Vital signs reviewed and stable  Post vital signs: Reviewed and stable  Last Vitals:  Vitals Value Taken Time  BP 160/74 01/12/20 1321  Temp 36.1 C 01/12/20 1321  Pulse 69 01/12/20 1323  Resp 12 01/12/20 1323  SpO2 100 % 01/12/20 1323  Vitals shown include unvalidated device data.  Last Pain:  Vitals:   01/12/20 1321  TempSrc:   PainSc: Asleep         Complications: No apparent anesthesia complications

## 2020-01-12 NOTE — Anesthesia Procedure Notes (Signed)
Procedure Name: LMA Insertion Date/Time: 01/12/2020 12:30 PM Performed by: Allean Found, CRNA Pre-anesthesia Checklist: Patient identified, Patient being monitored, Timeout performed, Emergency Drugs available and Suction available Patient Re-evaluated:Patient Re-evaluated prior to induction Oxygen Delivery Method: Circle system utilized Preoxygenation: Pre-oxygenation with 100% oxygen Induction Type: IV induction Ventilation: Mask ventilation without difficulty LMA: LMA inserted LMA Size: 4.0 Tube type: Oral Number of attempts: 1 Placement Confirmation: positive ETCO2 and breath sounds checked- equal and bilateral Tube secured with: Tape Dental Injury: Teeth and Oropharynx as per pre-operative assessment

## 2020-01-12 NOTE — Care Management Important Message (Signed)
Important Message  Patient Details  Name: Brandi Hanson MRN: [id number] Date of Birth: 04-21-1936   Medicare Important Message Given:  Yes     [name] 01/12/2020, 12:30 PM

## 2020-01-12 NOTE — Op Note (Signed)
Preoperative diagnosis:  Left proximal ureteral calculus with hydronephrosis  Postoperative diagnosis:  Same  Procedure:  Cystoscopy Left ureteral stent placement (6FR/24 cm) Left retrograde pyelography with interpretation   Surgeon: Nicki Reaper C. , M.D.  Anesthesia: General  Complications: None  Intraoperative findings:  Left retrograde pyelogram: Mild left hydronephrosis/hydroureter to a left proximal ureteral calculus  EBL: Minimal  Specimens: Urine left renal pelvis for culture  Indication: Brandi Hanson is a 84 y.o. female with advanced dementia who was discharged 4/6 for sepsis secondary to a Klebsiella UTI.  She was readmitted 01/07/2020 after being seen in the ED for evaluation of altered mental status, fever, leukocytosis and toxic metabolic encephalopathy.  She had Pseudomonas bacteremia secondary to Pseudomonas UTI.  She has responded clinically and discharge was anticipated today however a CT of the abdomen and pelvis without contrast was performed with her history of recurrent sepsis and she was found to have a left proximal ureteral calculus with hydronephrosis.   After reviewing the management options for treatment, it was elected to proceed with the above surgical procedure(s). We have discussed the potential benefits and risks of the procedure, side effects of the proposed treatment, the likelihood of the patient achieving the goals of the procedure, and any potential problems that might occur during the procedure or recuperation. Informed consent has been obtained.  Description of procedure:  The patient was taken to the operating room and general anesthesia was induced.  The patient was placed in the dorsal lithotomy position, prepped and draped in the usual sterile fashion, and preoperative antibiotics were administered. A preoperative time-out was performed.   A 21 French cystoscope was lubricated and passed per urethra.  The bladder was then systematically  examined in its entirety. There was no evidence for any bladder tumors, stones, or other mucosal pathology.    A 0.038 sensor guidewire was then advanced up the left ureter into the renal pelvis under fluoroscopic guidance.  A 5 French open-ended ureteral catheter was placed over the wire and advanced to the renal pelvis.  Retrograde pyelogram was then performed with findings as described above.  Clear urine was aspirated and sent for culture.  The guidewire was replaced and the ureteral catheter was removed.  A 6 French/24 cm Contour ureteral stent was then advanced over the guidewire under fluoroscopic guidance.  The stent was positioned appropriately under fluoroscopic and cystoscopic guidance.  The wire was then removed with an adequate stent curl noted in the renal pelvis as well as in the bladder.  The bladder was then emptied and the procedure ended.  The patient appeared to tolerate the procedure well and without complications.  The patient was able to be awakened and transferred to the recovery unit in satisfactory condition.   Plan: She will be scheduled for definitive stone treatment in the future.  John Giovanni, MD

## 2020-01-12 NOTE — NC FL2 (Signed)
[place] LEVEL OF CARE SCREENING TOOL     IDENTIFICATION  Patient Name: Brandi Hanson Birthdate: 08/02/1936 Sex: female Admission Date (Current Location): 01/07/2020  [place] and [place] Number:  Engineering geologist and Address:  [facility], [address], [place], [place] [address]      Provider Number: [id number]  Attending Physician Name and Address:  Samuella Cota, MD  Relative Name and Phone Number:  Bethann Goo, [phone]    Current Level of Care: Hospital Recommended Level of Care: [place], Memory Care(with PT and OT through [facility]) Prior Approval Number:    Date Approved/Denied:   PASRR Number:    Discharge Plan: Other (Comment)(ALF Memory Care with PT and OT through [facility].)    Current Diagnoses: Patient Active Problem List   Diagnosis Date Noted  . Bacteremia due to Gram-negative bacteria 01/08/2020  . Severe sepsis ([place]) 12/12/2019  . Sepsis secondary to UTI ([place]) 12/12/2019  . Dementia without behavioral disturbance ([place]) 12/12/2019  . Toxic metabolic encephalopathy [id number]  . Near syncope 12/12/2019  . Aortic atherosclerosis ([place]) 01/03/2017  . Renal calculi 01/03/2017  . Allergic rhinitis 07/07/2015  . MCI (mild cognitive impairment) with memory loss 03/25/2015  . Depression, major, recurrent, mild ([place]) 03/25/2015  . Generalized anxiety disorder 03/25/2015  . IBS (irritable bowel syndrome) 02/15/2015  . At risk for falling 01/08/2015  . Body mass index (BMI) of 23.0-23.9 in adult 01/08/2015  . H/O malignant neoplasm of breast 01/08/2015  . Chronic constipation 01/08/2015  . Breast cancer of upper-outer quadrant of left female breast ([place]) 01/08/2015  . HZV (herpes zoster virus) post herpetic neuralgia 01/08/2015  . Apnea, sleep 01/08/2015  . Palpitations 08/21/2013  . Benign essential HTN 01/02/2012  . Beat, premature ventricular 04/21/2009  . Absolute anemia  02/05/2009  . Acid reflux 02/05/2009  . Essential (primary) hypertension 02/05/2009  . Bone/cartilage disorder 07/22/2007  . Avitaminosis D 10/08/2006  . Carotid artery obstruction 09/11/1998    Orientation RESPIRATION BLADDER Height & Weight     Self  Normal Incontinent Weight: 119 lb 0.8 oz (54 kg) Height:  5\' 6"  (167.6 cm)  BEHAVIORAL SYMPTOMS/MOOD NEUROLOGICAL BOWEL NUTRITION STATUS  (None) (Dementia) Incontinent Diet(DYS 3)  AMBULATORY STATUS COMMUNICATION OF NEEDS Skin   Limited Assist Verbally Surgical wounds(Vaginal Incision: No dressing.)                       Personal Care Assistance Level of Assistance  Bathing, Feeding, Dressing Bathing Assistance: Limited assistance Feeding assistance: Limited assistance Dressing Assistance: Limited assistance     Functional Limitations Info  Sight, Hearing, Speech Sight Info: Adequate Hearing Info: Adequate Speech Info: Adequate    SPECIAL CARE FACTORS FREQUENCY  PT (By licensed PT), OT (By licensed OT)     PT Frequency: 3 x week OT Frequency: 3 x week            Contractures Contractures Info: Not present    Additional Factors Info  Code Status, Allergies Code Status Info: DNR Allergies Info: Sulfa Antibiotics Psychotropic Info: sertraline (zoloft) 150mg  and Risperdol .25mg  at bedtime         Current Medications (01/12/2020):  This is the current hospital active medication list Current Facility-Administered Medications  Medication Dose Route Frequency Provider Last Rate Last Admin  . [MAR Hold] 0.9 %  sodium chloride infusion   Intravenous PRN Samuella Cota, MD 10 mL/hr at 01/12/20 1049 Restarted at 01/12/20 1252  . [  MAR Hold] acetaminophen (TYLENOL) tablet 650 mg  650 mg Oral Q6H PRN Athena Masse, MD       Or  . Doug Sou Hold] acetaminophen (TYLENOL) suppository 650 mg  650 mg Rectal Q6H PRN Athena Masse, MD      . Doug Sou Hold] ceFEPIme (MAXIPIME) 2 g in sodium chloride 0.9 % 100 mL IVPB  2 g  Intravenous Q12H Samuella Cota, MD 200 mL/hr at 01/12/20 0831 2 g at 01/12/20 0831  . [MAR Hold] cholecalciferol (VITAMIN D3) tablet 2,000 Units  2,000 Units Oral Daily Samuella Cota, MD   2,000 Units at 01/12/20 [phone]  . [MAR Hold] donepezil (ARICEPT) tablet 10 mg  10 mg Oral QHS Samuella Cota, MD   10 mg at 01/11/20 2144  . [MAR Hold] feeding supplement (BOOST / RESOURCE BREEZE) liquid 1 Container  1 Container Oral Daily Samuella Cota, MD   1 Container at 01/11/20 1050  . fentaNYL (SUBLIMAZE) injection 25 mcg  25 mcg Intravenous Q5 min PRN Gunnar Fusi, MD      . Doug Sou Hold] loratadine (CLARITIN) tablet 10 mg  10 mg Oral Daily Samuella Cota, MD   10 mg at 01/12/20 0826  . [MAR Hold] LORazepam (ATIVAN) tablet 0.25 mg  0.25 mg Oral BID PRN Samuella Cota, MD   0.25 mg at 01/10/20 1312  . [MAR Hold] memantine (NAMENDA) tablet 10 mg  10 mg Oral BID Samuella Cota, MD   10 mg at 01/12/20 0827  . [MAR Hold] ondansetron (ZOFRAN) tablet 4 mg  4 mg Oral Q6H PRN Athena Masse, MD       Or  . Doug Sou Hold] ondansetron [facility]) injection 4 mg  4 mg Intravenous Q6H PRN Athena Masse, MD      . ondansetron [facility]) injection 4 mg  4 mg Intravenous Once PRN Gunnar Fusi, MD      . Doug Sou Hold] polyvinyl alcohol (LIQUIFILM TEARS) 1.4 % ophthalmic solution 1 drop  1 drop Both Eyes PRN Samuella Cota, MD      . Doug Sou Hold] potassium chloride 10 mEq in 100 mL IVPB  10 mEq Intravenous Q1 Hr x 4 Samuella Cota, MD 100 mL/hr at 01/12/20 1051 10 mEq at 01/12/20 1051  . [MAR Hold] risperiDONE (RISPERDAL) tablet 0.25 mg  0.25 mg Oral QHS Samuella Cota, MD   0.25 mg at 01/11/20 2144  . [MAR Hold] senna-docusate (Senokot-S) tablet 1 tablet  1 tablet Oral QHS PRN Athena Masse, MD   1 tablet at 01/08/20 1540  . [MAR Hold] sertraline (ZOLOFT) tablet 150 mg  150 mg Oral Daily Samuella Cota, MD   150 mg at 01/12/20 [id number]     Discharge Medications: TAKE these  medications       acetaminophen 325 MG tablet Commonly known as: TYLENOL Take 650 mg by mouth in the morning and at bedtime. What changed: Another medication with the same name was removed. Continue taking this medication, and follow the directions you see here.   Calcium 600+D 600-800 MG-UNIT Tabs Generic drug: Calcium Carb-Cholecalciferol Take 1 tablet by mouth 2 (two) times daily.   ciprofloxacin 500 MG tablet Commonly known as: CIPRO Take 1 tablet (500 mg total) by mouth 2 (two) times daily. Last dose 5/14 in PM.   cyanocobalamin 1000 MCG tablet Take 1 tablet (1,000 mcg total) by mouth daily.   donepezil 10 MG tablet Commonly known as: ARICEPT TAKE 1 TABLET  BY MOUTH AT BEDTIME   fluticasone 50 MCG/ACT nasal spray Commonly known as: FLONASE Place 1 spray into both nostrils daily.   loratadine 10 MG tablet Commonly known as: CLARITIN Take 10 mg by mouth daily.   LORazepam 0.5 MG tablet Commonly known as: ATIVAN Take 0.5 tablets (0.25 mg total) by mouth 2 (two) times daily as needed for anxiety or sleep.   melatonin 3 MG Tabs tablet Take 3 mg by mouth at bedtime.   memantine 10 MG tablet Commonly known as: NAMENDA Take 10 mg by mouth 2 (two) times daily.   ondansetron 8 MG tablet Commonly known as: ZOFRAN Take 8 mg by mouth every 8 (eight) hours as needed for nausea or vomiting.   polyethylene glycol 17 g packet Commonly known as: MIRALAX / GLYCOLAX Take 17 g by mouth daily.   propranolol 20 MG tablet Commonly known as: INDERAL Take 1 tablet (20 mg total) by mouth 2 (two) times daily.   risperiDONE 0.25 MG tablet Commonly known as: RISPERDAL Take 0.25 mg by mouth at bedtime.   sertraline 100 MG tablet Commonly known as: ZOLOFT Take 150 mg by mouth daily.   SYSTANE COMPLETE OP Place 1 drop into both eyes in the morning and at bedtime.   Vitamin D3 25 MCG tablet Commonly known as: Vitamin D Take 2 tablets (2,000 Units total) by mouth  daily.     Relevant Imaging Results:  Relevant Lab Results:   Additional Information SS#: [id number]  Candie Chroman, LCSW

## 2020-01-13 LAB — BASIC METABOLIC PANEL
Anion gap: 6 (ref 5–15)
BUN: 16 mg/dL (ref 8–23)
CO2: 27 mmol/L (ref 22–32)
Calcium: 8.8 mg/dL — ABNORMAL LOW (ref 8.9–10.3)
Chloride: 106 mmol/L (ref 98–111)
Creatinine, Ser: 0.49 mg/dL (ref 0.44–1.00)
GFR calc Af Amer: >60 mL/min (ref >60)
GFR calc non Af Amer: >60 mL/min (ref >60)
Glucose, Bld: 95 mg/dL (ref 70–99)
Potassium: 3.8 mmol/L (ref 3.5–5.1)
Sodium: 139 mmol/L (ref 135–145)

## 2020-01-13 MED ORDER — CIPROFLOXACIN HCL 500 MG PO TABS: 500.0000 mg | ORAL_TABLET | Freq: Two times a day (BID) | ORAL | 0 refills | Status: DC

## 2020-01-13 MED ORDER — CIPROFLOXACIN HCL 500 MG PO TABS: 500.0000 mg | ORAL_TABLET | Freq: Two times a day (BID) | ORAL | Status: DC

## 2020-01-13 MED ORDER — LORAZEPAM 0.5 MG PO TABS: 0.2500 mg | ORAL_TABLET | Freq: Two times a day (BID) | ORAL | 0 refills | Status: AC | PRN

## 2020-01-13 NOTE — Plan of Care (Signed)
The patient has been discharged to [facility]. The patient was ambulated to the stretcher. Nephew Brandi Hanson has been updated that the patient has left to facility. IV removed. Vitals stables upon discharge.  Problem: Education: Goal: Knowledge of General Education information will improve Description: Including pain rating scale, medication(s)/side effects and non-pharmacologic comfort measures Outcome: Completed/Met   Problem: Health Behavior/Discharge Planning: Goal: Ability to manage health-related needs will improve Outcome: Completed/Met   Problem: Clinical Measurements: Goal: Ability to maintain clinical measurements within normal limits will improve Outcome: Completed/Met Goal: Will remain free from infection Outcome: Completed/Met Goal: Diagnostic test results will improve Outcome: Completed/Met Goal: Respiratory complications will improve Outcome: Completed/Met Goal: Cardiovascular complication will be avoided Outcome: Completed/Met   Problem: Activity: Goal: Risk for activity intolerance will decrease Outcome: Completed/Met   Problem: Nutrition: Goal: Adequate nutrition will be maintained Outcome: Completed/Met   Problem: Coping: Goal: Level of anxiety will decrease Outcome: Completed/Met   Problem: Elimination: Goal: Will not experience complications related to bowel motility Outcome: Completed/Met Goal: Will not experience complications related to urinary retention Outcome: Completed/Met

## 2020-01-13 NOTE — TOC Transition Note (Addendum)
Transition of Care [facility]) - CM/SW Discharge Note   Patient Details  Name: Brandi Hanson MRN: [id number] Date of Birth: 04/10/1936  Transition of [facility]) CM/SW Contact:  Candie Chroman, LCSW Phone Number: 01/13/2020, 12:34 PM   Clinical Narrative:  Patient has orders to discharge back to [facility] today. Nurse notified patient's nephew over the phone while CSW was present. Signed FL2 faxed to [place] at [facility] MD has signed Ativan prescription as requested. RN will call report to [phone]. EMS transport has been set up. Patient is 4th on the list. Nurse will call nephew when EMS arrives to pick her up. No further concerns. CSW signing off.   Final next level of care: Memory Care Barriers to Discharge: Barriers Resolved   Patient Goals and CMS Choice        Discharge Placement                Patient to be transferred to facility by: EMS Name of family member notified: Gordy Clement Patient and family notified of of transfer: 01/13/20  Discharge Plan and Services                DME Arranged: Youth worker wheelchair with seat cushion DME Agency: [employer] Date DME Agency Contacted: 01/12/20  Representative spoke with at DME Agency: Juliann Pulse    HH Arranged: PT, OT [place] Agency: [facility] [place] Date [place]: 01/13/20   Representative spoke with at [place]: [place] Interventions     Readmission Risk Interventions No flowsheet data found.

## 2020-01-13 NOTE — TOC Progression Note (Signed)
Transition of Care [facility]) - Progression Note    Patient Details  Name: Brandi Hanson MRN: [id number] Date of Birth: [birth year]  Transition of Care [facility]) CM/SW [place], LCSW Phone Number: 01/13/2020, 11:02 AM  Clinical Narrative: Tawni Carnes and discharge summary to [facility] at [facility]. Asked her to call back once reviewed to determine if any changes need to be made or if the MD can go ahead and cosign the FL2.     Expected Discharge Plan: Assisted Living(Memory Care with home health.) Barriers to Discharge: Continued Medical Work up  Expected Discharge Plan and Services Expected Discharge Plan: Assisted Living(Memory Care with home health.)       Living arrangements for the past 2 months: [facility] Expected Discharge Date: 01/13/20                                     Social Determinants of Health (SDOH) Interventions    Readmission Risk Interventions No flowsheet data found.

## 2020-01-15 NOTE — Anesthesia Postprocedure Evaluation (Signed)
Anesthesia Post Note  Patient: Brandi Hanson  Procedure(s) Performed: CYSTOSCOPY WITH STENT PLACEMENT (Left )  Patient location during evaluation: PACU Anesthesia Type: General Level of consciousness: awake and alert Pain management: pain level controlled Vital Signs Assessment: post-procedure vital signs reviewed and stable Respiratory status: spontaneous breathing, nonlabored ventilation and respiratory function stable Cardiovascular status: blood pressure returned to baseline and stable Postop Assessment: no apparent nausea or vomiting Anesthetic complications: no     Last Vitals:  Vitals:   01/13/20 0623 01/13/20 1128  BP: 133/84 115/69  Pulse: 76 79  Resp: 20   Temp: 36.6 C 36.5 C  SpO2: 98% 97%    Last Pain:  Vitals:   01/13/20 0623  TempSrc: Oral  PainSc:                  [name]

## 2020-01-17 LAB — URINE CULTURE: Culture: 100 — AB

## 2020-01-27 ENCOUNTER — Other Ambulatory Visit: Payer: Self-pay | Admitting: *Deleted

## 2020-01-27 ENCOUNTER — Other Ambulatory Visit: Payer: Self-pay | Admitting: Radiology

## 2020-01-27 ENCOUNTER — Other Ambulatory Visit: Payer: Self-pay

## 2020-01-27 ENCOUNTER — Ambulatory Visit (INDEPENDENT_AMBULATORY_CARE_PROVIDER_SITE_OTHER): Payer: Medicare PPO | Admitting: Physician Assistant

## 2020-01-27 ENCOUNTER — Encounter: Payer: Self-pay | Admitting: Physician Assistant

## 2020-01-27 ENCOUNTER — Ambulatory Visit
Admission: RE | Admit: 2020-01-27 | Discharge: 2020-01-27 | Disposition: A | Discharge status: Home / Self Care | Payer: Medicare PPO | Source: Ambulatory Visit | Attending: Physician Assistant | Admitting: Physician Assistant

## 2020-01-27 VITALS — BP 110/80 | HR 79 | Ht 65.0 in | Wt 119.0 lb

## 2020-01-27 DIAGNOSIS — N201 Calculus of ureter: Secondary | ICD-10-CM

## 2020-01-27 DIAGNOSIS — N200 Calculus of kidney: Secondary | ICD-10-CM

## 2020-01-27 NOTE — H&P (View-Only) (Signed)
01/27/2020 11:04 AM   Brandi Hanson Dec 10, 1935 [id number]  CC: Chief Complaint  Patient presents with  . Nephrolithiasis    HPI: Brandi Hanson is a 84 y.o. female with advanced dementia and a recent history of recurrent urosepsis and a 6 x 8 mm mid left ureteral calculus s/p left ureteral stent placement with Dr. Bernardo Heater on 01/12/2020 who presents today for outpatient follow-up.  She is accompanied today by her sister, who contributes to HPI.  She was hospitalized most recently from 01/07/2020 to 01/13/2020 with urosepsis; admission urine and blood cultures with pansensitive Pseudomonas aeruginosa.  Intraoperative urine culture grew erythromycin, oxacillin, and Bactrim resistant Staph hominis.  She was treated with cefepime during her hospitalization and discharged on ciprofloxacin x10 days.  CT stone study dated 01/11/2020 revealed bilateral nonobstructing nephrolithiasis in addition to the previously noted left ureteral stone.  She has a history of heart palpitations and PVCs, previously seen by a cardiologist in [place] but cleared by them several years ago. She does not have a history of pulmonary problems. [facility] PCP is [facility].  KUB today reveals an appropriately placed left ureteral stent with radiopaque mid left ureteral stone.  Of note, patient's sister reports concern regarding leaving a ureteral stent on a tether due to her dementia and possible poor risk.  In-office UA today positive for 3+ blood, 1+ protein, and 1+ leukocyte esterase; urine microscopy with >30 RBCs/HPF.  PMH: Past Medical History:  Diagnosis Date  . Abdominal distention   . Abdominal pain   . Anxiety   . Arthritis   . Bacteremia due to Gram-negative bacteria 01/08/2020  . Benign essential HTN 01/02/2012  . Breast cancer in situ 07/22/2011  . Cancer ([place])    skin  . Confusion   . Depression   . Generalized headaches   . GERD (gastroesophageal reflux disease)   . Hyperlipidemia   .  Hyperlipidemia 01/02/2012  . Hypertension   . Memory loss   . Osteoporosis   . Palpitations   . PVC (premature ventricular contraction)   . Rectal pain   . Sleep apnea     Surgical History: Past Surgical History:  Procedure Laterality Date  . ABDOMINAL HYSTERECTOMY  1978  . [place]   left  . breast cancer  10/2010   ductal carcinoma in situ, left breast  . CATARACT EXTRACTION  2011   bilateral  . CYSTOSCOPY WITH STENT PLACEMENT Left 01/12/2020   Procedure: CYSTOSCOPY WITH STENT PLACEMENT;  Surgeon: Abbie Sons, MD;  Location: [facility] ORS;  Service: Urology;  Laterality: Left;  . FOOT SURGERY  2005  . KIDNEY STONE SURGERY  2011  . ROTATOR CUFF REPAIR  2007    Home Medications:  Allergies as of 01/27/2020      Reactions   Sulfa Antibiotics Nausea Only      Medication List       Accurate as of Jan 27, 2020 11:04 AM. If you have any questions, ask your nurse or doctor.        acetaminophen 325 MG tablet Commonly known as: TYLENOL Take 650 mg by mouth in the morning and at bedtime.   Calcium 600+D 600-800 MG-UNIT Tabs Generic drug: Calcium Carb-Cholecalciferol Take 1 tablet by mouth 2 (two) times daily.   ciprofloxacin 500 MG tablet Commonly known as: CIPRO Take 1 tablet (500 mg total) by mouth 2 (two) times daily. Last dose 5/14 in PM.   cyanocobalamin 1000 MCG tablet Take 1 tablet (1,000  mcg total) by mouth daily.   donepezil 10 MG tablet Commonly known as: ARICEPT TAKE 1 TABLET BY MOUTH AT BEDTIME   fluticasone 50 MCG/ACT nasal spray Commonly known as: FLONASE Place 1 spray into both nostrils daily.   loratadine 10 MG tablet Commonly known as: CLARITIN Take 10 mg by mouth daily.   LORazepam 0.5 MG tablet Commonly known as: ATIVAN Take 0.5 tablets (0.25 mg total) by mouth 2 (two) times daily as needed for anxiety or sleep.   melatonin 3 MG Tabs tablet Take 3 mg by mouth at bedtime.   memantine 10 MG tablet Commonly known as:  NAMENDA Take 10 mg by mouth 2 (two) times daily.   ondansetron 8 MG tablet Commonly known as: ZOFRAN Take 8 mg by mouth every 8 (eight) hours as needed for nausea or vomiting.   polyethylene glycol 17 g packet Commonly known as: MIRALAX / GLYCOLAX Take 17 g by mouth daily.   propranolol 20 MG tablet Commonly known as: INDERAL Take 1 tablet (20 mg total) by mouth 2 (two) times daily.   risperiDONE 0.25 MG tablet Commonly known as: RISPERDAL Take 0.25 mg by mouth at bedtime.   sertraline 100 MG tablet Commonly known as: ZOLOFT Take 150 mg by mouth daily.   SYSTANE COMPLETE OP Place 1 drop into both eyes in the morning and at bedtime.   Vitamin D3 25 MCG tablet Commonly known as: Vitamin D Take 2 tablets (2,000 Units total) by mouth daily.       Allergies:  Allergies  Allergen Reactions  . Sulfa Antibiotics Nausea Only    Family History: Family History  Problem Relation Age of Onset  . Cancer Mother        breast  . Crohn's disease Mother   . Cancer Father        lung  . Hypertension Father   . Cancer Sister        breast and uterine  . Hypertension Sister   . Heart disease Maternal Grandmother   . Cancer Maternal Grandfather        colon  . Hypertension Maternal Grandfather   . Heart disease Paternal Grandmother   . Heart disease Paternal Grandfather     Social History:   reports that she has never smoked. She has never used smokeless tobacco. She reports that she does not drink alcohol or use drugs.  Physical Exam: BP 110/80   Pulse 79   Ht 5\' 5"  (1.651 m)   Wt 119 lb (54 kg)   BMI 19.80 kg/m   Constitutional:  Alert, no acute distress, nontoxic appearing HEENT: [place], AT Cardiovascular: No clubbing, cyanosis, or edema Respiratory: Normal respiratory effort, no increased work of breathing Skin: No rashes, bruises or suspicious lesions Neurologic: Grossly intact, no focal deficits, moving all 4 extremities Psychiatric: Normal mood and  affect  Laboratory Data: Results for orders placed or performed in visit on 01/27/20  Microscopic Examination   URINE  Result Value Ref Range   WBC, UA 0-5 0 - 5 /hpf   RBC >30 (A) 0 - 2 /hpf   Epithelial Cells (non renal) 0-10 0 - 10 /hpf   Bacteria, UA None seen None seen/Few  Urinalysis, Complete  Result Value Ref Range   Specific Gravity, UA 1.020 1.005 - 1.030   pH, UA 7.0 5.0 - 7.5   Color, UA Yellow Yellow   Appearance Ur Cloudy (A) Clear   Leukocytes,UA 1+ (A) Negative   Protein,UA 1+ (A) Negative/Trace  Glucose, UA Negative Negative   Ketones, UA Negative Negative   RBC, UA 3+ (A) Negative   Bilirubin, UA Negative Negative   Urobilinogen, Ur 0.2 0.2 - 1.0 mg/dL   Nitrite, UA Negative Negative   Microscopic Examination See below:    Pertinent Imaging: KUB, 01/27/2020: CLINICAL DATA:  Kidney stone.  EXAM: ABDOMEN - 1 VIEW  COMPARISON:  Jan 11, 2020.  FINDINGS: The bowel gas pattern is normal. Left ureteral stent is noted in grossly good position. Several calculi are seen projected over lower pole of right kidney consistent with nephrolithiasis.  IMPRESSION: Left ureteral stent is noted in grossly good position. Right nephrolithiasis is noted.   Electronically Signed   By: Marijo Conception M.D.   On: 01/27/2020 15:47   I personally reviewed the images referenced above and note an appropriately placed left ureteral stent with right nephrolithiasis and a mid left ureteral stone.  Assessment & Plan:   1. Left ureteral stone 84 year old female with a recent history of recurrent urosepsis as well as a mid left ureteral stone and nonobstructing bilateral nephrolithiasis presents for outpatient follow-up s/p left ureteral stent placement with Dr. Bernardo Heater.  We discussed various treatment options for her ureteral stone including ESWL vs. ureteroscopy with laser lithotripsy and stent exchange. We discussed the risks and benefits of each including bleeding,  infection, damage to surrounding structures, efficacy with need for possible further intervention, and need for temporary ureteral stent.  Based on this conversation, patient and her sister agree that they would like to proceed with ureteroscopy.  We will plan for this on 02/10/2020.  Recommend not leaving stent on tether due to patient's history of dementia, will plan for outpatient cystoscopy stent removal following surgery.  We will send urine for culture today, though UA is reassuring for infection.  No medical clearance indicated in advance of surgery. - Urinalysis, Complete - CULTURE, URINE COMPREHENSIVE   Debroah Loop, PA-C  [facility] [address], [place], [place] [address] [phone]

## 2020-01-27 NOTE — Progress Notes (Signed)
01/27/2020 11:04 AM   Brandi Hanson 09-Dec-1935 [id number]  CC: Chief Complaint  Patient presents with  . Nephrolithiasis    HPI: Brandi Hanson is a 84 y.o. female with advanced dementia and a recent history of recurrent urosepsis and a 6 x 8 mm mid left ureteral calculus s/p left ureteral stent placement with Dr. Bernardo Heater on 01/12/2020 who presents today for outpatient follow-up.  She is accompanied today by her sister, who contributes to HPI.  She was hospitalized most recently from 01/07/2020 to 01/13/2020 with urosepsis; admission urine and blood cultures with pansensitive Pseudomonas aeruginosa.  Intraoperative urine culture grew erythromycin, oxacillin, and Bactrim resistant Staph hominis.  She was treated with cefepime during her hospitalization and discharged on ciprofloxacin x10 days.  CT stone study dated 01/11/2020 revealed bilateral nonobstructing nephrolithiasis in addition to the previously noted left ureteral stone.  She has a history of heart palpitations and PVCs, previously seen by a cardiologist in [place] but cleared by them several years ago. She does not have a history of pulmonary problems. Her PCP is [facility].  KUB today reveals an appropriately placed left ureteral stent with radiopaque mid left ureteral stone.  Of note, patient's sister reports concern regarding leaving a ureteral stent on a tether due to her dementia and possible poor risk.  In-office UA today positive for 3+ blood, 1+ protein, and 1+ leukocyte esterase; urine microscopy with >30 RBCs/HPF.  PMH: Past Medical History:  Diagnosis Date  . Abdominal distention   . Abdominal pain   . Anxiety   . Arthritis   . Bacteremia due to Gram-negative bacteria 01/08/2020  . Benign essential HTN 01/02/2012  . Breast cancer in situ 07/22/2011  . Cancer ([place])    skin  . Confusion   . Depression   . Generalized headaches   . GERD (gastroesophageal reflux disease)   . Hyperlipidemia   .  Hyperlipidemia 01/02/2012  . Hypertension   . Memory loss   . Osteoporosis   . Palpitations   . PVC (premature ventricular contraction)   . Rectal pain   . Sleep apnea     Surgical History: Past Surgical History:  Procedure Laterality Date  . ABDOMINAL HYSTERECTOMY  1978  . [place]   left  . breast cancer  10/2010   ductal carcinoma in situ, left breast  . CATARACT EXTRACTION  2011   bilateral  . CYSTOSCOPY WITH STENT PLACEMENT Left 01/12/2020   Procedure: CYSTOSCOPY WITH STENT PLACEMENT;  Surgeon: Abbie Sons, MD;  Location: [facility] ORS;  Service: Urology;  Laterality: Left;  . FOOT SURGERY  2005  . KIDNEY STONE SURGERY  2011  . ROTATOR CUFF REPAIR  2007    Home Medications:  Allergies as of 01/27/2020      Reactions   Sulfa Antibiotics Nausea Only      Medication List       Accurate as of Jan 27, 2020 11:04 AM. If you have any questions, ask your nurse or doctor.        acetaminophen 325 MG tablet Commonly known as: TYLENOL Take 650 mg by mouth in the morning and at bedtime.   Calcium 600+D 600-800 MG-UNIT Tabs Generic drug: Calcium Carb-Cholecalciferol Take 1 tablet by mouth 2 (two) times daily.   ciprofloxacin 500 MG tablet Commonly known as: CIPRO Take 1 tablet (500 mg total) by mouth 2 (two) times daily. Last dose 5/14 in PM.   cyanocobalamin 1000 MCG tablet Take 1 tablet (1,000  mcg total) by mouth daily.   donepezil 10 MG tablet Commonly known as: ARICEPT TAKE 1 TABLET BY MOUTH AT BEDTIME   fluticasone 50 MCG/ACT nasal spray Commonly known as: FLONASE Place 1 spray into both nostrils daily.   loratadine 10 MG tablet Commonly known as: CLARITIN Take 10 mg by mouth daily.   LORazepam 0.5 MG tablet Commonly known as: ATIVAN Take 0.5 tablets (0.25 mg total) by mouth 2 (two) times daily as needed for anxiety or sleep.   melatonin 3 MG Tabs tablet Take 3 mg by mouth at bedtime.   memantine 10 MG tablet Commonly known as:  NAMENDA Take 10 mg by mouth 2 (two) times daily.   ondansetron 8 MG tablet Commonly known as: ZOFRAN Take 8 mg by mouth every 8 (eight) hours as needed for nausea or vomiting.   polyethylene glycol 17 g packet Commonly known as: MIRALAX / GLYCOLAX Take 17 g by mouth daily.   propranolol 20 MG tablet Commonly known as: INDERAL Take 1 tablet (20 mg total) by mouth 2 (two) times daily.   risperiDONE 0.25 MG tablet Commonly known as: RISPERDAL Take 0.25 mg by mouth at bedtime.   sertraline 100 MG tablet Commonly known as: ZOLOFT Take 150 mg by mouth daily.   SYSTANE COMPLETE OP Place 1 drop into both eyes in the morning and at bedtime.   Vitamin D3 25 MCG tablet Commonly known as: Vitamin D Take 2 tablets (2,000 Units total) by mouth daily.       Allergies:  Allergies  Allergen Reactions  . Sulfa Antibiotics Nausea Only    Family History: Family History  Problem Relation Age of Onset  . Cancer Mother        breast  . Crohn's disease Mother   . Cancer Father        lung  . Hypertension Father   . Cancer Sister        breast and uterine  . Hypertension Sister   . Heart disease Maternal Grandmother   . Cancer Maternal Grandfather        colon  . Hypertension Maternal Grandfather   . Heart disease Paternal Grandmother   . Heart disease Paternal Grandfather     Social History:   reports that she has never smoked. She has never used smokeless tobacco. She reports that she does not drink alcohol or use drugs.  Physical Exam: BP 110/80   Pulse 79   Ht 5\' 5"  (1.651 m)   Wt 119 lb (54 kg)   BMI 19.80 kg/m   Constitutional:  Alert, no acute distress, nontoxic appearing HEENT: [place], AT Cardiovascular: No clubbing, cyanosis, or edema Respiratory: Normal respiratory effort, no increased work of breathing Skin: No rashes, bruises or suspicious lesions Neurologic: Grossly intact, no focal deficits, moving all 4 extremities Psychiatric: Normal mood and  affect  Laboratory Data: Results for orders placed or performed in visit on 01/27/20  Microscopic Examination   URINE  Result Value Ref Range   WBC, UA 0-5 0 - 5 /hpf   RBC >30 (A) 0 - 2 /hpf   Epithelial Cells (non renal) 0-10 0 - 10 /hpf   Bacteria, UA None seen None seen/Few  Urinalysis, Complete  Result Value Ref Range   Specific Gravity, UA 1.020 1.005 - 1.030   pH, UA 7.0 5.0 - 7.5   Color, UA Yellow Yellow   Appearance Ur Cloudy (A) Clear   Leukocytes,UA 1+ (A) Negative   Protein,UA 1+ (A) Negative/Trace  Glucose, UA Negative Negative   Ketones, UA Negative Negative   RBC, UA 3+ (A) Negative   Bilirubin, UA Negative Negative   Urobilinogen, Ur 0.2 0.2 - 1.0 mg/dL   Nitrite, UA Negative Negative   Microscopic Examination See below:    Pertinent Imaging: KUB, 01/27/2020: CLINICAL DATA:  Kidney stone.  EXAM: ABDOMEN - 1 VIEW  COMPARISON:  Jan 11, 2020.  FINDINGS: The bowel gas pattern is normal. Left ureteral stent is noted in grossly good position. Several calculi are seen projected over lower pole of right kidney consistent with nephrolithiasis.  IMPRESSION: Left ureteral stent is noted in grossly good position. Right nephrolithiasis is noted.   Electronically Signed   By: Marijo Conception M.D.   On: 01/27/2020 15:47   I personally reviewed the images referenced above and note an appropriately placed left ureteral stent with right nephrolithiasis and a mid left ureteral stone.  Assessment & Plan:   1. Left ureteral stone 84 year old female with a recent history of recurrent urosepsis as well as a mid left ureteral stone and nonobstructing bilateral nephrolithiasis presents for outpatient follow-up s/p left ureteral stent placement with Dr. Bernardo Heater.  We discussed various treatment options for her ureteral stone including ESWL vs. ureteroscopy with laser lithotripsy and stent exchange. We discussed the risks and benefits of each including bleeding,  infection, damage to surrounding structures, efficacy with need for possible further intervention, and need for temporary ureteral stent.  Based on this conversation, patient and her sister agree that they would like to proceed with ureteroscopy.  We will plan for this on 02/10/2020.  Recommend not leaving stent on tether due to patient's history of dementia, will plan for outpatient cystoscopy stent removal following surgery.  We will send urine for culture today, though UA is reassuring for infection.  No medical clearance indicated in advance of surgery. - Urinalysis, Complete - CULTURE, URINE COMPREHENSIVE   Debroah Loop, PA-C  [facility] [address], [place], [place] [address] [phone]

## 2020-01-28 LAB — URINALYSIS, COMPLETE
Bilirubin, UA: NEGATIVE
Glucose, UA: NEGATIVE
Ketones, UA: NEGATIVE
Nitrite, UA: NEGATIVE
Specific Gravity, UA: 1.02 (ref 1.005–1.030)
Urobilinogen, Ur: 0.2 mg/dL (ref 0.2–1.0)
pH, UA: 7 (ref 5.0–7.5)

## 2020-01-28 LAB — MICROSCOPIC EXAMINATION
Bacteria, UA: NONE SEEN
RBC, Urine: >30 /hpf — AB (ref 0–2)

## 2020-01-30 LAB — CULTURE, URINE COMPREHENSIVE

## 2020-02-03 ENCOUNTER — Other Ambulatory Visit: Payer: Self-pay

## 2020-02-03 ENCOUNTER — Encounter
Admission: RE | Admit: 2020-02-03 | Discharge: 2020-02-03 | Disposition: A | Discharge status: Home / Self Care | Payer: Medicare PPO | Source: Ambulatory Visit | Attending: Urology | Admitting: Urology

## 2020-02-03 NOTE — Pre-Procedure Instructions (Signed)
The patient's sister Wynona Canes is her Health Care POA. I called her to touch base with her after talking with Ellison Hughs at [facility]. [name] affirmed that [facility] has a copy of the [facility] POA on file. [name] will meet [name] transportation in the [place] on the patient's surgery date to accompany the patient to the pre-op area.

## 2020-02-03 NOTE — Patient Instructions (Addendum)
Your procedure is scheduled on: Tuesday 02/10/20.  Report to DAY SURGERY DEPARTMENT LOCATED ON 2ND FLOOR MEDICAL MALL ENTRANCE. YOUR ARRIVAL TIME ON Tuesday 02/10/20 WILL BE 10:30AM.   Remember: Instructions that are not followed completely may result in serious medical risk, up to and including death, or upon the discretion of your surgeon and anesthesiologist your surgery may need to be rescheduled.      _X__ 1. Do not eat food after midnight the night before your procedure.                 No gum chewing or hard candies. You may drink clear liquids up to 2 hours                 before you are scheduled to arrive for your surgery- DO NOT drink clear                 liquids within 2 hours of the start of your surgery.                 Clear Liquids include:  water, apple juice without pulp, clear carbohydrate                 drink such as Clearfast or Gatorade, Black Coffee or Tea (Do not add                 anything to coffee or tea).   __X__2.  On the morning of surgery brush your teeth with toothpaste and water, you may rinse your mouth with mouthwash if you wish.  Do not swallow any toothpaste or mouthwash.      _X__ 3.  No Alcohol for 24 hours before or after surgery.    _X__ 4.  Do Not Smoke or use e-cigarettes For 24 Hours Prior to Your Surgery.                 Do not use any chewable tobacco products for at least 6 hours prior to                 surgery.  __X__5.  Notify your doctor if there is any change in your medical condition      (cold, fever, infections).      Do not wear jewelry, make-up, hairpins, clips or nail polish. Do not wear lotions, powders, or perfumes.  Do not shave 48 hours prior to surgery. Men may shave face and neck. Do not bring valuables to the hospital.     [facility] is not responsible for any belongings or valuables.  Contacts, dentures/partials or body piercings may not be worn into surgery. Bring a case for your contacts, glasses or hearing  aids, a denture cup will be supplied.    Patients discharged the day of surgery will not be allowed to drive home.     __X__ Take these medicines the morning of surgery with A SIP OF WATER:     1. fluticasone (FLONASE)   2. loratadine (CLARITIN)   3. memantine (NAMENDA)   4. propranolol (INDERAL)   5. sertraline (ZOLOFT)   6. Propylene Glycol (SYSTANE COMPLETE OP)     __X__ Stop Anti-inflammatories 7 days before surgery such as Advil, Ibuprofen, Motrin, BC or Goodies Powder, Naprosyn, Naproxen, Aleve, Aspirin, Meloxicam. May take Tylenol if needed for pain or discomfort.     __X__ Don't start any new herbal supplements or vitamins prior to your procedure.

## 2020-02-04 ENCOUNTER — Other Ambulatory Visit: Payer: Self-pay

## 2020-02-04 ENCOUNTER — Other Ambulatory Visit: Payer: Medicare PPO

## 2020-02-04 DIAGNOSIS — N201 Calculus of ureter: Secondary | ICD-10-CM

## 2020-02-06 ENCOUNTER — Other Ambulatory Visit
Admission: RE | Admit: 2020-02-06 | Discharge: 2020-02-06 | Disposition: A | Discharge status: Home / Self Care | Payer: Medicare PPO | Source: Ambulatory Visit | Attending: Urology | Admitting: Urology

## 2020-02-06 DIAGNOSIS — Z20822 Contact with and (suspected) exposure to covid-19: Secondary | ICD-10-CM | POA: Insufficient documentation

## 2020-02-06 DIAGNOSIS — Z01812 Encounter for preprocedural laboratory examination: Secondary | ICD-10-CM | POA: Diagnosis present

## 2020-02-07 LAB — SARS CORONAVIRUS 2 (TAT 6-24 HRS): SARS Coronavirus 2: NEGATIVE

## 2020-02-07 LAB — CULTURE, URINE COMPREHENSIVE

## 2020-02-10 ENCOUNTER — Ambulatory Visit: Payer: Medicare PPO

## 2020-02-10 ENCOUNTER — Other Ambulatory Visit: Payer: Self-pay

## 2020-02-10 ENCOUNTER — Encounter
Admission: RE | Disposition: A | Discharge status: Home / Self Care | Payer: Self-pay | Source: Home / Self Care | Attending: Urology

## 2020-02-10 ENCOUNTER — Encounter: Payer: Self-pay | Admitting: Urology

## 2020-02-10 ENCOUNTER — Telehealth: Payer: Self-pay | Admitting: Urology

## 2020-02-10 ENCOUNTER — Ambulatory Visit
Admission: RE | Admit: 2020-02-10 | Discharge: 2020-02-10 | Disposition: A | Discharge status: Home / Self Care | Payer: Medicare PPO | Attending: Urology | Admitting: Urology

## 2020-02-10 DIAGNOSIS — Z85828 Personal history of other malignant neoplasm of skin: Secondary | ICD-10-CM | POA: Insufficient documentation

## 2020-02-10 DIAGNOSIS — F329 Major depressive disorder, single episode, unspecified: Secondary | ICD-10-CM | POA: Insufficient documentation

## 2020-02-10 DIAGNOSIS — N201 Calculus of ureter: Secondary | ICD-10-CM | POA: Diagnosis not present

## 2020-02-10 DIAGNOSIS — I10 Essential (primary) hypertension: Secondary | ICD-10-CM | POA: Diagnosis not present

## 2020-02-10 DIAGNOSIS — N132 Hydronephrosis with renal and ureteral calculous obstruction: Secondary | ICD-10-CM | POA: Insufficient documentation

## 2020-02-10 DIAGNOSIS — N200 Calculus of kidney: Secondary | ICD-10-CM

## 2020-02-10 DIAGNOSIS — Z853 Personal history of malignant neoplasm of breast: Secondary | ICD-10-CM | POA: Diagnosis not present

## 2020-02-10 DIAGNOSIS — M810 Age-related osteoporosis without current pathological fracture: Secondary | ICD-10-CM | POA: Insufficient documentation

## 2020-02-10 DIAGNOSIS — Z79899 Other long term (current) drug therapy: Secondary | ICD-10-CM | POA: Diagnosis not present

## 2020-02-10 DIAGNOSIS — F0390 Unspecified dementia without behavioral disturbance: Secondary | ICD-10-CM | POA: Diagnosis not present

## 2020-02-10 DIAGNOSIS — Z882 Allergy status to sulfonamides status: Secondary | ICD-10-CM | POA: Insufficient documentation

## 2020-02-10 DIAGNOSIS — G4730 Sleep apnea, unspecified: Secondary | ICD-10-CM | POA: Diagnosis not present

## 2020-02-10 DIAGNOSIS — K219 Gastro-esophageal reflux disease without esophagitis: Secondary | ICD-10-CM | POA: Diagnosis not present

## 2020-02-10 DIAGNOSIS — F419 Anxiety disorder, unspecified: Secondary | ICD-10-CM | POA: Insufficient documentation

## 2020-02-10 HISTORY — PX: CYSTOSCOPY W/ RETROGRADES: SHX1426

## 2020-02-10 HISTORY — PX: CYSTOSCOPY/URETEROSCOPY/HOLMIUM LASER/STENT PLACEMENT: SHX6546

## 2020-02-10 LAB — CBC
HCT: 33.8 % — ABNORMAL LOW (ref 36.0–46.0)
Hemoglobin: 10.8 g/dL — ABNORMAL LOW (ref 12.0–15.0)
MCH: 28.5 pg (ref 26.0–34.0)
MCHC: 32 g/dL (ref 30.0–36.0)
MCV: 89.2 fL (ref 80.0–100.0)
Platelets: 189 10*3/uL (ref 150–400)
RBC: 3.79 MIL/uL — ABNORMAL LOW (ref 3.87–5.11)
RDW: 14.7 % (ref 11.5–15.5)
WBC: 6.8 10*3/uL (ref 4.0–10.5)
nRBC: 0 % (ref 0.0–0.2)

## 2020-02-10 LAB — BASIC METABOLIC PANEL
Anion gap: 10 (ref 5–15)
BUN: 16 mg/dL (ref 8–23)
CO2: 29 mmol/L (ref 22–32)
Calcium: 9 mg/dL (ref 8.9–10.3)
Chloride: 101 mmol/L (ref 98–111)
Creatinine, Ser: 0.66 mg/dL (ref 0.44–1.00)
GFR calc Af Amer: >60 mL/min (ref >60)
GFR calc non Af Amer: >60 mL/min (ref >60)
Glucose, Bld: 93 mg/dL (ref 70–99)
Potassium: 4 mmol/L (ref 3.5–5.1)
Sodium: 140 mmol/L (ref 135–145)

## 2020-02-10 SURGERY — CYSTOSCOPY/URETEROSCOPY/HOLMIUM LASER/STENT PLACEMENT
Anesthesia: General | Site: Ureter | Laterality: Left

## 2020-02-10 MED ORDER — PROPOFOL 10 MG/ML IV BOLUS
INTRAVENOUS | Status: DC | PRN
  Administered 2020-02-10: 70 mg via INTRAVENOUS

## 2020-02-10 MED ORDER — CIPROFLOXACIN IN D5W 400 MG/200ML IV SOLN
INTRAVENOUS | Status: AC
  Filled 2020-02-10: qty 200

## 2020-02-10 MED ORDER — CIPROFLOXACIN IN D5W 400 MG/200ML IV SOLN
400.0000 mg | INTRAVENOUS | Status: AC
  Administered 2020-02-10: 400 mg via INTRAVENOUS

## 2020-02-10 MED ORDER — FENTANYL CITRATE (PF) 100 MCG/2ML IJ SOLN
INTRAMUSCULAR | Status: AC
  Filled 2020-02-10: qty 2

## 2020-02-10 MED ORDER — CHLORHEXIDINE GLUCONATE 0.12 % MT SOLN
OROMUCOSAL | Status: AC
  Filled 2020-02-10: qty 15

## 2020-02-10 MED ORDER — FENTANYL CITRATE (PF) 100 MCG/2ML IJ SOLN
INTRAMUSCULAR | Status: DC | PRN
  Administered 2020-02-10: 50 ug via INTRAVENOUS

## 2020-02-10 MED ORDER — ONDANSETRON HCL 4 MG/2ML IJ SOLN
INTRAMUSCULAR | Status: AC
  Filled 2020-02-10: qty 2

## 2020-02-10 MED ORDER — ONDANSETRON HCL 4 MG/2ML IJ SOLN: 4.0000 mg | Freq: Once | INTRAMUSCULAR | Status: DC | PRN

## 2020-02-10 MED ORDER — DEXAMETHASONE SODIUM PHOSPHATE 10 MG/ML IJ SOLN
INTRAMUSCULAR | Status: DC | PRN
  Administered 2020-02-10: 10 mg via INTRAVENOUS

## 2020-02-10 MED ORDER — EPHEDRINE SULFATE 50 MG/ML IJ SOLN
INTRAMUSCULAR | Status: DC | PRN
  Administered 2020-02-10: 5 mg via INTRAVENOUS
  Administered 2020-02-10: 10 mg via INTRAVENOUS
  Administered 2020-02-10: 5 mg via INTRAVENOUS
  Administered 2020-02-10: 10 mg via INTRAVENOUS

## 2020-02-10 MED ORDER — FENTANYL CITRATE (PF) 100 MCG/2ML IJ SOLN: 25.0000 ug | INTRAMUSCULAR | Status: DC | PRN

## 2020-02-10 MED ORDER — LACTATED RINGERS IV SOLN: INTRAVENOUS | Status: DC

## 2020-02-10 MED ORDER — FAMOTIDINE 20 MG PO TABS
20.0000 mg | ORAL_TABLET | Freq: Once | ORAL | Status: AC
  Administered 2020-02-10: 20 mg via ORAL

## 2020-02-10 MED ORDER — EPHEDRINE 5 MG/ML INJ
INTRAVENOUS | Status: AC
  Filled 2020-02-10: qty 10

## 2020-02-10 MED ORDER — ROCURONIUM BROMIDE 10 MG/ML (PF) SYRINGE
PREFILLED_SYRINGE | INTRAVENOUS | Status: AC
  Filled 2020-02-10: qty 10

## 2020-02-10 MED ORDER — ORAL CARE MOUTH RINSE: 15.0000 mL | Freq: Once | OROMUCOSAL | Status: AC

## 2020-02-10 MED ORDER — ROCURONIUM BROMIDE 100 MG/10ML IV SOLN
INTRAVENOUS | Status: DC | PRN
  Administered 2020-02-10: 30 mg via INTRAVENOUS
  Administered 2020-02-10 (×5): 5 mg via INTRAVENOUS

## 2020-02-10 MED ORDER — CHLORHEXIDINE GLUCONATE 0.12 % MT SOLN
15.0000 mL | Freq: Once | OROMUCOSAL | Status: AC
  Administered 2020-02-10: 15 mL via OROMUCOSAL

## 2020-02-10 MED ORDER — ONDANSETRON HCL 4 MG/2ML IJ SOLN
INTRAMUSCULAR | Status: DC | PRN
  Administered 2020-02-10: 4 mg via INTRAVENOUS

## 2020-02-10 MED ORDER — LIDOCAINE HCL (CARDIAC) PF 100 MG/5ML IV SOSY
PREFILLED_SYRINGE | INTRAVENOUS | Status: DC | PRN
  Administered 2020-02-10: 60 mg via INTRAVENOUS

## 2020-02-10 MED ORDER — FAMOTIDINE 20 MG PO TABS
ORAL_TABLET | ORAL | Status: AC
  Filled 2020-02-10: qty 1

## 2020-02-10 MED ORDER — IOHEXOL 180 MG/ML  SOLN
INTRAMUSCULAR | Status: DC | PRN
  Administered 2020-02-10: 20 mL

## 2020-02-10 MED ORDER — SUGAMMADEX SODIUM 200 MG/2ML IV SOLN
INTRAVENOUS | Status: DC | PRN
  Administered 2020-02-10: 150 mg via INTRAVENOUS
  Administered 2020-02-10: 50 mg via INTRAVENOUS

## 2020-02-10 SURGICAL SUPPLY — 33 items
BAG DRAIN CYSTO-URO LG1000N (MISCELLANEOUS) ×3 IMPLANT
BASKET PARACHUTE 3.1  ZE (MISCELLANEOUS) ×2 IMPLANT
BASKET ZERO TIP 1.9FR (BASKET) IMPLANT
BRUSH SCRUB EZ 1% IODOPHOR (MISCELLANEOUS) ×3 IMPLANT
BSKT STON RTRVL ZERO TP 1.9FR (BASKET)
CATH URETL 5X70 OPEN END (CATHETERS) IMPLANT
CNTNR SPEC 2.5X3XGRAD LEK (MISCELLANEOUS) ×1
CONT SPEC 4OZ STER OR WHT (MISCELLANEOUS) ×2
CONT SPEC 4OZ STRL OR WHT (MISCELLANEOUS) ×1
CONTAINER SPEC 2.5X3XGRAD LEK (MISCELLANEOUS) IMPLANT
DRAPE UTILITY 15X26 TOWEL STRL (DRAPES) ×3 IMPLANT
FIBER LASER TRACTIP 200 (UROLOGICAL SUPPLIES) ×3 IMPLANT
GLOVE BIOGEL PI IND STRL 7.5 (GLOVE) ×1 IMPLANT
GLOVE BIOGEL PI INDICATOR 7.5 (GLOVE) ×2
GOWN STRL REUS W/ TWL LRG LVL3 (GOWN DISPOSABLE) ×1 IMPLANT
GOWN STRL REUS W/ TWL XL LVL3 (GOWN DISPOSABLE) ×1 IMPLANT
GOWN STRL REUS W/TWL LRG LVL3 (GOWN DISPOSABLE) ×3
GOWN STRL REUS W/TWL XL LVL3 (GOWN DISPOSABLE) ×3
GUIDEWIRE STR DUAL SENSOR (WIRE) ×3 IMPLANT
INFUSOR MANOMETER BAG 3000ML (MISCELLANEOUS) ×3 IMPLANT
INTRODUCER DILATOR DOUBLE (INTRODUCER) IMPLANT
KIT TURNOVER CYSTO (KITS) ×3 IMPLANT
PACK CYSTO AR (MISCELLANEOUS) ×3 IMPLANT
SET CYSTO W/LG BORE CLAMP LF (SET/KITS/TRAYS/PACK) ×3 IMPLANT
SHEATH URETERAL 12FRX35CM (MISCELLANEOUS) IMPLANT
SOL .9 NS 3000ML IRR  AL (IV SOLUTION) ×3
SOL .9 NS 3000ML IRR AL (IV SOLUTION) ×1
SOL .9 NS 3000ML IRR UROMATIC (IV SOLUTION) ×1 IMPLANT
STENT URET 6FRX24 CONTOUR (STENTS) ×2 IMPLANT
STENT URET 6FRX26 CONTOUR (STENTS) IMPLANT
SURGILUBE 2OZ TUBE FLIPTOP (MISCELLANEOUS) ×3 IMPLANT
VALVE UROSEAL ADJ ENDO (VALVE) IMPLANT
WATER STERILE IRR 1000ML POUR (IV SOLUTION) ×3 IMPLANT

## 2020-02-10 NOTE — Anesthesia Postprocedure Evaluation (Signed)
Anesthesia Post Note  Patient: Brandi Hanson [facility]  Procedure(s) Performed: CYSTOSCOPY/URETEROSCOPY/HOLMIUM LASER/STENT Exchange (Left Ureter) CYSTOSCOPY WITH RETROGRADE PYELOGRAM (Left Ureter)  Patient location during evaluation: PACU Anesthesia Type: General Level of consciousness: awake and alert Pain management: pain level controlled Vital Signs Assessment: post-procedure vital signs reviewed and stable Respiratory status: spontaneous breathing, nonlabored ventilation and respiratory function stable Cardiovascular status: blood pressure returned to baseline and stable Postop Assessment: no signs of nausea or vomiting Anesthetic complications: no     Last Vitals:  Vitals:   02/10/20 1404 02/10/20 1419  BP: (!) 155/74 134/73  Pulse: 83 80  Resp: 17   Temp:  36.8 C  SpO2: 100% 95%    Last Pain:  Vitals:   02/10/20 1419  TempSrc:   PainSc: 0-No pain                 [name]

## 2020-02-10 NOTE — Telephone Encounter (Signed)
-----   Message from Abbie Sons, MD sent at 02/10/2020  1:33 PM EDT ----- Regarding: Postop stent removal Please schedule cystoscopy with stent removal 10-14 days

## 2020-02-10 NOTE — Transfer of Care (Signed)
Immediate Anesthesia Transfer of Care Note  Patient: Brandi Hanson  Procedure(s) Performed: CYSTOSCOPY/URETEROSCOPY/HOLMIUM LASER/STENT Exchange (Left Ureter)  Patient Location: PACU  Anesthesia Type:General  Level of Consciousness: drowsy  Airway & Oxygen Therapy: Patient Spontanous Breathing and Patient connected to face mask oxygen  Post-op Assessment: Report given to RN and Post -op Vital signs reviewed and stable  Post vital signs: Reviewed and stable  Last Vitals:  Vitals Value Taken Time  BP 156/81 02/10/20 1319  Temp 36.9 C 02/10/20 1319  Pulse 64 02/10/20 1321  Resp 13 02/10/20 1321  SpO2 100 % 02/10/20 1321  Vitals shown include unvalidated device data.  Last Pain:  Vitals:   02/10/20 1107  TempSrc: Temporal  PainSc: 0-No pain         Complications: No apparent anesthesia complications

## 2020-02-10 NOTE — Anesthesia Preprocedure Evaluation (Signed)
Anesthesia Evaluation  Patient identified by MRN, date of birth, ID band Patient awake    Reviewed: Allergy & Precautions, NPO status , Patient's Chart, lab work & pertinent test results  History of Anesthesia Complications Negative for: history of anesthetic complications  Airway Mallampati: III  TM Distance: <3 FB Neck ROM: Full    Dental  (+) Implants   Pulmonary sleep apnea , neg COPD,    breath sounds clear to auscultation- rhonchi (-) wheezing      Cardiovascular hypertension, Pt. on medications (-) CAD, (-) Past MI, (-) Cardiac Stents and (-) CABG  Rhythm:Regular Rate:Normal - Systolic murmurs and - Diastolic murmurs    Neuro/Psych  Headaches, neg Seizures PSYCHIATRIC DISORDERS Anxiety Depression Dementia    GI/Hepatic Neg liver ROS, GERD  ,  Endo/Other  negative endocrine ROSneg diabetes  Renal/GU Renal disease (nephrolithiasis)     Musculoskeletal  (+) Arthritis ,   Abdominal (+) - obese,   Peds  Hematology  (+) anemia ,   Anesthesia Other Findings Past Medical History: No date: Abdominal distention No date: Abdominal pain No date: Anxiety No date: Arthritis 01/08/2020: Bacteremia due to Gram-negative bacteria 01/02/2012: Benign essential HTN 07/22/2011: Breast cancer in situ No date: Cancer [facility])     Comment:  skin No date: Confusion No date: Depression No date: Generalized headaches No date: GERD (gastroesophageal reflux disease) No date: Hyperlipidemia 01/02/2012: Hyperlipidemia No date: Hypertension No date: Memory loss No date: Osteoporosis No date: Palpitations No date: PVC (premature ventricular contraction) No date: Rectal pain No date: Sleep apnea   Reproductive/Obstetrics                             Anesthesia Physical Anesthesia Plan  ASA: III  Anesthesia Plan: General   Post-op Pain Management:    Induction: Intravenous  PONV Risk Score and Plan:  2 and Ondansetron, Treatment may vary due to age or medical condition and Dexamethasone  Airway Management Planned: Oral ETT  Additional Equipment:   Intra-op Plan:   Post-operative Plan: Extubation in OR  Informed Consent: I have reviewed the patients History and Physical, chart, labs and discussed the procedure including the risks, benefits and alternatives for the proposed anesthesia with the patient or authorized representative who has indicated his/her understanding and acceptance.     Dental advisory given and Consent reviewed with POA (Consent from pt's sister, Wynona Canes, who is her HCPOA)  Plan Discussed with: CRNA and Anesthesiologist  Anesthesia Plan Comments:         Anesthesia Quick Evaluation

## 2020-02-10 NOTE — Anesthesia Procedure Notes (Addendum)
Procedure Name: Intubation Date/Time: 02/10/2020 11:37 AM Performed by: Kelton Pillar, CRNA Pre-anesthesia Checklist: Patient identified, Emergency Drugs available, Suction available and Patient being monitored Patient Re-evaluated:Patient Re-evaluated prior to induction Oxygen Delivery Method: Circle system utilized Preoxygenation: Pre-oxygenation with 100% oxygen Induction Type: IV induction Ventilation: Mask ventilation without difficulty Laryngoscope Size: McGraph and 3 Grade View: Grade I Tube type: Oral Tube size: 6.5 mm Number of attempts: 1 Airway Equipment and Method: Stylet and Oral airway Placement Confirmation: ETT inserted through vocal cords under direct vision,  positive ETCO2 and breath sounds checked- equal and bilateral Secured at: 20 cm Tube secured with: Tape Dental Injury: Teeth and Oropharynx as per pre-operative assessment  Comments: Atraumatic intubation by [place]

## 2020-02-10 NOTE — Op Note (Signed)
Preoperative diagnosis:  1.  Left proximal ureteral calculus 2.  Left nephrolithiasis  Postoperative diagnosis: Same  Procedure:  1. Cystoscopy 2. Left ureteroscopy and stone removal 3. Ureteroscopic laser lithotripsy 4. Left ureteral stent exchange 6FR) 24 cm 5. Left retrograde pyelography with interpretation  Surgeon: Nicki Reaper C. Yocheved Depner, M.D.  Anesthesia: General  Complications: None  Intraoperative findings:  1.  Left retrograde pyelography post procedure showed no filling defects, stone fragments or contrast extravasation  2.  Left lower proximal ureteral calculus  EBL: Minimal  Specimens: 1. Calculus fragments for analysis   Indication: Brandi Hanson is a 84 y.o. year old patient with a history of recurrent sepsis requiring 2 hospital admissions within a 1 month timeframe.  During her last hospitalization a CT was obtained which showed an 8 mm left proximal ureteral calculus with hydronephrosis.  A left ureteral stent was placed on 01/12/2020. After reviewing the management options for treatment, the patient elected to proceed with the above surgical procedure(s). We have discussed (with her legal guardian) the potential benefits and risks of the procedure, side effects of the proposed treatment, the likelihood of the patient achieving the goals of the procedure, and any potential problems that might occur during the procedure or recuperation. Informed consent has been obtained.  Description of procedure:  The patient was taken to the operating room and general anesthesia was induced.  The patient was placed in the dorsal lithotomy position, prepped and draped in the usual sterile fashion, and preoperative antibiotics were administered. A preoperative time-out was performed.   A 21 French cystoscope was lubricated and passed under direct vision.  Panendoscopy was performed and the bladder mucosa showed no erythema, solid or papillary lesions.  Inflammatory changes secondary to  her indwelling stent were noted.  The ureteral stent was slightly encrusted and was grasped with endoscopic forceps and brought out to the urethral meatus however a Sensor could not be advanced through the stent.  The cystoscope was repassed and the Sensor wire was then advanced alongside the stent to the renal pelvis followed by removal of her double-J stent.  A 4.5 Fr semirigid ureteroscope was then advanced into the ureter next to the guidewire and the calculus was identified in the lower portion of the proximal ureter.  Moderate inflammatory changes of the ureteral mucosa were noted.   The calculus was then dusted/fragmented with a 200 micron holmium laser fiber on a setting of 0.2 J and frequency of 20 hz which was subsequently increased to 0.2/30.   All fragments were then removed from the ureter with a parachute basket and a zero tip nitinol basket.  Reinspection of the ureter revealed no remaining visible stones or fragments.   The semirigid ureteroscope was removed and a single channel digital flexible ureteroscope was passed per urethra.  The left ureteral orifice was easily accessed by the ureteroscope and advanced proximally.  The ureteroscope was advanced to the renal pelvis.  All calyces were examined and in a midpole calyx a partially embedded calculus emanating from a renal papilla was noted.  This was able to be placed in a 1.9 [place] nitinol basket and removed without difficulty.  Retrograde pyelogram was performed with findings as described above.  All calyces were examined and no additional calculi were noted.  The ureteroscope was removed and a 6 FR/24 CM Contour ureteral stent was was placed under fluoroscopic guidance.  The wire was then removed with an adequate stent curl noted in the renal pelvis as well as  in the bladder.  The bladder was then emptied and the procedure ended.  The patient appeared to tolerate the procedure well and without complications.  After anesthetic  reversal the patient was transported to the PACU in stable condition.   Plan: -Her stent will remain indwelling for 10-14 days and office follow-up will be scheduled for stent removal.   John Giovanni, MD

## 2020-02-10 NOTE — Telephone Encounter (Signed)
APP MADE ° °

## 2020-02-10 NOTE — OR Nursing (Signed)
MAR from [facility] was reviewed with Ellison Hughs - Director at the memory care unit.  She also states that patient has been NPO since midnight with exception of water with her pills this AM.

## 2020-02-10 NOTE — Discharge Instructions (Addendum)
DISCHARGE INSTRUCTIONS FOR KIDNEY STONE/URETERAL STENT   MEDICATIONS:  1. Resume all your other meds from home.  2.  AZO (over-the-counter) can help with the burning/stinging when you urinate.   ACTIVITY:  1. May resume regular activities in 24 hours. 2. Drink plenty of water   BATHING:  1.  Per routine    SIGNS/SYMPTOMS TO CALL:  Please call us if you have a fever greater than 101.5, uncontrolled nausea/vomiting, uncontrolled pain, dizziness, unable to urinate, excessively bloody urine, chest pain, shortness of breath, leg swelling, leg pain, or any other concerns or questions.   You can reach [place] at [phone].   FOLLOW-UP:  1.  If you do not have an appointment for stent removal by the time of hospital discharge our office will contact you     [place]   1) The drugs that you were given will stay in your system until tomorrow so for the next 24 hours you should not:  A) Drive an automobile B) Make any legal decisions C) Drink any alcoholic beverage   2) You may resume regular meals tomorrow.  Today it is better to start with liquids and gradually work up to solid foods.  You may eat anything you prefer, but it is better to start with liquids, then soup and crackers, and gradually work up to solid foods.   3) Please notify your doctor immediately if you have any unusual bleeding, trouble breathing, redness and pain at the surgery site, drainage, fever, or pain not relieved by medication.    4) Additional Instructions:        Please contact your physician with any problems or Same Day Surgery at [phone], Monday through Friday 6 am to 4 pm, or [place] at [facility] number at [phone].

## 2020-02-10 NOTE — Interval H&P Note (Signed)
History and Physical Interval Note: I spoke with patient's sister and all questions were answered. Lungs: Clear  CV: RRR  02/10/2020 11:18 AM  Brandi Hanson  has presented today for surgery, with the diagnosis of left ureteral stone.  The various methods of treatment have been discussed with the patient and family. After consideration of risks, benefits and other options for treatment, the patient has consented to  Procedure(s): CYSTOSCOPY/URETEROSCOPY/HOLMIUM LASER/STENT Exchange (Left) as a surgical intervention.  The patient's history has been reviewed, patient examined, no change in status, stable for surgery.  I have reviewed the patient's chart and labs.  Questions were answered to the patient's satisfaction.     [place]

## 2020-02-17 LAB — CALCULI, WITH PHOTOGRAPH (CLINICAL LAB)
Calcium Oxalate Dihydrate: 50 %
Calcium Oxalate Monohydrate: 10 %
Hydroxyapatite: 40 %
Weight Calculi: 25 mg

## 2020-02-19 ENCOUNTER — Ambulatory Visit: Payer: Medicare PPO | Admitting: Gastroenterology

## 2020-02-20 ENCOUNTER — Ambulatory Visit (INDEPENDENT_AMBULATORY_CARE_PROVIDER_SITE_OTHER): Payer: Medicare PPO | Admitting: Urology

## 2020-02-20 ENCOUNTER — Other Ambulatory Visit: Payer: Self-pay

## 2020-02-20 DIAGNOSIS — N201 Calculus of ureter: Secondary | ICD-10-CM | POA: Diagnosis not present

## 2020-02-20 LAB — MICROSCOPIC EXAMINATION
RBC, Urine: >30 /hpf — ABNORMAL HIGH (ref 0–2)
WBC, UA: >30 /hpf — ABNORMAL HIGH (ref 0–5)

## 2020-02-20 LAB — URINALYSIS, COMPLETE
Bilirubin, UA: NEGATIVE
Glucose, UA: NEGATIVE
Ketones, UA: NEGATIVE
Nitrite, UA: NEGATIVE
Specific Gravity, UA: 1.02 (ref 1.005–1.030)
Urobilinogen, Ur: 0.2 mg/dL (ref 0.2–1.0)
pH, UA: 7.5 (ref 5.0–7.5)

## 2020-02-21 NOTE — Progress Notes (Signed)
Indications: Patient is 84 y.o., female who recently underwent left ureteroscopic stone removal.  She had no postoperative problems. The patient is presenting today for stent removal.  Procedure:  Flexible Cystoscopy with stent removal (54270)  Timeout was performed and the correct patient, procedure and participants were identified.    Description:  The patient was prepped and draped in the usual sterile fashion. Flexible cystosopy was performed.  The stent was visualized, grasped, and removed intact without difficulty. The patient tolerated the procedure well.  A single dose of oral antibiotics was given.  Complications:  None  Plan:  -Instructed to call for fever or flank pain -RUS 6 weeks

## 2020-03-24 ENCOUNTER — Ambulatory Visit
Admission: RE | Admit: 2020-03-24 | Discharge: 2020-03-24 | Disposition: A | Discharge status: Home / Self Care | Payer: Medicare PPO | Source: Ambulatory Visit | Attending: Urology | Admitting: Urology

## 2020-03-24 ENCOUNTER — Other Ambulatory Visit: Payer: Self-pay

## 2020-03-24 DIAGNOSIS — N201 Calculus of ureter: Secondary | ICD-10-CM | POA: Insufficient documentation

## 2020-03-25 ENCOUNTER — Telehealth: Payer: Self-pay | Admitting: *Deleted

## 2020-03-25 NOTE — Telephone Encounter (Signed)
-----   Message from Abbie Sons, MD sent at 03/25/2020  8:08 AM EDT ----- RUS was normal without stones or blockage. Recc 1 year f/u with kub. Pt with advaned dementia. Sister is HCPOA

## 2020-03-25 NOTE — Telephone Encounter (Signed)
Left message on sister vm

## 2020-04-27 ENCOUNTER — Ambulatory Visit: Payer: Medicare PPO | Admitting: Gastroenterology

## 2020-04-27 ENCOUNTER — Encounter: Payer: Self-pay | Admitting: Dermatology

## 2020-04-27 ENCOUNTER — Other Ambulatory Visit: Payer: Self-pay

## 2020-04-27 ENCOUNTER — Ambulatory Visit: Payer: Medicare PPO | Admitting: Dermatology

## 2020-04-27 ENCOUNTER — Encounter: Payer: Self-pay | Admitting: Gastroenterology

## 2020-04-27 VITALS — BP 137/78 | HR 81 | Temp 97.8°F | Ht 65.0 in | Wt 124.2 lb

## 2020-04-27 DIAGNOSIS — L821 Other seborrheic keratosis: Secondary | ICD-10-CM

## 2020-04-27 DIAGNOSIS — L814 Other melanin hyperpigmentation: Secondary | ICD-10-CM | POA: Diagnosis not present

## 2020-04-27 DIAGNOSIS — D692 Other nonthrombocytopenic purpura: Secondary | ICD-10-CM

## 2020-04-27 DIAGNOSIS — D1800 Hemangioma unspecified site: Secondary | ICD-10-CM

## 2020-04-27 DIAGNOSIS — L578 Other skin changes due to chronic exposure to nonionizing radiation: Secondary | ICD-10-CM

## 2020-04-27 DIAGNOSIS — D485 Neoplasm of uncertain behavior of skin: Secondary | ICD-10-CM | POA: Diagnosis not present

## 2020-04-27 DIAGNOSIS — K6289 Other specified diseases of anus and rectum: Secondary | ICD-10-CM | POA: Diagnosis not present

## 2020-04-27 DIAGNOSIS — L570 Actinic keratosis: Secondary | ICD-10-CM | POA: Diagnosis not present

## 2020-04-27 DIAGNOSIS — Z1283 Encounter for screening for malignant neoplasm of skin: Secondary | ICD-10-CM | POA: Diagnosis not present

## 2020-04-27 DIAGNOSIS — D229 Melanocytic nevi, unspecified: Secondary | ICD-10-CM

## 2020-04-27 NOTE — Patient Instructions (Addendum)
Wound Care Instructions  1. Cleanse wound gently with soap and water once a day then pat dry with clean gauze. Apply a thing coat of Petrolatum (petroleum jelly, "Vaseline") over the wound (unless you have an allergy to this). We recommend that you use a new, sterile tube of Vaseline. Do not pick or remove scabs. Do not remove the yellow or white "healing tissue" from the base of the wound.  2. Cover the wound with fresh, clean, nonstick gauze and secure with paper tape. You may use Band-Aids in place of gauze and tape if the would is small enough, but would recommend trimming much of the tape off as there is often too much. Sometimes Band-Aids can irritate the skin.  3. You should call the office for your biopsy report after 1 week if you have not already been contacted.  4. If you experience any problems, such as abnormal amounts of bleeding, swelling, significant bruising, significant pain, or evidence of infection, please call the office immediately.  5. FOR ADULT SURGERY PATIENTS: If you need something for pain relief you may take 1 extra strength Tylenol (acetaminophen) AND 2 Ibuprofen (200mg each) together every 4 hours as needed for pain. (do not take these if you are allergic to them or if you have a reason you should not take them.) Typically, you may only need pain medication for 1 to 3 days.   Cryotherapy Aftercare  . Wash gently with soap and water everyday.   . Apply Vaseline and Band-Aid daily until healed.  Prior to procedure, discussed risks of blister formation, small wound, skin dyspigmentation, or rare scar following cryotherapy.   

## 2020-04-27 NOTE — Progress Notes (Signed)
New Patient Visit  Subjective  Brandi Hanson is a 84 y.o. female who presents for the following: Annual Exam (Patient here today for TBSE. No known history of skin cancer. ).  Patient has a spot at left temple that gets irritated.   Patient accompanied by sister, Wynona Canes.  The following portions of the chart were reviewed this encounter and updated as appropriate:  Tobacco  Allergies  Meds  Problems  Med Hx  Surg Hx  Fam Hx      Review of Systems:  No other skin or systemic complaints except as noted in HPI or Assessment and Plan.  Objective  Well appearing patient in no apparent distress; mood and affect are within normal limits.  A full examination was performed including scalp, head, eyes, ears, nose, lips, neck, chest, axillae, abdomen, back, buttocks, bilateral upper extremities, bilateral lower extremities, hands, feet, fingers, toes, fingernails, and toenails. All findings within normal limits unless otherwise noted below.  Objective  Left Temple: Verrucous papule 0.9cm     Objective  Arms: Violaceous macules and patches.   Objective  R Nose: Erythematous thin papules/macules with gritty scale.    Assessment & Plan  Neoplasm of uncertain behavior of skin Left Temple  Epidermal / dermal shaving  Lesion diameter (cm):  1.1 Informed consent: discussed and consent obtained   Timeout: patient name, date of birth, surgical site, and procedure verified   Patient was prepped and draped in usual sterile fashion: area prepped with isopropyl alcohol. Anesthesia: the lesion was anesthetized in a standard fashion   Anesthetic:  1% lidocaine w/ epinephrine 1-100,000 buffered w/ 8.4% NaHCO3 Instrument used: flexible razor blade   Hemostasis achieved with: aluminum chloride   Outcome: patient tolerated procedure well   Post-procedure details: wound care instructions given   Additional details:  Mupirocin and a bandage applied  Specimen 1 - Surgical  pathology Differential Diagnosis: r/o Verruca vs Verrucous Keratosis Check Margins: No Verrucous papule 0.9cm  Symptomatic. Will perform shave removal, rule out verruca vs verrucous keratosis > other  Senile purpura (HCC) Arms  Benign, observe.    AK (actinic keratosis) R Nose  Recheck in 3 months, consider biopsy if not resolved.   Destruction of lesion - R Nose Complexity: simple   Destruction method: cryotherapy   Informed consent: discussed and consent obtained   Lesion destroyed using liquid nitrogen: Yes   Cryotherapy cycles:  2 Outcome: patient tolerated procedure well with no complications   Post-procedure details: wound care instructions given    Lentigines - Scattered tan macules - Discussed due to sun exposure - Benign, observe - Call for any changes  Seborrheic Keratoses - Stuck-on, waxy, tan-brown papules and plaques  - Discussed benign etiology and prognosis. - Observe - Call for any changes  Melanocytic Nevi - Tan-brown and/or pink-flesh-colored symmetric macules and papules - Benign appearing on exam today - Observation - Call clinic for new or changing moles - Recommend daily use of broad spectrum spf 30+ sunscreen to sun-exposed areas.   Hemangiomas - Red papules - Discussed benign nature - Observe - Call for any changes  Actinic Damage - diffuse scaly erythematous macules with underlying dyspigmentation - Recommend daily broad spectrum sunscreen SPF 30+ to sun-exposed areas, reapply every 2 hours as needed.  - Call for new or changing lesions.  Skin cancer screening performed today.   Return in about 3 months (around 07/28/2020) for AK follow up.  Graciella Belton, RMA, am acting as Education administrator for [employer],  MD .  Documentation: I have reviewed the above documentation for accuracy and completeness, and I agree with the above.  Forest Gleason, MD

## 2020-04-28 NOTE — Progress Notes (Signed)
Brandi Hanson [address]  [place], [place] [address]  Main: [phone]  Fax: [phone]   Gastroenterology Consultation  Referring Provider:     Dr. Sarajane Jews Primary Care Physician:  System, Pcp Not In Reason for Consultation:   Rectal thickening        HPI:    Chief Complaint  Patient presents with  . Rectal wall thickening    Brandi Hanson is a 84 y.o. y/o female referred for consultation & management  by Dr. Estanislado Spire, Pcp Not In.  Patient was recently hospitalized in May 2021 with hydronephrosis requiring stent placement, bacteremia and incidentally CT showed rectal wall thickening and patient was referred to [place].  Patient has dementia and presents with her sister who is power of attorney and another family member.  Patient is unable to provide reliable history herself.  However, she denies any abdominal or rectal complaints.  Family members have not noticed any blood in her stool, any issues with rectal pain, constipation or diarrhea.  Remote history of colonoscopy in 2014, procedure report not available but note under procedures tab says "Dr. Oletta Lamas, no repeat date indicated due to patient's age".  No family history of colon cancer.  Past Medical History:  Diagnosis Date  . Abdominal distention   . Abdominal pain   . Anxiety   . Arthritis   . Bacteremia due to Gram-negative bacteria 01/08/2020  . Benign essential HTN 01/02/2012  . Breast cancer in situ 07/22/2011  . Cancer ([place])    skin  . Confusion   . Depression   . Generalized headaches   . GERD (gastroesophageal reflux disease)   . Hyperlipidemia   . Hyperlipidemia 01/02/2012  . Hypertension   . Memory loss   . Osteoporosis   . Palpitations   . PVC (premature ventricular contraction)   . Rectal pain   . Sleep apnea     Past Surgical History:  Procedure Laterality Date  . ABDOMINAL HYSTERECTOMY  1978  . [place]   left  . breast cancer  10/2010   ductal  carcinoma in situ, left breast  . CATARACT EXTRACTION  2011   bilateral  . CYSTOSCOPY W/ RETROGRADES Left 02/10/2020   Procedure: CYSTOSCOPY WITH RETROGRADE PYELOGRAM;  Surgeon: Abbie Sons, MD;  Location: [facility] ORS;  Service: Urology;  Laterality: Left;  . CYSTOSCOPY WITH STENT PLACEMENT Left 01/12/2020   Procedure: CYSTOSCOPY WITH STENT PLACEMENT;  Surgeon: Abbie Sons, MD;  Location: [facility] ORS;  Service: Urology;  Laterality: Left;  . CYSTOSCOPY/URETEROSCOPY/HOLMIUM LASER/STENT PLACEMENT Left 02/10/2020   Procedure: CYSTOSCOPY/URETEROSCOPY/HOLMIUM LASER/STENT Exchange;  Surgeon: Abbie Sons, MD;  Location: [facility] ORS;  Service: Urology;  Laterality: Left;  . FOOT SURGERY  2005  . KIDNEY STONE SURGERY  2011  . ROTATOR CUFF REPAIR  2007    Prior to Admission medications   Medication Sig Start Date End Date Taking? Authorizing Provider  acetaminophen (TYLENOL) 325 MG tablet Take 650 mg by mouth in the morning and at bedtime.    Yes [provider]  acetaminophen (TYLENOL) 500 MG tablet Take 500 mg by mouth every 6 (six) hours as needed for mild pain or fever.   Yes [provider]  amLODipine (NORVASC) 10 MG tablet Take 1 tablet by mouth daily. 04/23/20  Yes [provider]  Calcium Carb-Cholecalciferol (CALCIUM 600+D) 600-800 MG-UNIT TABS Take 1 tablet by mouth 2 (two) times daily.    Yes [provider]  Cholecalciferol (VITAMIN D) 50 MCG (2000 UT) CAPS Take 2,000 Units by mouth daily.   Yes [provider]  cloNIDine (CATAPRES) 0.1 MG tablet Take 1 tablet by mouth daily. 04/06/20  Yes [provider]  donepezil (ARICEPT) 10 MG tablet TAKE 1 TABLET BY MOUTH AT BEDTIME Patient taking differently: Take 10 mg by mouth daily.  05/16/17  Yes Garvin Fila, MD  fluticasone (FLONASE) 50 MCG/ACT nasal spray Place 1 spray into both nostrils daily.    Yes [provider]  hydrochlorothiazide (HYDRODIURIL) 25 MG tablet Take 1 tablet by  mouth daily. 04/23/20  Yes [provider]  loratadine (CLARITIN) 10 MG tablet Take 10 mg by mouth daily.   Yes [provider]  LORazepam (ATIVAN) 0.5 MG tablet Take 0.5 tablets (0.25 mg total) by mouth 2 (two) times daily as needed for anxiety or sleep. 01/13/20  Yes Samuella Cota, MD  melatonin 3 MG TABS tablet Take 3 mg by mouth at bedtime.   Yes [provider]  memantine (NAMENDA) 10 MG tablet Take 10 mg by mouth 2 (two) times daily.   Yes [provider]  ondansetron (ZOFRAN) 8 MG tablet Take 8 mg by mouth every 8 (eight) hours as needed for nausea or vomiting.   Yes [provider]  polyethylene glycol (MIRALAX / GLYCOLAX) 17 g packet Take 17 g by mouth daily. 12/17/19  Yes Lorella Nimrod, MD  pravastatin (PRAVACHOL) 20 MG tablet Take 1 tablet by mouth daily. 04/23/20  Yes [provider]  propranolol (INDERAL) 20 MG tablet Take 1 tablet (20 mg total) by mouth 2 (two) times daily. 01/07/18  Yes Jerrol Banana., MD  Propylene Glycol (SYSTANE COMPLETE OP) Place 1 drop into both eyes in the morning and at bedtime.    Yes [provider]  risperiDONE (RISPERDAL) 0.25 MG tablet Take 0.25 mg by mouth at bedtime.   Yes [provider]  sertraline (ZOLOFT) 100 MG tablet Take 150 mg by mouth daily.   Yes [provider]  vitamin B-12 1000 MCG tablet Take 1 tablet (1,000 mcg total) by mouth daily. 12/17/19  Yes Lorella Nimrod, MD    Family History  Problem Relation Age of Onset  . Cancer Mother        breast  . Crohn's disease Mother   . Cancer Father        lung  . Hypertension Father   . Cancer Sister        breast and uterine  . Hypertension Sister   . Heart disease Maternal Grandmother   . Cancer Maternal Grandfather        colon  . Hypertension Maternal Grandfather   . Heart disease Paternal Grandmother   . Heart disease Paternal Grandfather      Social History   Tobacco Use  . Smoking status:  Never Smoker  . Smokeless tobacco: Never Used  Vaping Use  . Vaping Use: Never used  Substance Use Topics  . Alcohol use: No  . Drug use: No    Allergies as of 04/27/2020 - Review Complete 04/27/2020  Allergen Reaction Noted  . Sulfa antibiotics Nausea Only 08/18/2013    Review of Systems:    All systems reviewed and negative except where noted in HPI.   Physical Exam:  BP 137/78   Pulse 81   Temp 97.8 F (36.6 C) (Oral)   Ht 5\' 5"  (1.651 m)   Wt 124 lb 3.2 oz (56.3 kg)  BMI 20.67 kg/m  No LMP recorded. Patient is postmenopausal. Psych:  Alert and cooperative. Normal mood and affect. General:   Alert,  Well-developed, well-nourished, pleasant and cooperative in NAD Head:  Normocephalic and atraumatic. Eyes:  Sclera clear, no icterus.   Conjunctiva pink. Ears:  Normal auditory acuity. Nose:  No deformity, discharge, or lesions. Mouth:  No deformity or lesions,oropharynx pink & moist. Neck:  Supple; no masses or thyromegaly. Abdomen:  Normal bowel sounds.  No bruits.  Soft, non-tender and non-distended without masses, hepatosplenomegaly or hernias noted.  No guarding or rebound tenderness.    Msk:  Symmetrical without gross deformities. Good, equal movement & strength bilaterally. Pulses:  Normal pulses noted. Extremities:  No clubbing or edema.  No cyanosis. Neurologic:  Alert ;  grossly normal neurologically. Skin:  Intact without significant lesions or rashes. No jaundice. Lymph Nodes:  No significant cervical adenopathy. Psych:  Alert and cooperative. Normal mood and affect.   Labs: CBC    Component Value Date/Time   WBC 6.8 02/10/2020 1100   RBC 3.79 (L) 02/10/2020 1100   HGB 10.8 (L) 02/10/2020 1100   HGB 11.4 11/22/2017 0911   HGB 12.2 01/20/2016 1035   HCT 33.8 (L) 02/10/2020 1100   HCT 33.8 (L) 11/22/2017 0911   HCT 36.1 01/20/2016 1035   PLT 189 02/10/2020 1100   PLT 206 11/22/2017 0911   MCV 89.2 02/10/2020 1100   MCV 87 11/22/2017 0911   MCV  89.1 01/20/2016 1035   MCH 28.5 02/10/2020 1100   MCHC 32.0 02/10/2020 1100   RDW 14.7 02/10/2020 1100   RDW 13.5 11/22/2017 0911   RDW 12.8 01/20/2016 1035   LYMPHSABS 0.5 (L) 01/07/2020 1841   LYMPHSABS 0.9 11/28/2016 0935   LYMPHSABS 1.0 01/20/2016 1035   MONOABS 1.5 (H) 01/07/2020 1841   MONOABS 0.7 01/20/2016 1035   EOSABS 0.0 01/07/2020 1841   EOSABS 0.1 11/28/2016 0935   EOSABS 0.1 07/02/2013 1525   BASOSABS 0.0 01/07/2020 1841   BASOSABS 0.0 11/28/2016 0935   BASOSABS 0.0 01/20/2016 1035   CMP     Component Value Date/Time   NA 140 02/10/2020 1100   NA 144 11/22/2017 0911   NA 141 01/20/2016 1035   K 4.0 02/10/2020 1100   K 4.3 01/20/2016 1035   CL 101 02/10/2020 1100   CL 105 07/02/2013 1525   CL 105 12/31/2012 1032   CO2 29 02/10/2020 1100   CO2 30 (H) 01/20/2016 1035   GLUCOSE 93 02/10/2020 1100   GLUCOSE 101 01/20/2016 1035   GLUCOSE 101 (H) 12/31/2012 1032   BUN 16 02/10/2020 1100   BUN 17 11/22/2017 0911   BUN 20.7 01/20/2016 1035   CREATININE 0.66 02/10/2020 1100   CREATININE 0.7 01/20/2016 1035   CALCIUM 9.0 02/10/2020 1100   CALCIUM 9.5 01/20/2016 1035   PROT 6.1 (L) 01/07/2020 1841   PROT 5.9 (L) 11/22/2017 0911   PROT 6.4 01/20/2016 1035   ALBUMIN 3.2 (L) 01/07/2020 1841   ALBUMIN 4.1 11/22/2017 0911   ALBUMIN 3.6 01/20/2016 1035   AST 19 01/07/2020 1841   AST 13 01/20/2016 1035   ALT 15 01/07/2020 1841   ALT 12 01/20/2016 1035   ALKPHOS 66 01/07/2020 1841   ALKPHOS 75 01/20/2016 1035   BILITOT 0.5 01/07/2020 1841   BILITOT 0.3 11/22/2017 0911   BILITOT 0.47 01/20/2016 1035   GFRNONAA >60 02/10/2020 1100   GFRNONAA >60 07/02/2013 1525   GFRAA >60 02/10/2020 1100   GFRAA >60  07/02/2013 1525    Imaging Studies: See CT renal stone study from May 2021  Assessment and Plan:   ZYANNA LEISINGER is a 84 y.o. y/o female has been referred for rectal wall thickening noted on CT scan in May 2021  We discussed findings and options of further  steps in details with patient and family  Further steps would include colonoscopy or flexible sigmoidoscopy for evaluation of the rectal wall thickening seen on CT scan.  Flexible sigmoidoscopy can be done unsedated, with enema prep only to allow patient to tolerate the procedure better as colonoscopy with full prep is unlikely to be able to be tolerated by this patient with dementia.  Conservative management could include repeat CT scan to evaluate if the thickening is still present  We also discussed what the thickening could mean.  It could represent mass or malignancy in the area, versus false positive nonspecific finding, ulcerations or other lesions.  After discussing several options and risk of underlying malignancy when CT scan is showing rectal wall thickening, family would like to go home and discussed this in detail before proceeding with any further steps.  They state they may elect to not undergo any further testing since patient is asymptomatic and understand that this may mean that if there is a malignancy it is not diagnosed in a timely fashion.  They state they will call us back with their decision  Dr Brandi Hanson  Speech recognition software was used to dictate the above note.

## 2020-04-29 NOTE — Progress Notes (Signed)
Skin , left temple VERRUCA VULGARIS, IRRITATED --> no additional treatment needed  MAs please call. Thank you!

## 2020-05-03 ENCOUNTER — Telehealth: Payer: Self-pay

## 2020-05-03 NOTE — Telephone Encounter (Signed)
-----   Message from Alfonso Patten, MD sent at 04/29/2020  6:14 PM EDT ----- Skin , left temple VERRUCA VULGARIS, IRRITATED --> no additional treatment needed  MAs please call. Thank you!

## 2020-05-03 NOTE — Telephone Encounter (Signed)
Patient's sister advised biopsy benign wart, JS

## 2020-05-11 NOTE — Addendum Note (Signed)
Addended by: Alfonso Patten on: 05/11/2020 03:07 AM   Modules accepted: Level of Service

## 2020-05-18 ENCOUNTER — Other Ambulatory Visit: Payer: Self-pay

## 2020-05-18 ENCOUNTER — Ambulatory Visit: Payer: Medicare PPO | Admitting: Cardiovascular Disease

## 2020-05-18 ENCOUNTER — Encounter: Payer: Self-pay | Admitting: Cardiovascular Disease

## 2020-05-18 VITALS — BP 110/64 | HR 75 | Ht 64.0 in | Wt 122.0 lb

## 2020-05-18 DIAGNOSIS — G309 Alzheimer's disease, unspecified: Secondary | ICD-10-CM | POA: Diagnosis not present

## 2020-05-18 DIAGNOSIS — M7989 Other specified soft tissue disorders: Secondary | ICD-10-CM | POA: Diagnosis not present

## 2020-05-18 DIAGNOSIS — I5189 Other ill-defined heart diseases: Secondary | ICD-10-CM

## 2020-05-18 DIAGNOSIS — I10 Essential (primary) hypertension: Secondary | ICD-10-CM | POA: Diagnosis not present

## 2020-05-18 DIAGNOSIS — F0280 Dementia in other diseases classified elsewhere without behavioral disturbance: Secondary | ICD-10-CM

## 2020-05-18 NOTE — Patient Instructions (Addendum)
Please fax latest blood work, kidney function:  [phone]   Medication Instructions:  1) Stop the amlodipine, this can cause leg swelling  2) On 05/25/20 please start checking blood pressures once daily x 1 week and call [phone] or fax [phone] results.   If you need a refill on your cardiac medications before your next appointment, please call your pharmacy.    Lab work: No new labs needed   If you have labs (blood work) drawn today and your tests are completely normal, you will receive your results only by:  [place] (if you have MyChart) OR  A paper copy in the mail If you have any lab test that is abnormal or we need to change your treatment, we will call you to review the results.   Testing/Procedures: No new testing needed   Follow-Up: At [facility], you and your health needs are our priority.  As part of our continuing mission to provide you with exceptional heart care, we have created designated Provider Care Teams.  These Care Teams include your primary Cardiologist (physician) and Advanced Practice Providers (APPs -  Physician Assistants and Nurse Practitioners) who all work together to provide you with the care you need, when you need it.   You will need a follow up appointment in 12 months   Providers on your designated Care Team:    Murray Hodgkins, NP  Christell Faith, PA-C  Marrianne Mood, PA-C  Any Other Special Instructions Will Be Listed Below (If Applicable).  COVID-19 Vaccine Information can be found at: [email or web address].uk For questions related to vaccine distribution or appointments, please email [email or web address] or call [phone].

## 2020-05-18 NOTE — Progress Notes (Signed)
Cardiology Office Note  Date:  05/18/2020   ID:  Brandi Hanson, DOB [birth year], MRN [id number]  PCP:  System, Pcp Not In   Chief Complaint  Patient presents with  . New Patient (Initial Visit)    Ref by [name] to establish care for diastolic dysfunction & follow up Echo and LE doppler. Meds reviewed by the pt.'s med list from [facility]. Pt. c/o rapid heart beats with LE edema.     HPI:  [name] is a 84 year old woman with past medical history of Prior history of sepsis secondary to UTI Alzheimer's dementia Chronic diarrhea Who presents by referral from Irene Limbo for evaluation of her leg swelling, diastolic dysfunction  Lives at [place] work-up for lower extremity edema as below Echo with EF 60% Normal rvsp No significant valvular disease Images not available but results discussed with the patient and family who present with her today  On discussion of her leg swelling, Started on amlodipine July 2021 Clonidine 0.1 daily as needed for high blood pressure over 180 On HCTZ 25 daily, recently had potassium repletion with order for recheck Also on propranolol twice daily  She denies any complaints, denies any shortness of breath or chest discomfort on exertion  EKG personally reviewed by myself on todays visit Shows normal sinus rhythm rate 75 bpm no significant ST-T wave changes   PMH:   has a past medical history of Abdominal distention, Abdominal pain, Anxiety, Arthritis, Bacteremia due to Gram-negative bacteria (01/08/2020), Benign essential HTN (01/02/2012), Breast cancer in situ (07/22/2011), Cancer ([place]), Confusion, Depression, Generalized headaches, GERD (gastroesophageal reflux disease), Hyperlipidemia, Hyperlipidemia (01/02/2012), Hypertension, Memory loss, Osteoporosis, Palpitations, PVC (premature ventricular contraction), Rectal pain, and Sleep apnea.  PSH:    Past Surgical History:  Procedure Laterality Date  . ABDOMINAL HYSTERECTOMY   1978  . [place]   left  . breast cancer  10/2010   ductal carcinoma in situ, left breast  . CATARACT EXTRACTION  2011   bilateral  . CYSTOSCOPY W/ RETROGRADES Left 02/10/2020   Procedure: CYSTOSCOPY WITH RETROGRADE PYELOGRAM;  Surgeon: Abbie Sons, MD;  Location: [facility] ORS;  Service: Urology;  Laterality: Left;  . CYSTOSCOPY WITH STENT PLACEMENT Left 01/12/2020   Procedure: CYSTOSCOPY WITH STENT PLACEMENT;  Surgeon: Abbie Sons, MD;  Location: [facility] ORS;  Service: Urology;  Laterality: Left;  . CYSTOSCOPY/URETEROSCOPY/HOLMIUM LASER/STENT PLACEMENT Left 02/10/2020   Procedure: CYSTOSCOPY/URETEROSCOPY/HOLMIUM LASER/STENT Exchange;  Surgeon: Abbie Sons, MD;  Location: [facility] ORS;  Service: Urology;  Laterality: Left;  . FOOT SURGERY  2005  . KIDNEY STONE SURGERY  2011  . ROTATOR CUFF REPAIR  2007    Current Outpatient Medications  Medication Sig Dispense Refill  . acetaminophen (TYLENOL) 325 MG tablet Take 650 mg by mouth in the morning and at bedtime.     Marland Kitchen acetaminophen (TYLENOL) 500 MG tablet Take 500 mg by mouth every 6 (six) hours as needed for mild pain or fever.    Marland Kitchen amLODipine (NORVASC) 10 MG tablet Take 1 tablet by mouth daily.    . busPIRone (BUSPAR) 5 MG tablet Take 5 mg by mouth 2 (two) times daily.     . Calcium Carb-Cholecalciferol (CALCIUM 600+D) 600-800 MG-UNIT TABS Take 1 tablet by mouth 2 (two) times daily.     . Cholecalciferol (VITAMIN D) 50 MCG (2000 UT) CAPS Take 2,000 Units by mouth daily.    . cloNIDine (CATAPRES) 0.1 MG tablet Take 1 tablet by mouth daily.    Marland Kitchen  donepezil (ARICEPT) 10 MG tablet TAKE 1 TABLET BY MOUTH AT BEDTIME (Patient taking differently: Take 10 mg by mouth daily. ) 90 tablet 3  . fluticasone (FLONASE) 50 MCG/ACT nasal spray Place 1 spray into both nostrils daily.     . hydrochlorothiazide (HYDRODIURIL) 25 MG tablet Take 1 tablet by mouth daily.    Marland Kitchen loratadine (CLARITIN) 10 MG tablet Take 10 mg by mouth daily.    Marland Kitchen  LORazepam (ATIVAN) 0.5 MG tablet Take 0.5 tablets (0.25 mg total) by mouth 2 (two) times daily as needed for anxiety or sleep. 6 tablet 0  . melatonin 3 MG TABS tablet Take 3 mg by mouth at bedtime.    . memantine (NAMENDA) 10 MG tablet Take 10 mg by mouth 2 (two) times daily.    . ondansetron (ZOFRAN) 8 MG tablet Take 8 mg by mouth every 8 (eight) hours as needed for nausea or vomiting.    . polyethylene glycol (MIRALAX / GLYCOLAX) 17 g packet Take 17 g by mouth daily. 14 each 0  . pravastatin (PRAVACHOL) 20 MG tablet Take 1 tablet by mouth daily.    . propranolol (INDERAL) 20 MG tablet Take 1 tablet (20 mg total) by mouth 2 (two) times daily. 360 tablet 4  . Propylene Glycol (SYSTANE COMPLETE OP) Place 1 drop into both eyes in the morning and at bedtime.     . risperiDONE (RISPERDAL) 0.25 MG tablet Take 0.25 mg by mouth at bedtime.    . sertraline (ZOLOFT) 100 MG tablet Take 150 mg by mouth daily.    . vitamin B-12 1000 MCG tablet Take 1 tablet (1,000 mcg total) by mouth daily. 90 tablet 0   No current facility-administered medications for this visit.     Allergies:   Sulfa antibiotics   Social History:  The patient  reports that she has never smoked. She has never used smokeless tobacco. She reports that she does not drink alcohol and does not use drugs.   Family History:   family history includes Cancer in her father, maternal grandfather, mother, and sister; Crohn's disease in her mother; Heart disease in her maternal grandmother, paternal grandfather, and paternal grandmother; Hypertension in her father, maternal grandfather, and sister.    Review of Systems: Review of Systems  Constitutional: Negative.   HENT: Negative.   Respiratory: Negative.   Cardiovascular: Positive for leg swelling.  Gastrointestinal: Negative.   Musculoskeletal: Negative.   Neurological: Negative.   Psychiatric/Behavioral: Negative.   All other systems reviewed and are negative.    PHYSICAL  EXAM: VS:  BP 110/64 (BP Location: Right Arm, Patient Position: Sitting, Cuff Size: Normal)   Hanson 75   Ht 5\' 4"  (1.626 m)   Wt 122 lb (55.3 kg)   SpO2 98%   BMI 20.94 kg/m  , BMI Body mass index is 20.94 kg/m. GEN: Well nourished, well developed, in no acute distress HEENT: normal Neck: no JVD, carotid bruits, or masses Cardiac: RRR; no murmurs, rubs, or gallops, Trace pitting edema of the feet left slightly worse than the right respiratory:  clear to auscultation bilaterally, normal work of breathing GI: soft, nontender, nondistended, + BS MS: no deformity or atrophy Skin: warm and dry, no rash Neuro:  Strength and sensation are intact Psych: euthymic mood, full affect   Recent Labs: 01/07/2020: ALT 15 02/10/2020: BUN 16; Creatinine, Ser 0.66; Hemoglobin 10.8; Platelets 189; Potassium 4.0; Sodium 140    Lipid Panel Lab Results  Component Value Date   CHOL 255 (  H) 11/28/2016   HDL 51 11/28/2016   LDLCALC 164 (H) 11/28/2016   TRIG 202 (H) 11/28/2016      Wt Readings from Last 3 Encounters:  05/18/20 122 lb (55.3 kg)  04/27/20 124 lb 3.2 oz (56.3 kg)  02/10/20 119 lb 0.8 oz (54 kg)       ASSESSMENT AND PLAN:  Problem List Items Addressed This Visit    Dementia without behavioral disturbance (HCC)   Relevant Medications   busPIRone (BUSPAR) 5 MG tablet    Other Visit Diagnoses    Leg swelling    -  Primary   Diastolic dysfunction       Essential hypertension         Leg swelling Likely venous insufficiency, possibly exacerbated by amlodipine 10 mg daily started in July 2021 -Blood pressure relatively low today 712 systolic even on my recheck, Orthostatics were not checked Recommend we hold amlodipine given leg swelling and blood pressure -This may help lower extremity pitting edema of the feet, BMP requested from [facility] If there is signs of renal insufficiency from HCTZ, perhaps a diuretic could be changed to every other day with low-dose  potassium -Recommend recheck blood pressure starting next week every other day for 1 to 2 weeks -If this runs high, will avoid calcium channel blockers, would consider ARB's, even clonidine on a regular basis.  Other options include Cardura, hydralazine, ACE inhibitors -Consider leg elevation, ankle high compression sock   Diastolic dysfunction Lower extremity likely dependent edema, venous in nature Echocardiogram with normal right ventricular systolic pressure, no indication for additional diuretics at this time No further work-up needed   Disposition:   F/U as needed   Total encounter time more than 45 minutes  Greater than 50% was spent in counseling and coordination of care with the patient    Signed, Esmond Plants, M.D., Ph.D. [place], [place]

## 2020-05-20 ENCOUNTER — Telehealth: Payer: Self-pay | Admitting: Cardiovascular Disease

## 2020-05-20 NOTE — Telephone Encounter (Signed)
Received fax from [facility]  Patient's BP reading for 05/20/20 126/73 HR 78 Placed in nurse box

## 2020-05-20 NOTE — Telephone Encounter (Signed)
Paper received on a piece of plain paper with only patient information and BP/HR as stated previously.  Per Office visit AVS from 05/18/20 with Dr Rockey Situ: Please fax latest blood work, kidney function:  [phone] 2) On 05/25/20 please start checking blood pressures once daily x 1 week and call [phone] or fax [phone] results.    Will plan to call [name] tomorrow to let them know what is needed.

## 2020-05-25 ENCOUNTER — Other Ambulatory Visit: Payer: Self-pay

## 2020-05-25 NOTE — Telephone Encounter (Signed)
Reviewed the patient's chart. Will need to call [name] on 06/01/20 to see if they have taken the patient's BP x 1 week. They were supposed to start doing this today.

## 2020-05-26 ENCOUNTER — Telehealth (INDEPENDENT_AMBULATORY_CARE_PROVIDER_SITE_OTHER): Payer: Medicare PPO | Admitting: Gastroenterology

## 2020-05-26 ENCOUNTER — Encounter: Payer: Self-pay | Admitting: Gastroenterology

## 2020-05-26 DIAGNOSIS — K6289 Other specified diseases of anus and rectum: Secondary | ICD-10-CM

## 2020-05-26 NOTE — Telephone Encounter (Signed)
Fax received from [facility] stating the patient's BP today is 127/75.   [facility] [place] [facility] [name] Phone: [phone] Fax: [phone]  To Dr. Rockey Situ to review.

## 2020-05-26 NOTE — Telephone Encounter (Signed)
BP good, would continue to hold/stop amlodipine

## 2020-05-27 NOTE — Progress Notes (Signed)
Brandi Antigua, MD [address]  Brandi Hanson  Brandi Hanson, [place] [address]  Main: [phone]  Fax: [phone]   Primary Care Physician: Pcp, No  Virtual Visit via Telephone Note  I connected with patient on 05/27/20 at  3:15 PM EDT by telephone and verified that I am speaking with the correct person using two identifiers.   I discussed the limitations, risks, security and privacy concerns of performing an evaluation and management service by telephone and the availability of in person appointments. I also discussed with the patient that there may be a patient responsible charge related to this service. The patient expressed understanding and agreed to proceed.  Location of Patient: Home Location of Provider: Home Persons involved: Patient, Brandi Hanson patient sister and power of attorney, Brandi Hanson patient's nephew, and provider only during the visit (nursing staff and front desk staff was involved in communicating with the patient prior to the appointment, reviewing medications and checking them in)   History of Present Illness: Chief Complaint  Patient presents with  . Proctitis     HPI: Brandi Hanson is a 84 y.o. female who was recently found to have rectal wall thickening on a CT scan done for other reasons.  Incidental finding.  Patient is not having any symptoms.  On last visit, we extensively discussed with patient and her family, her power of attorney and another family member present at this time what her options would entail.  They have discussed this further amongst the family members, and they would not like endoscopic interventions at this time.  Patient is not having any symptoms of chest pain, altered bowel habits, blood in stool, constipation or diarrhea.  Current Outpatient Medications  Medication Sig Dispense Refill  . acetaminophen (TYLENOL) 325 MG tablet Take 650 mg by mouth in the morning and at bedtime.     Marland Kitchen acetaminophen (TYLENOL) 500 MG tablet Take 500 mg by  mouth every 6 (six) hours as needed for mild pain or fever.    . busPIRone (BUSPAR) 5 MG tablet Take 5 mg by mouth 2 (two) times daily.     . Calcium Carb-Cholecalciferol (CALCIUM 600+D) 600-800 MG-UNIT TABS Take 1 tablet by mouth 2 (two) times daily.     . Cholecalciferol (VITAMIN D) 50 MCG (2000 UT) CAPS Take 2,000 Units by mouth daily.    . cloNIDine (CATAPRES) 0.1 MG tablet Take 1 tablet (0.1 mg total) by mouth daily as needed. 60 tablet   . donepezil (ARICEPT) 10 MG tablet TAKE 1 TABLET BY MOUTH AT BEDTIME (Patient taking differently: Take 10 mg by mouth daily. ) 90 tablet 3  . fluticasone (FLONASE) 50 MCG/ACT nasal spray Place 1 spray into both nostrils daily.     . hydrochlorothiazide (HYDRODIURIL) 25 MG tablet Take 1 tablet by mouth daily.    Marland Kitchen loratadine (CLARITIN) 10 MG tablet Take 10 mg by mouth daily.    Marland Kitchen LORazepam (ATIVAN) 0.5 MG tablet Take 0.5 tablets (0.25 mg total) by mouth 2 (two) times daily as needed for anxiety or sleep. 6 tablet 0  . melatonin 3 MG TABS tablet Take 3 mg by mouth at bedtime.    . memantine (NAMENDA) 10 MG tablet Take 10 mg by mouth 2 (two) times daily.    . ondansetron (ZOFRAN) 8 MG tablet Take 8 mg by mouth every 8 (eight) hours as needed for nausea or vomiting.    . polyethylene glycol (MIRALAX / GLYCOLAX) 17 g packet Take 17 g by mouth  daily. 14 each 0  . pravastatin (PRAVACHOL) 20 MG tablet Take 1 tablet by mouth daily.    . propranolol (INDERAL) 20 MG tablet Take 1 tablet (20 mg total) by mouth 2 (two) times daily. 360 tablet 4  . Propylene Glycol (SYSTANE COMPLETE OP) Place 1 drop into both eyes in the morning and at bedtime.     . risperiDONE (RISPERDAL) 0.25 MG tablet Take 0.25 mg by mouth at bedtime.    . sertraline (ZOLOFT) 100 MG tablet Take 150 mg by mouth daily.    . vitamin B-12 1000 MCG tablet Take 1 tablet (1,000 mcg total) by mouth daily. 90 tablet 0   No current facility-administered medications for this visit.    Allergies as of  05/26/2020 - Review Complete 05/26/2020  Allergen Reaction Noted  . Sulfa antibiotics Nausea Only 08/18/2013    Review of Systems:    All systems reviewed and negative except where noted in HPI.   Observations/Objective:  Labs: CMP     Component Value Date/Time   NA 140 02/10/2020 1100   NA 144 11/22/2017 0911   NA 141 01/20/2016 1035   K 4.0 02/10/2020 1100   K 4.3 01/20/2016 1035   CL 101 02/10/2020 1100   CL 105 07/02/2013 1525   CL 105 12/31/2012 1032   CO2 29 02/10/2020 1100   CO2 30 (H) 01/20/2016 1035   GLUCOSE 93 02/10/2020 1100   GLUCOSE 101 01/20/2016 1035   GLUCOSE 101 (H) 12/31/2012 1032   BUN 16 02/10/2020 1100   BUN 17 11/22/2017 0911   BUN 20.7 01/20/2016 1035   CREATININE 0.66 02/10/2020 1100   CREATININE 0.7 01/20/2016 1035   CALCIUM 9.0 02/10/2020 1100   CALCIUM 9.5 01/20/2016 1035   PROT 6.1 (L) 01/07/2020 1841   PROT 5.9 (L) 11/22/2017 0911   PROT 6.4 01/20/2016 1035   ALBUMIN 3.2 (L) 01/07/2020 1841   ALBUMIN 4.1 11/22/2017 0911   ALBUMIN 3.6 01/20/2016 1035   AST 19 01/07/2020 1841   AST 13 01/20/2016 1035   ALT 15 01/07/2020 1841   ALT 12 01/20/2016 1035   ALKPHOS 66 01/07/2020 1841   ALKPHOS 75 01/20/2016 1035   BILITOT 0.5 01/07/2020 1841   BILITOT 0.3 11/22/2017 0911   BILITOT 0.47 01/20/2016 1035   GFRNONAA >60 02/10/2020 1100   GFRNONAA >60 07/02/2013 1525   GFRAA >60 02/10/2020 1100   GFRAA >60 07/02/2013 1525   Lab Results  Component Value Date   WBC 6.8 02/10/2020   HGB 10.8 (L) 02/10/2020   HCT 33.8 (L) 02/10/2020   MCV 89.2 02/10/2020   PLT 189 02/10/2020    Imaging Studies: No results found.  Assessment and Plan:   [name] is a 84 y.o. y/o female with rectal thickening seen incidentally on CT scan done for other reasons, with patient otherwise asymptomatic  Assessment and Plan: I again had an extensive discussion with the patient family members mentioned above and discussed risks and benefits of all  available options.  They would like to avoid any endoscopic procedures unless necessitated by active symptoms.  In addition to colonoscopy, we also did discuss the option of an unsedated unprepped, enema prep only flexible sigmoidoscopy, and they declined this procedure as well.  They did ask if fecal occult blood testing would be an alternative option.  However, I discussed with them that this would not be useful as if it is negative, it does not change the CT finding, and if it is  positive, she still would need further evaluation just like she does now.  I discussed with them that a noninvasive option that might be helpful but not as definitive as visualization with endoscopy, may be a repeat CT scan (abdomen pelvis with contrast and rectal contrast) to evaluate if the thickening is still present.  They state they may consider this option but would discuss it again with family and if they would like to go through with that they will reach out to [place] themselves.  At this time they do not want to schedule any further testing  Follow Up Instructions:    I discussed the assessment and treatment plan with the patient. The patient was provided an opportunity to ask questions and all were answered. The patient agreed with the plan and demonstrated an understanding of the instructions.   The patient was advised to call back or seek an in-person evaluation if the symptoms worsen or if the condition fails to improve as anticipated.  I provided 22 minutes of non-face-to-face time during this encounter. Additional time was spent in reviewing patient's chart, placing orders etc.   Virgel Manifold, MD  Speech recognition software was used to dictate this note.

## 2020-05-27 NOTE — Telephone Encounter (Signed)
This encounter printed and faxed to [facility] with instructions to stop amlodipine.

## 2020-05-31 ENCOUNTER — Telehealth: Payer: Self-pay

## 2020-05-31 NOTE — Telephone Encounter (Signed)
Called Mr. Brandi Hanson but had to leave him a voicemail letting know to please call us back once they decided on what to do for her aunt if they agreed on getting a CT Scan or not.  If they agree on CT Scan, Dr. Bonna Gains would want a CT Abdomen and Pelvis w/ contrast with rectal contrast on comments. Diagnosis: Rectal thickening.

## 2020-06-02 NOTE — Telephone Encounter (Signed)
Called [facility] again and left him a voicemail to call us back if they decided on scheduling a CT Scan for her aunt.

## 2020-06-21 ENCOUNTER — Ambulatory Visit: Payer: Medicare PPO | Admitting: Cardiovascular Disease

## 2020-06-25 ENCOUNTER — Other Ambulatory Visit: Payer: Self-pay | Admitting: Adult Health

## 2020-06-25 DIAGNOSIS — Z1231 Encounter for screening mammogram for malignant neoplasm of breast: Secondary | ICD-10-CM

## 2020-06-28 ENCOUNTER — Other Ambulatory Visit: Payer: Self-pay | Admitting: Adult Health

## 2020-06-28 DIAGNOSIS — M810 Age-related osteoporosis without current pathological fracture: Secondary | ICD-10-CM

## 2020-07-29 ENCOUNTER — Ambulatory Visit: Payer: Medicare PPO | Admitting: Dermatology

## 2020-08-10 ENCOUNTER — Ambulatory Visit
Admission: RE | Admit: 2020-08-10 | Discharge: 2020-08-10 | Disposition: A | Discharge status: Home / Self Care | Payer: Medicare PPO | Source: Ambulatory Visit | Attending: Adult Health | Admitting: Adult Health

## 2020-08-10 ENCOUNTER — Other Ambulatory Visit: Payer: Self-pay

## 2020-08-10 DIAGNOSIS — M810 Age-related osteoporosis without current pathological fracture: Secondary | ICD-10-CM | POA: Diagnosis not present

## 2020-08-10 DIAGNOSIS — Z1231 Encounter for screening mammogram for malignant neoplasm of breast: Secondary | ICD-10-CM

## 2020-08-10 HISTORY — DX: Personal history of irradiation: Z92.3

## 2021-03-24 ENCOUNTER — Other Ambulatory Visit: Payer: Self-pay | Admitting: *Deleted

## 2021-03-24 DIAGNOSIS — N201 Calculus of ureter: Secondary | ICD-10-CM

## 2021-03-25 ENCOUNTER — Ambulatory Visit
Admission: RE | Admit: 2021-03-25 | Discharge: 2021-03-25 | Disposition: A | Discharge status: Home / Self Care | Payer: Medicare PPO | Source: Ambulatory Visit | Attending: Urology | Admitting: Urology

## 2021-03-25 ENCOUNTER — Encounter: Payer: Self-pay | Admitting: Urology

## 2021-03-25 ENCOUNTER — Ambulatory Visit: Payer: Medicare PPO | Admitting: Urology

## 2021-03-25 ENCOUNTER — Other Ambulatory Visit: Payer: Self-pay

## 2021-03-25 VITALS — BP 131/83 | HR 72 | Ht 62.0 in | Wt 125.0 lb

## 2021-03-25 DIAGNOSIS — N201 Calculus of ureter: Secondary | ICD-10-CM | POA: Diagnosis present

## 2021-03-25 DIAGNOSIS — N200 Calculus of kidney: Secondary | ICD-10-CM | POA: Diagnosis not present

## 2021-03-25 NOTE — Progress Notes (Signed)
03/25/2021 11:42 AM   Brandi Hanson 03/20/1936 [id number]  Referring provider: No referring provider defined for this encounter.  Chief Complaint  Patient presents with   Nephrolithiasis    Urologic history: 1.  Bilateral renal calculi Nonobstructing renal calculi CT 01/2020 Sepsis secondary to 8 mm left proximal ureteral calculus 5 2021; stent placement with ureteroscopic removal 02/10/2020 Pyeloscopy only showed a partially embedded calculus in a midpole renal papilla which was removed Follow-up renal ultrasound July 2021 without hydronephrosis   HPI: 85 y.o. female presents for follow-up.  States she is doing well and denies flank, abdominal pain Was treated for a UTI this spring with resolution with antibiotics Denies dysuria, gross hematuria   PMH: Past Medical History:  Diagnosis Date   Abdominal distention    Abdominal pain    Anxiety    Arthritis    Bacteremia due to Gram-negative bacteria 01/08/2020   Benign essential HTN 01/02/2012   Breast cancer ([place]) 2012   left breast   Breast cancer in situ 07/22/2011   Cancer ([place])    skin   Confusion    Depression    Generalized headaches    GERD (gastroesophageal reflux disease)    Hyperlipidemia    Hyperlipidemia 01/02/2012   Hypertension    Memory loss    Osteoporosis    Palpitations    Personal history of radiation therapy    PVC (premature ventricular contraction)    Rectal pain    Sleep apnea     Surgical History: Past Surgical History:  Procedure Laterality Date   [place], 1994   left   BREAST BIOPSY Left 2013   breast cancer  10/2010   ductal carcinoma in situ, left breast   CATARACT EXTRACTION  2011   bilateral   CYSTOSCOPY W/ RETROGRADES Left 02/10/2020   Procedure: CYSTOSCOPY WITH RETROGRADE PYELOGRAM;  Surgeon: Abbie Sons, MD;  Location: [facility] ORS;  Service: Urology;  Laterality: Left;   CYSTOSCOPY WITH STENT PLACEMENT Left 01/12/2020    Procedure: CYSTOSCOPY WITH STENT PLACEMENT;  Surgeon: Abbie Sons, MD;  Location: [facility] OR[facility];  Service: Urology;  Laterality: Left;   CYSTOSCOPY/URETEROSCOPY/HOLMIUM LASER/STENT PLACEMENT Left 02/10/2020   Procedure: CYSTOSCOPY/URETEROSCOPY/HOLMIUM LASER/STENT Exchange;  Surgeon: Abbie Sons, MD;  Location: [facility] ORS;  Service: Urology;  Laterality: Left;   FOOT SURGERY  2005   KIDNEY STONE SURGERY  2011   ROTATOR CUFF REPAIR  2007    Home Medications:  Allergies as of 03/25/2021       Reactions   Sulfa Antibiotics Nausea Only        Medication List        Accurate as of March 25, 2021 11:42 AM. If you have any questions, ask your nurse or doctor.          acetaminophen 500 MG tablet Commonly known as: TYLENOL Take 500 mg by mouth every 6 (six) hours as needed for mild pain or fever.   acetaminophen 325 MG tablet Commonly known as: TYLENOL Take 650 mg by mouth in the morning and at bedtime.   busPIRone 5 MG tablet Commonly known as: BUSPAR Take 5 mg by mouth 2 (two) times daily.   Calcium 600+D 600-800 MG-UNIT Tabs Generic drug: Calcium Carb-Cholecalciferol Take 1 tablet by mouth 2 (two) times daily.   cloNIDine 0.1 MG tablet Commonly known as: CATAPRES Take 1 tablet (0.1 mg total) by mouth daily as needed.   cyanocobalamin 1000 MCG  tablet Take 1 tablet (1,000 mcg total) by mouth daily.   donepezil 10 MG tablet Commonly known as: ARICEPT TAKE 1 TABLET BY MOUTH AT BEDTIME What changed: when to take this   fluticasone 50 MCG/ACT nasal spray Commonly known as: FLONASE Place 1 spray into both nostrils daily.   hydrochlorothiazide 25 MG tablet Commonly known as: HYDRODIURIL Take 1 tablet by mouth daily.   loratadine 10 MG tablet Commonly known as: CLARITIN Take 10 mg by mouth daily.   LORazepam 0.5 MG tablet Commonly known as: ATIVAN Take 0.5 tablets (0.25 mg total) by mouth 2 (two) times daily as needed for anxiety or sleep.   melatonin 3 MG Tabs  tablet Take 3 mg by mouth at bedtime.   memantine 10 MG tablet Commonly known as: NAMENDA Take 10 mg by mouth 2 (two) times daily.   ondansetron 8 MG tablet Commonly known as: ZOFRAN Take 8 mg by mouth every 8 (eight) hours as needed for nausea or vomiting.   polyethylene glycol 17 g packet Commonly known as: MIRALAX / GLYCOLAX Take 17 g by mouth daily.   pravastatin 20 MG tablet Commonly known as: PRAVACHOL Take 1 tablet by mouth daily.   propranolol 20 MG tablet Commonly known as: INDERAL Take 1 tablet (20 mg total) by mouth 2 (two) times daily.   risperiDONE 0.25 MG tablet Commonly known as: RISPERDAL Take 0.25 mg by mouth at bedtime.   sertraline 100 MG tablet Commonly known as: ZOLOFT Take 150 mg by mouth daily.   SYSTANE COMPLETE OP Place 1 drop into both eyes in the morning and at bedtime.   Vitamin D 50 MCG (2000 UT) Caps Take 2,000 Units by mouth daily.        Allergies:  Allergies  Allergen Reactions   Sulfa Antibiotics Nausea Only    Family History: Family History  Problem Relation Age of Onset   Cancer Mother        breast   Crohn's disease Mother    Breast cancer Mother    Cancer Father        lung   Hypertension Father    Cancer Sister        breast and uterine   Hypertension Sister    Breast cancer Sister    Heart disease Maternal Grandmother    Cancer Maternal Grandfather        colon   Hypertension Maternal Grandfather    Heart disease Paternal Grandmother    Heart disease Paternal Grandfather     Social History:  reports that she has never smoked. She has never used smokeless tobacco. She reports that she does not drink alcohol and does not use drugs.   Physical Exam: BP 131/83   Pulse 72   Ht 5\' 2"  (1.575 m)   Wt 125 lb (56.7 kg)   BMI 22.86 kg/m   Constitutional:  Alert, No acute distress. HEENT: [place] AT, moist mucus membranes.  Trachea midline, no masses. Cardiovascular: No clubbing, cyanosis, or edema. Respiratory:  Normal respiratory effort, no increased work of breathing. Skin: No rashes, bruises or suspicious lesions. Neurologic: Grossly intact, no focal deficits, moving all 4 extremities. Psychiatric: Normal mood and affect.   Pertinent Imaging: KUB performed today.  Images were personally reviewed and interpreted.  Stable calcifications overlying the right renal outline.  Significant stool and bowel gas overlying the left renal outline  Assessment & Plan:    1.  Nephrolithiasis Stable right renal calculi 1 year follow-up with KUB To  call earlier for renal colic/stone symptoms   Abbie Sons, MD  [facility] [address], [place], [place] [address] [phone]

## 2021-03-26 ENCOUNTER — Encounter: Payer: Self-pay | Admitting: Urology

## 2021-05-22 NOTE — Progress Notes (Signed)
Cardiology Office Note  Date:  05/23/2021   ID:  Brandi Hanson, DOB [birth year], MRN [id number]  PCP:  Pcp, No   Chief Complaint  Patient presents with   12 month follow up     "Doing well." Medications reviewed by the patient's medication list from [facility].     HPI:  [name] is a 85 year old woman with past medical history of Prior history of sepsis secondary to UTI Alzheimer's dementia Chronic diarrhea Who presents for f/u of her leg swelling, diastolic dysfunction  Lives at [facility] clinic visit September 2021  No complaints on today's visit On prior office visit had leg swelling  Prior work-up for leg edema Echo with EF 60% Normal rvsp No significant valvular disease  Amlodipine held Swelling better  Remains on propranolol 20 twice daily, HCTZ 25 daily Difficulty standing to get on the scale in the office today  Other medications include Clonidine 0.1 daily as needed for high blood pressure over 180  She denies any complaints, denies any shortness of breath or chest discomfort on exertion  EKG personally reviewed by myself on todays visit Shows normal sinus rhythm rate 70 bpm no significant ST-T wave changes   PMH:   has a past medical history of Abdominal distention, Abdominal pain, Anxiety, Arthritis, Bacteremia due to Gram-negative bacteria (01/08/2020), Benign essential HTN (01/02/2012), Breast cancer ([place]) (2012), Breast cancer in situ (07/22/2011), Cancer ([place]), Confusion, Depression, Generalized headaches, GERD (gastroesophageal reflux disease), Hyperlipidemia, Hyperlipidemia (01/02/2012), Hypertension, Memory loss, Osteoporosis, Palpitations, Personal history of radiation therapy, PVC (premature ventricular contraction), Rectal pain, and Sleep apnea.  PSH:    Past Surgical History:  Procedure Laterality Date   [place]   left   BREAST BIOPSY Left 2013   breast cancer   10/2010   ductal carcinoma in situ, left breast   CATARACT EXTRACTION  2011   bilateral   CYSTOSCOPY W/ RETROGRADES Left 02/10/2020   Procedure: CYSTOSCOPY WITH RETROGRADE PYELOGRAM;  Surgeon: Abbie Sons, MD;  Location: [facility];  Service: Urology;  Laterality: Left;   CYSTOSCOPY WITH STENT PLACEMENT Left 01/12/2020   Procedure: CYSTOSCOPY WITH STENT PLACEMENT;  Surgeon: Abbie Sons, MD;  Location: [facility];  Service: Urology;  Laterality: Left;   CYSTOSCOPY/URETEROSCOPY/HOLMIUM LASER/STENT PLACEMENT Left 02/10/2020   Procedure: CYSTOSCOPY/URETEROSCOPY/HOLMIUM LASER/STENT Exchange;  Surgeon: Abbie Sons, MD;  Location: [facility];  Service: Urology;  Laterality: Left;   FOOT SURGERY  2005   KIDNEY STONE SURGERY  2011   ROTATOR CUFF REPAIR  2007    Current Outpatient Medications  Medication Sig Dispense Refill   acetaminophen (TYLENOL) 325 MG tablet Take 650 mg by mouth in the morning and at bedtime.      acetaminophen (TYLENOL) 500 MG tablet Take 500 mg by mouth every 6 (six) hours as needed for mild pain or fever.     busPIRone (BUSPAR) 5 MG tablet Take 5 mg by mouth 2 (two) times daily.      Calcium Carb-Cholecalciferol (CALCIUM 600+D) 600-800 MG-UNIT TABS Take 1 tablet by mouth 2 (two) times daily.      Cholecalciferol (VITAMIN D) 50 MCG (2000 UT) CAPS Take 2,000 Units by mouth daily.     donepezil (ARICEPT) 10 MG tablet TAKE 1 TABLET BY MOUTH AT BEDTIME (Patient taking differently: Take 10 mg by mouth daily.) 90 tablet 3   famotidine (PEPCID) 20 MG tablet Take 20 mg by mouth at  bedtime.     fluticasone (FLONASE) 50 MCG/ACT nasal spray Place 1 spray into both nostrils daily.      hydrochlorothiazide (HYDRODIURIL) 25 MG tablet Take 1 tablet by mouth daily.     loratadine (CLARITIN) 10 MG tablet Take 10 mg by mouth daily.     LORazepam (ATIVAN) 0.5 MG tablet Take 0.5 tablets (0.25 mg total) by mouth 2 (two) times daily as needed for anxiety or sleep. 6 tablet 0   melatonin 3 MG  TABS tablet Take 3 mg by mouth at bedtime.     memantine (NAMENDA) 10 MG tablet Take 10 mg by mouth 2 (two) times daily.     polyethylene glycol (MIRALAX / GLYCOLAX) 17 g packet Take 17 g by mouth daily. 14 each 0   pravastatin (PRAVACHOL) 20 MG tablet Take 1 tablet by mouth daily.     propranolol (INDERAL) 20 MG tablet Take 1 tablet (20 mg total) by mouth 2 (two) times daily. 360 tablet 4   Propylene Glycol (SYSTANE COMPLETE OP) Place 1 drop into both eyes in the morning and at bedtime.      risperiDONE (RISPERDAL) 0.25 MG tablet Take 0.25 mg by mouth at bedtime.     sertraline (ZOLOFT) 100 MG tablet Take 150 mg by mouth daily.     vitamin B-12 1000 MCG tablet Take 1 tablet (1,000 mcg total) by mouth daily. 90 tablet 0   cloNIDine (CATAPRES) 0.1 MG tablet Take 1 tablet (0.1 mg total) by mouth daily as needed. (Patient not taking: Reported on 05/23/2021) 60 tablet    ondansetron (ZOFRAN) 8 MG tablet Take 8 mg by mouth every 8 (eight) hours as needed for nausea or vomiting. (Patient not taking: Reported on 05/23/2021)     No current facility-administered medications for this visit.     Allergies:   Sulfa antibiotics   Social History:  The patient  reports that she has never smoked. She has never used smokeless tobacco. She reports that she does not drink alcohol and does not use drugs.   Family History:   family history includes Breast cancer in her mother and sister; Cancer in her father, maternal grandfather, mother, and sister; Crohn's disease in her mother; Heart disease in her maternal grandmother, paternal grandfather, and paternal grandmother; Hypertension in her father, maternal grandfather, and sister.    Review of Systems: Review of Systems  Constitutional: Negative.   HENT: Negative.    Respiratory: Negative.    Cardiovascular: Negative.   Gastrointestinal: Negative.   Musculoskeletal: Negative.   Neurological: Negative.   Psychiatric/Behavioral:  Positive for memory loss.    All other systems reviewed and are negative.   PHYSICAL EXAM: VS:  BP 102/60 (BP Location: Left Arm, Patient Position: Sitting, Cuff Size: Normal)   Pulse 70   Ht '5\' 7"'$  (1.702 m)   SpO2 98%   BMI 19.58 kg/m  , BMI Body mass index is 19.58 kg/m. Constitutional:  oriented to person, place, and time. No distress.  HENT:  Head: Grossly normal Eyes:  no discharge. No scleral icterus.  Neck: No JVD, no carotid bruits  Cardiovascular: Regular rate and rhythm, no murmurs appreciated Pulmonary/Chest: Clear to auscultation bilaterally, no wheezes or rails Abdominal: Soft.  no distension.  no tenderness.  Musculoskeletal: Normal range of motion Neurological:  normal muscle tone. Coordination normal. No atrophy Skin: Skin warm and dry Psychiatric: normal affect, pleasant   Recent Labs: No results found for requested labs within last 8760 hours.    Lipid  Panel Lab Results  Component Value Date   CHOL 255 (H) 11/28/2016   HDL 51 11/28/2016   LDLCALC 164 (H) 11/28/2016   TRIG 202 (H) 11/28/2016      Wt Readings from Last 3 Encounters:  03/25/21 125 lb (56.7 kg)  05/18/20 122 lb (55.3 kg)  04/27/20 124 lb 3.2 oz (56.3 kg)       ASSESSMENT AND PLAN:  Problem List Items Addressed This Visit       Cardiology Problems   Aortic atherosclerosis ([place])   Relevant Orders   EKG 12-Lead   Carotid artery obstruction   Other Visit Diagnoses     PAD (peripheral artery disease) ([place])    -  Primary   Relevant Orders   EKG 12-Lead   Leg swelling       Essential hypertension         Leg swelling Likely venous insufficiency, possibly exacerbated by amlodipine 10 mg daily started in July 2021 Symptoms have improved without the amlodipine  Essential hypertension On propranolol 20 twice daily, HCTZ 25 daily Blood pressure low today, Recommend blood pressures be reviewed from [facility], if running low he may be able to wean down on HCTZ in half or stop the medication  altogether -Will need periodic potassium check if staying on HCTZ  Diastolic dysfunction Appears euvolemic on today's visit, no swelling   Disposition:   F/U as needed   Total encounter time more than 25 minutes  Greater than 50% was spent in counseling and coordination of care with the patient    Signed, Esmond Plants, M.D., Ph.D. [place], [place]

## 2021-05-23 ENCOUNTER — Ambulatory Visit: Payer: Medicare PPO | Admitting: Cardiovascular Disease

## 2021-05-23 ENCOUNTER — Other Ambulatory Visit: Payer: Self-pay

## 2021-05-23 ENCOUNTER — Encounter: Payer: Self-pay | Admitting: Cardiovascular Disease

## 2021-05-23 VITALS — BP 102/60 | HR 70 | Ht 67.0 in

## 2021-05-23 DIAGNOSIS — I739 Peripheral vascular disease, unspecified: Secondary | ICD-10-CM

## 2021-05-23 DIAGNOSIS — I700 Atherosclerosis of aorta: Secondary | ICD-10-CM

## 2021-05-23 DIAGNOSIS — I6521 Occlusion and stenosis of right carotid artery: Secondary | ICD-10-CM

## 2021-05-23 DIAGNOSIS — I10 Essential (primary) hypertension: Secondary | ICD-10-CM

## 2021-05-23 DIAGNOSIS — M7989 Other specified soft tissue disorders: Secondary | ICD-10-CM | POA: Diagnosis not present

## 2021-05-23 NOTE — Patient Instructions (Addendum)
Medication Instructions:  No changes  Need to review blood pressures, If BP not elevated, may be able to wean down or off the HCTZ (leg swelling resolved) (Recent Potassium level)  If you need a refill on your cardiac medications before your next appointment, please call your pharmacy.    Lab work: No new labs needed  Testing/Procedures: No new testing needed   Follow-Up: At [facility], you and your health needs are our priority.  As part of our continuing mission to provide you with exceptional heart care, we have created designated Provider Care Teams.  These Care Teams include your primary Cardiologist (physician) and Advanced Practice Providers (APPs -  Physician Assistants and Nurse Practitioners) who all work together to provide you with the care you need, when you need it.  You will need a follow up appointment as needed  Providers on your designated Care Team:   Murray Hodgkins, NP Christell Faith, PA-C Marrianne Mood, PA-C Cadence Moulton, [place]   COVID-19 Vaccine Information can be found at: ShippingScam.co.uk For questions related to vaccine distribution or appointments, please email [email or web address] .[email or web address] or call [phone].

## 2021-07-06 ENCOUNTER — Other Ambulatory Visit: Payer: Self-pay | Admitting: Adult Health

## 2021-07-06 DIAGNOSIS — Z1231 Encounter for screening mammogram for malignant neoplasm of breast: Secondary | ICD-10-CM

## 2021-08-11 ENCOUNTER — Other Ambulatory Visit: Payer: Self-pay

## 2021-08-11 ENCOUNTER — Ambulatory Visit
Admission: RE | Admit: 2021-08-11 | Discharge: 2021-08-11 | Disposition: A | Discharge status: Home / Self Care | Payer: Medicare PPO | Source: Ambulatory Visit | Attending: Adult Health | Admitting: Adult Health

## 2021-08-11 DIAGNOSIS — Z1231 Encounter for screening mammogram for malignant neoplasm of breast: Secondary | ICD-10-CM | POA: Insufficient documentation

## 2021-08-23 ENCOUNTER — Encounter: Payer: Self-pay | Admitting: Urology

## 2021-12-27 ENCOUNTER — Emergency Department
Admission: EM | Admit: 2021-12-27 | Discharge: 2021-12-27 | Disposition: A | Discharge status: Skilled Nursing Facility | Payer: Medicare PPO | Attending: Emergency Medicine | Admitting: Emergency Medicine

## 2021-12-27 ENCOUNTER — Emergency Department: Payer: Medicare PPO

## 2021-12-27 ENCOUNTER — Other Ambulatory Visit: Payer: Self-pay

## 2021-12-27 DIAGNOSIS — L89211 Pressure ulcer of right hip, stage 1: Secondary | ICD-10-CM | POA: Insufficient documentation

## 2021-12-27 DIAGNOSIS — G309 Alzheimer's disease, unspecified: Secondary | ICD-10-CM | POA: Diagnosis not present

## 2021-12-27 DIAGNOSIS — L89201 Pressure ulcer of unspecified hip, stage 1: Secondary | ICD-10-CM

## 2021-12-27 DIAGNOSIS — L089 Local infection of the skin and subcutaneous tissue, unspecified: Secondary | ICD-10-CM | POA: Diagnosis present

## 2021-12-27 LAB — CBC WITH DIFFERENTIAL/PLATELET
Abs Immature Granulocytes: 0.03 10*3/uL (ref 0.00–0.07)
Basophils Absolute: 0 10*3/uL (ref 0.0–0.1)
Basophils Relative: 0 %
Eosinophils Absolute: 0.1 10*3/uL (ref 0.0–0.5)
Eosinophils Relative: 1 %
HCT: 37.8 % (ref 36.0–46.0)
Hemoglobin: 12.3 g/dL (ref 12.0–15.0)
Immature Granulocytes: 0 %
Lymphocytes Relative: 18 %
Lymphs Abs: 1.3 10*3/uL (ref 0.7–4.0)
MCH: 29.9 pg (ref 26.0–34.0)
MCHC: 32.5 g/dL (ref 30.0–36.0)
MCV: 91.7 fL (ref 80.0–100.0)
Monocytes Absolute: 0.7 10*3/uL (ref 0.1–1.0)
Monocytes Relative: 10 %
Neutro Abs: 4.8 10*3/uL (ref 1.7–7.7)
Neutrophils Relative %: 71 %
Platelets: 156 10*3/uL (ref 150–400)
RBC: 4.12 MIL/uL (ref 3.87–5.11)
RDW: 13.1 % (ref 11.5–15.5)
WBC: 6.9 10*3/uL (ref 4.0–10.5)
nRBC: 0 % (ref 0.0–0.2)

## 2021-12-27 LAB — COMPREHENSIVE METABOLIC PANEL
ALT: 14 U/L (ref 0–44)
AST: 19 U/L (ref 15–41)
Albumin: 3.6 g/dL (ref 3.5–5.0)
Alkaline Phosphatase: 71 U/L (ref 38–126)
Anion gap: 8 (ref 5–15)
BUN: 23 mg/dL (ref 8–23)
CO2: 33 mmol/L — ABNORMAL HIGH (ref 22–32)
Calcium: 9.5 mg/dL (ref 8.9–10.3)
Chloride: 102 mmol/L (ref 98–111)
Creatinine, Ser: 0.71 mg/dL (ref 0.44–1.00)
GFR, Estimated: >60 mL/min (ref >60)
Glucose, Bld: 119 mg/dL — ABNORMAL HIGH (ref 70–99)
Potassium: 3.9 mmol/L (ref 3.5–5.1)
Sodium: 143 mmol/L (ref 135–145)
Total Bilirubin: 0.4 mg/dL (ref 0.3–1.2)
Total Protein: 6.4 g/dL — ABNORMAL LOW (ref 6.5–8.1)

## 2021-12-27 LAB — LACTIC ACID, PLASMA: Lactic Acid, Venous: 1.2 mmol/L (ref 0.5–1.9)

## 2021-12-27 NOTE — ED Notes (Signed)
[facility]  CALLED  FOR  TRANSPORT 

## 2021-12-27 NOTE — ED Notes (Signed)
This tech and [employer], [place] applied Mepilex pad on R hip wound.  ?

## 2021-12-27 NOTE — ED Provider Notes (Signed)
? ?  [facility] ?Provider Note ? ? ? Event Date/Time  ? First MD Initiated Contact with Patient 12/27/21 1333   ?  (approximate) ? ? ?History  ? ?Wound Infection ? ? ?HPI ? ?Brandi Hanson is a 86 y.o. female presents emergency department via EMS from [facility].  Patient's family states that they were told she has an infected wound.  They would like to have it evaluated.  Patient has severe Alzheimer's. ? ?  ? ? ?Physical Exam  ? ?Triage Vital Signs: ?ED Triage Vitals  ?Enc Vitals Group  ?   BP   ?   Pulse   ?   Resp   ?   Temp   ?   Temp src   ?   SpO2   ?   Weight   ?   Height   ?   Head Circumference   ?   Peak Flow   ?   Pain Score   ?   Pain Loc   ?   Pain Edu?   ?   Excl. in [place]?   ? ? ?Most recent vital signs: ?There were no vitals filed for this visit. ? ? ?General: Awake, no distress.   ?CV:  Good peripheral perfusion. regular rate and  rhythm ?Resp:  Normal effort.  ?Abd:  No distention.   ?Other:  Pressure sore noted on the right hip, states she tripped while, no weeping or drainage noted. ? ? ?ED Results / Procedures / Treatments  ? ?Labs ?(all labs ordered are listed, but only abnormal results are displayed) ?Labs Reviewed  ?COMPREHENSIVE METABOLIC PANEL - Abnormal; Notable for the following components:  ?    Result Value  ? CO2 33 (*)   ? Glucose, Bld 119 (*)   ? Total Protein 6.4 (*)   ? All other components within normal limits  ?LACTIC ACID, PLASMA  ?CBC WITH DIFFERENTIAL/PLATELET  ?LACTIC ACID, PLASMA  ?URINALYSIS, ROUTINE W REFLEX MICROSCOPIC  ? ? ? ?EKG ? ? ? ? ?RADIOLOGY ? ? ? ? ?PROCEDURES: ? ? ?Procedures ? ? ?MEDICATIONS ORDERED IN ED: ?Medications - No data to display ? ? ?IMPRESSION / MDM / ASSESSMENT AND PLAN / ED COURSE  ?I reviewed the triage vital signs and the nursing notes. ?             ?               ? ?Differential diagnosis includes, but is not limited to, wound infection, sepsis, pressure ulcer ? ?Patient's labs are reassuring, CBC, metabolic panel and  lactic acid are all normal ? ?Due to the superficial area being eroded we placed a dressing on the area which should be changed every 24 hours.  Would watch closely for infection.  Do not feel patient needs antibiotic at this time.  Family is in agreement treatment plan.  She was discharged stable condition. ? ? ? ? ?  ? ? ?FINAL CLINICAL IMPRESSION(S) / ED DIAGNOSES  ? ?Final diagnoses:  ?Pressure injury of hip, stage 1, unspecified laterality  ? ? ? ?Rx / DC Orders  ? ?ED Discharge Orders   ? ? None  ? ?  ? ? ? ?Note:  This document was prepared using Dragon voice recognition software and may include unintentional dictation errors. ? ?  ?Versie Starks, PA-C ?12/27/21 1520 ? ?  ?Lucrezia Starch, MD ?12/27/21 1521 ? ?

## 2021-12-27 NOTE — ED Triage Notes (Signed)
Pt comes into the ED via EMS from [facility] memory care with a wound to the right hip, draining foul drainage. ? ?HR70 ?98%RA ?KG41 ?138/75 ? ?

## 2021-12-27 NOTE — Discharge Instructions (Signed)
Change the pressure dressing daily.  This should be changed every 24 hours.  Assess the area if the redness is spreading she will need to be placed on antibiotic.  At this time just will allow the wound to heal.  Return to the emergency department if worsening. ?

## 2022-01-24 IMAGING — CT CT HEAD W/O CM
3 series · 15 of 47 positions shown, 18 images · non-contrast
Comparison: Noncontrast head CT 12/11/2016

CLINICAL DATA: Encephalopathy. Additional provided: Altered mental
status, history of dementia.

EXAM:
CT HEAD WITHOUT CONTRAST
TECHNIQUE: Contiguous axial images were obtained from the base of the skull
through the vertex without intravenous contrast.

[Series 3: head wo · axial · 0.44mm/px · z∈[-160,-30]mm · 9 of 32 slices shown, 12 images]
[im 3/32  brain]
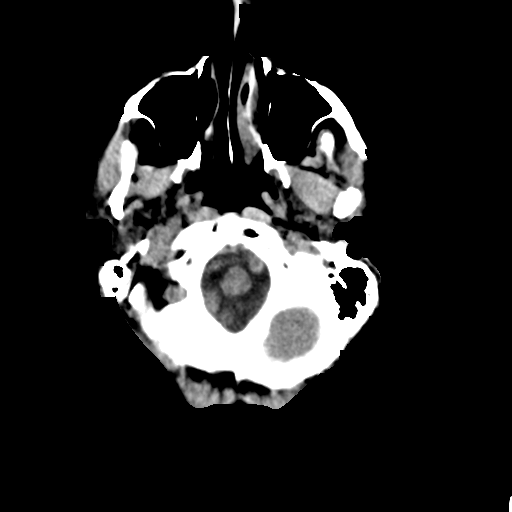
[im 3/32  bone]
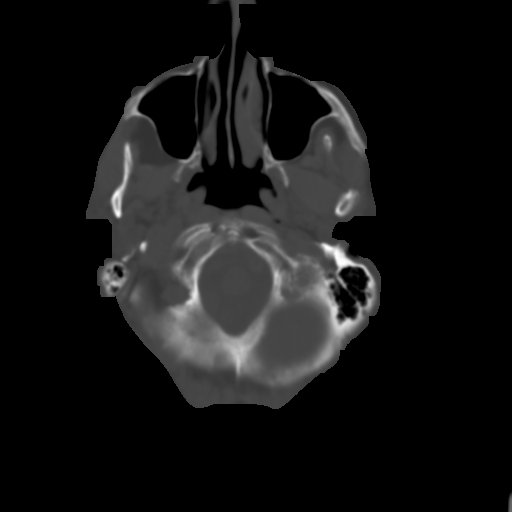
[im 6/32  brain]
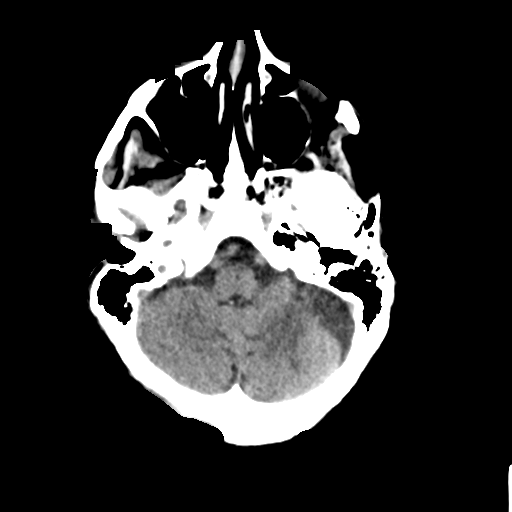
[im 9/32  brain]
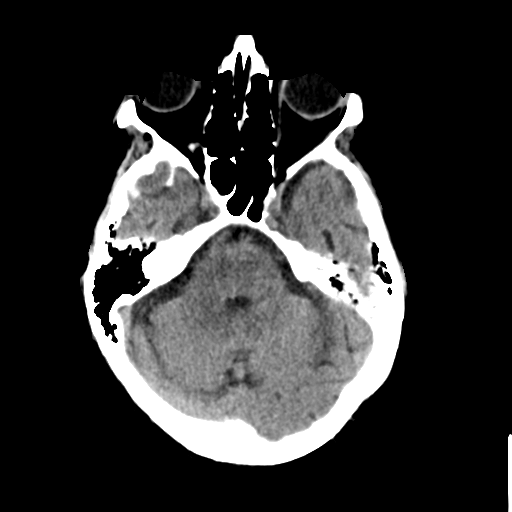
[im 12/32  brain]
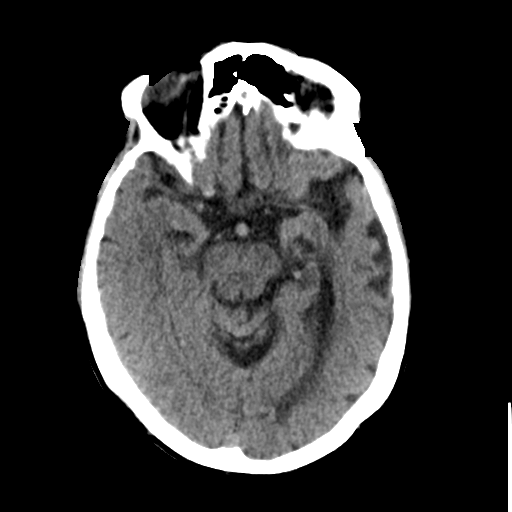
[im 17/32  brain]
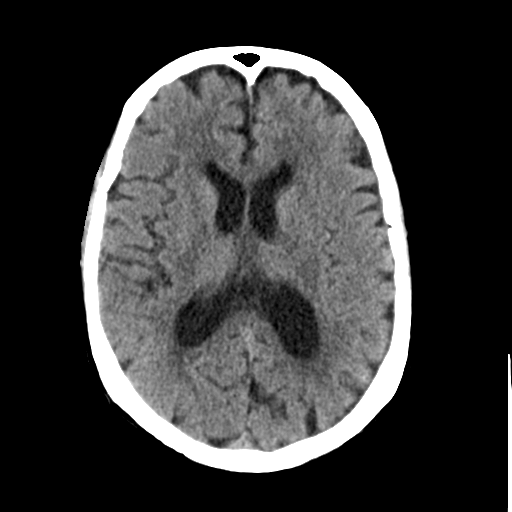
[im 17/32  bone]
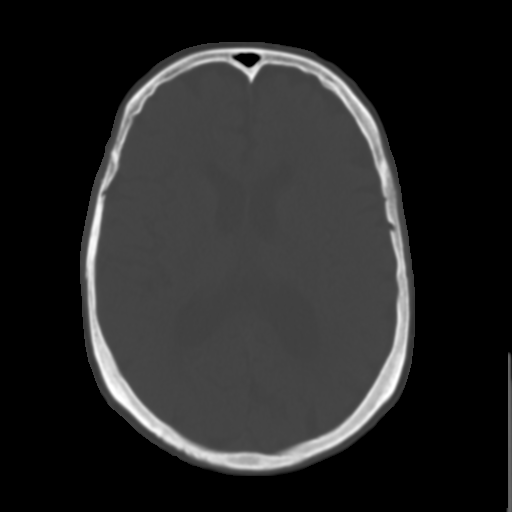
[im 20/32  brain]
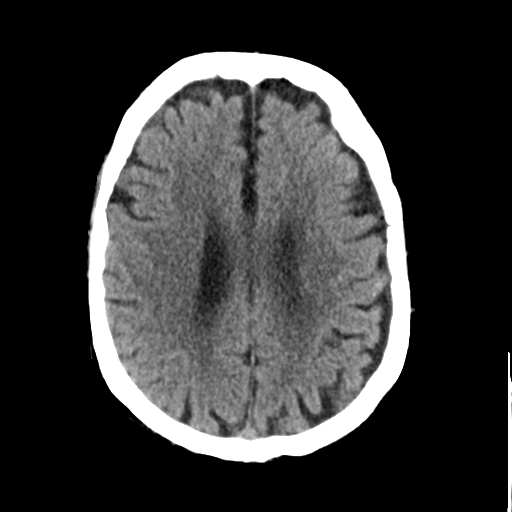
[im 23/32  brain]
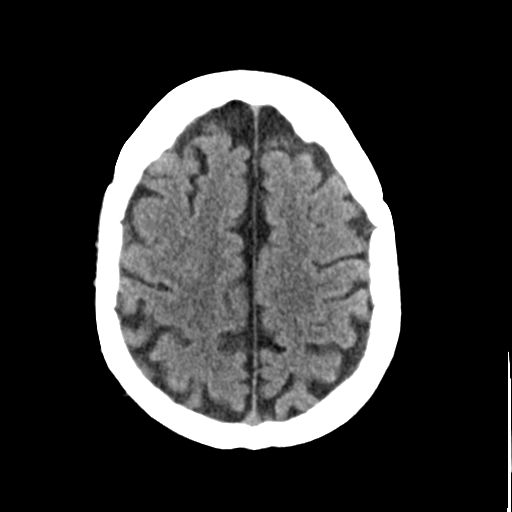
[im 26/32  brain]
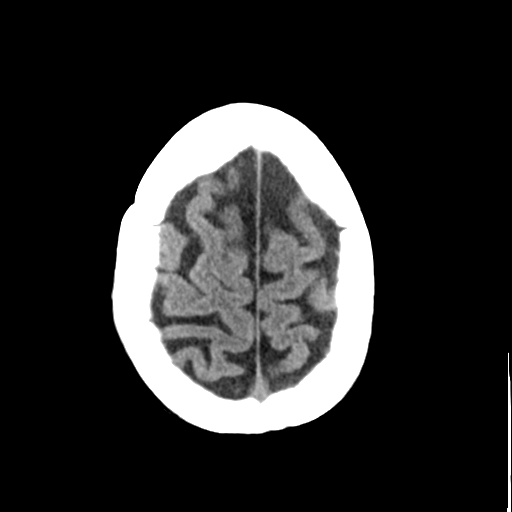
[im 29/32  brain]
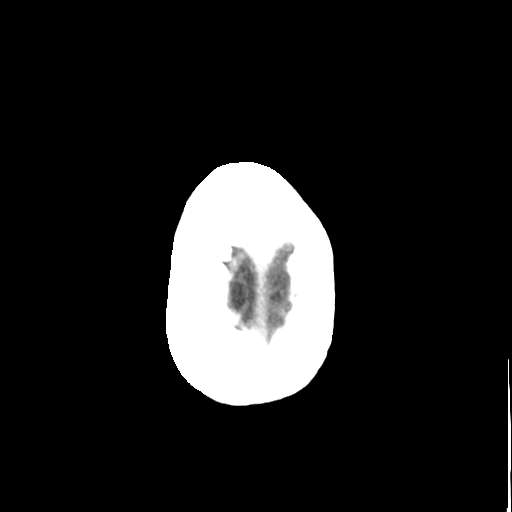
[im 29/32  bone]
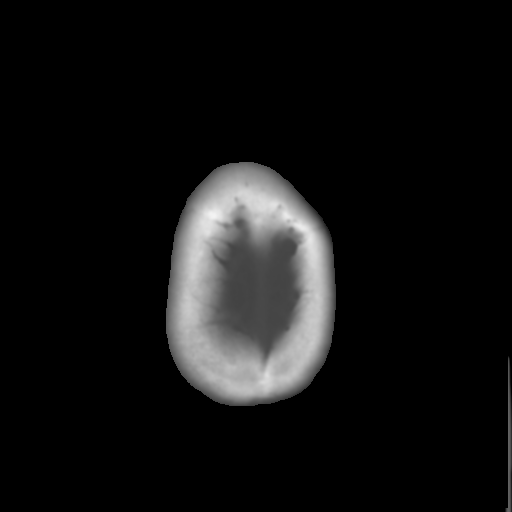

[Series 4: coronal soft tissue · coronal · 0.30mm/px · 3 of 65 slices shown]
[im 22/65  brain]
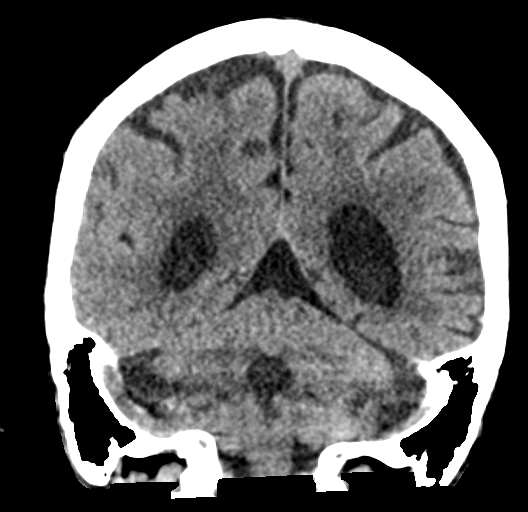
[im 29/65  brain]
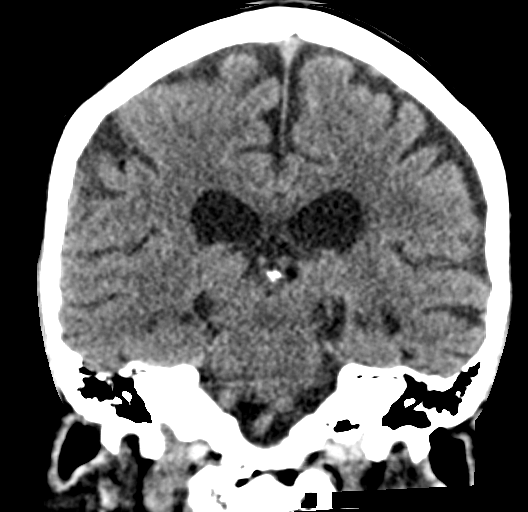
[im 36/65  brain]
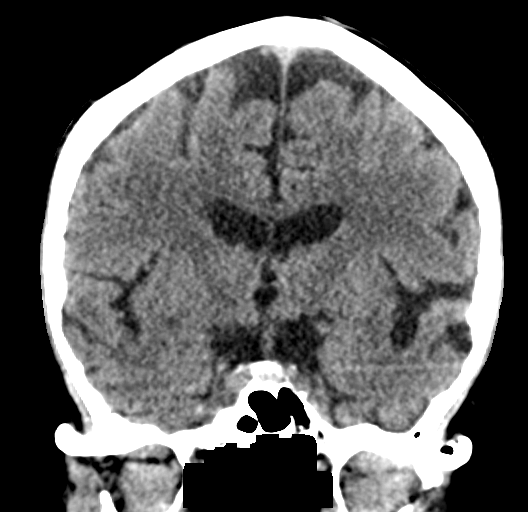

[Series 5: sagittal soft tissue · sagittal · 0.32mm/px · 3 of 49 slices shown]
[im 17/49  brain]
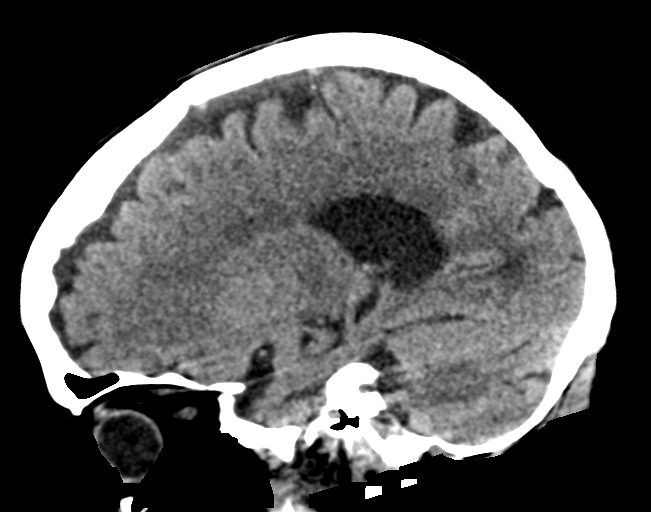
[im 25/49  brain]
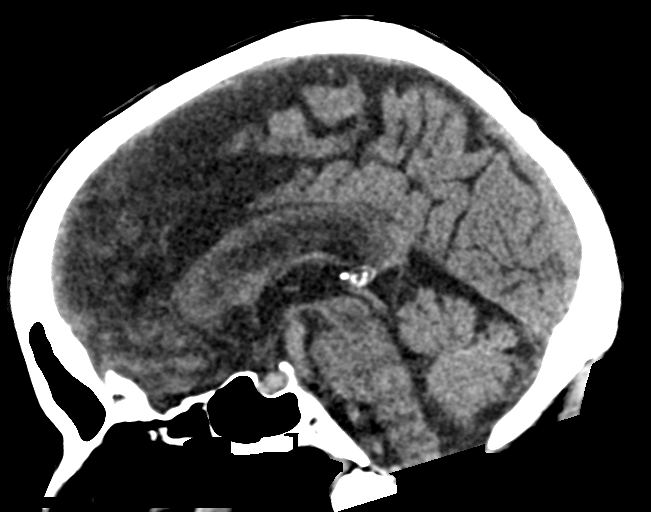
[im 33/49  brain]
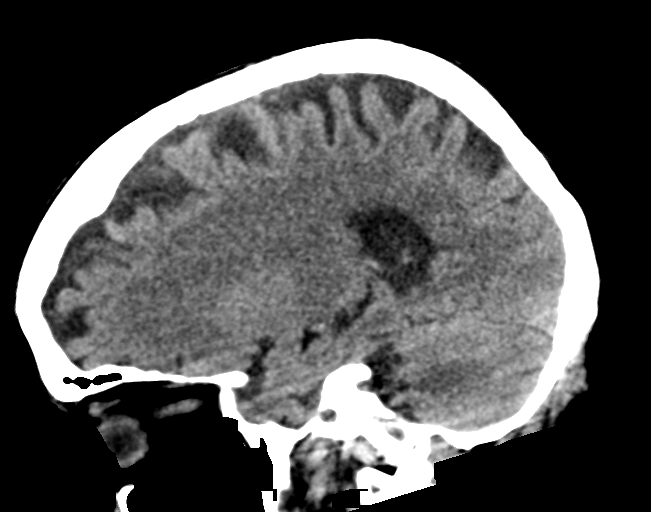

[15 of 47 positions shown; findings below may reference images not displayed]

FINDINGS: Brain:

Mild ill-defined hypoattenuation within the cerebral white matter is
nonspecific, but consistent with chronic small vessel ischemic
disease. Mild generalized parenchymal atrophy. A small chronic
lacunar infarct is questioned within the left cerebellar hemisphere
(series 4, image 54). Findings are stable as compared to prior head
CT 12/12/2019.

There is no acute intracranial hemorrhage.

No demarcated cortical infarct.

No extra-axial fluid collection.

No evidence of intracranial mass.

No midline shift.

Vascular: No hyperdense vessel.  Atherosclerotic calcifications.

Skull: Normal. Negative for fracture or focal lesion.

Sinuses/Orbits: Visualized orbits show no acute finding. No
significant paranasal sinus disease or mastoid effusion at the
imaged levels.
IMPRESSION: 1. No evidence of acute intracranial abnormality.
2. Stable, mild generalized parenchymal atrophy and background
chronic small vessel ischemic disease. A small chronic lacunar
infarct is questioned within the left cerebellum and, in retrospect,
this finding was present on prior head CT 12/12/2019.

## 2022-01-24 IMAGING — CR DG CHEST 1V PORT
1 series · 1 of 1 positions shown · non-contrast
Comparison: 12/12/2019

CLINICAL DATA: Shortness of breath

EXAM:
PORTABLE CHEST 1 VIEW

[dg chest port 1 view]
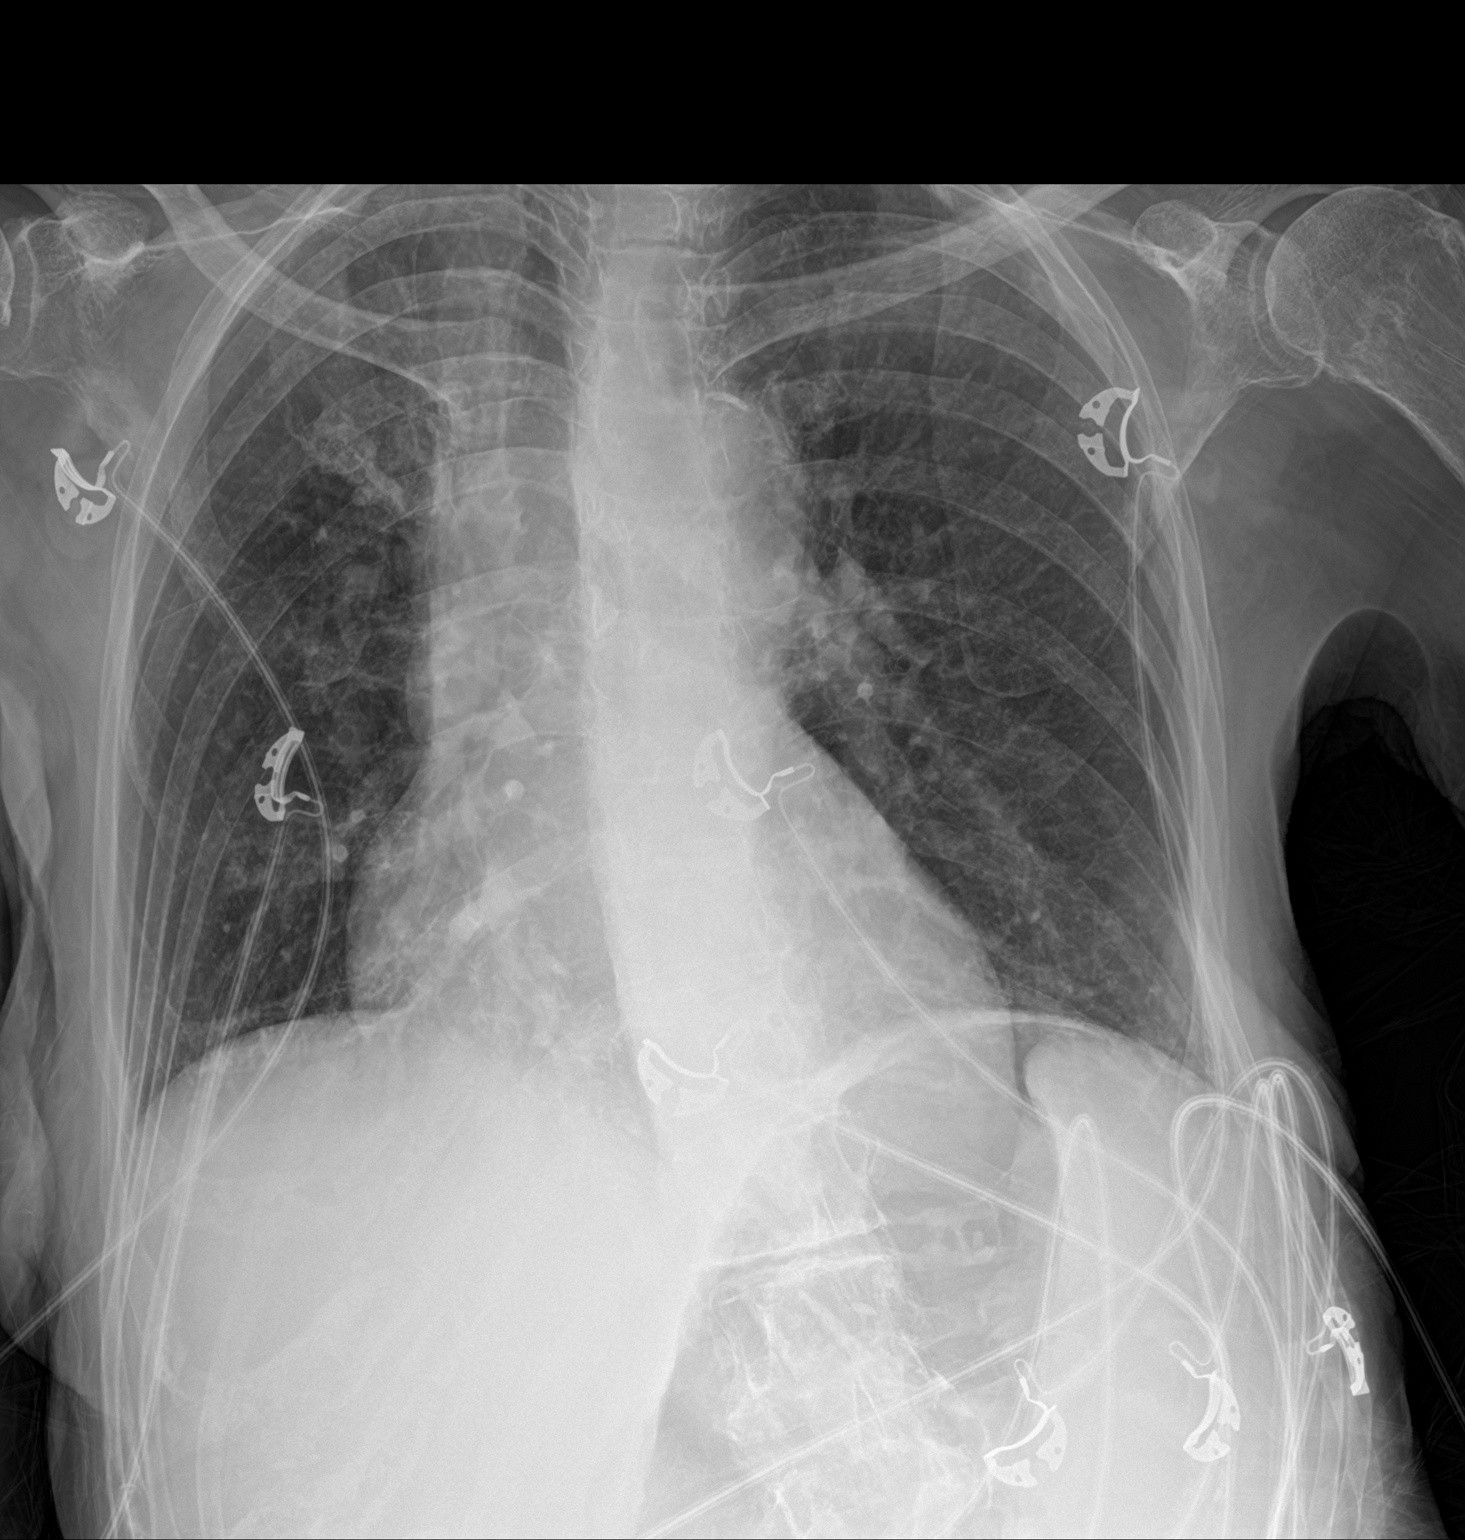

[1 of 1 positions shown; findings below may reference images not displayed]

FINDINGS: Patient is rotated. Chronic interstitial prominence. No new
consolidation or edema. Pleural effusion or pneumothorax. Stable
cardiomediastinal contours.
IMPRESSION: No acute process in the chest.

## 2022-02-21 ENCOUNTER — Other Ambulatory Visit: Payer: Self-pay | Admitting: *Deleted

## 2022-02-21 DIAGNOSIS — N200 Calculus of kidney: Secondary | ICD-10-CM

## 2022-03-27 ENCOUNTER — Ambulatory Visit: Payer: Self-pay | Admitting: Urology

## 2022-03-29 ENCOUNTER — Ambulatory Visit: Payer: Medicare PPO | Admitting: Urology

## 2022-04-03 ENCOUNTER — Ambulatory Visit: Payer: Medicare PPO | Admitting: Urology

## 2022-04-05 ENCOUNTER — Ambulatory Visit: Payer: Medicare PPO | Admitting: Urology

## 2022-11-03 ENCOUNTER — Encounter: Payer: Self-pay | Admitting: Emergency Medicine

## 2022-11-03 ENCOUNTER — Emergency Department
Admission: EM | Admit: 2022-11-03 | Discharge: 2022-11-04 | Disposition: A | Discharge status: Skilled Nursing Facility | Payer: Medicare PPO | Attending: Emergency Medicine | Admitting: Emergency Medicine

## 2022-11-03 ENCOUNTER — Emergency Department: Payer: Medicare PPO

## 2022-11-03 DIAGNOSIS — Z23 Encounter for immunization: Secondary | ICD-10-CM | POA: Diagnosis not present

## 2022-11-03 DIAGNOSIS — S0990XA Unspecified injury of head, initial encounter: Secondary | ICD-10-CM

## 2022-11-03 DIAGNOSIS — W01198A Fall on same level from slipping, tripping and stumbling with subsequent striking against other object, initial encounter: Secondary | ICD-10-CM | POA: Insufficient documentation

## 2022-11-03 DIAGNOSIS — Y92129 Unspecified place in nursing home as the place of occurrence of the external cause: Secondary | ICD-10-CM | POA: Diagnosis not present

## 2022-11-03 DIAGNOSIS — S0101XA Laceration without foreign body of scalp, initial encounter: Secondary | ICD-10-CM

## 2022-11-03 DIAGNOSIS — W19XXXA Unspecified fall, initial encounter: Secondary | ICD-10-CM

## 2022-11-03 MED ORDER — TETANUS-DIPHTH-ACELL PERTUSSIS 5-2.5-18.5 LF-MCG/0.5 IM SUSY
0.5000 mL | PREFILLED_SYRINGE | Freq: Once | INTRAMUSCULAR | Status: AC
  Administered 2022-11-03: 0.5 mL via INTRAMUSCULAR
  Filled 2022-11-03: qty 0.5

## 2022-11-03 NOTE — ED Notes (Signed)
Request made for [facility] to transport pt to [facility]

## 2022-11-03 NOTE — ED Provider Notes (Signed)
[facility] Provider Note  Patient Contact: 9:43 PM (approximate)   History   Fall   HPI  BRAXLEY Hanson is a 87 y.o. female who presents the emergency department from long-term care facility for report of a mechanical fall striking her head.  Reportedly no loss of consciousness.  Patient is pleasant but is demented and cannot answer questions at this time.  Information was passed via EMS from long-term care facility staff.  According to EMS staff reports that patient is at her baseline.  Patient did sustain a soft tissue injury to the top of her head.     Physical Exam   Triage Vital Signs: ED Triage Vitals  Enc Vitals Group     BP 11/03/22 2113 (!) 145/69     Pulse Rate 11/03/22 2113 62     Resp 11/03/22 2113 18     Temp 11/03/22 2113 98.1 F (36.7 C)     Temp Source 11/03/22 2113 Oral     SpO2 11/03/22 2113 100 %     Weight 11/03/22 2109 128 lb 4.9 oz (58.2 kg)     Height 11/03/22 2109 '5\' 7"'$  (1.702 m)     Head Circumference --      Peak Flow --      Pain Score --      Pain Loc --      Pain Edu? --      Excl. in [place]? --     Most recent vital signs: Vitals:   11/03/22 2113  BP: (!) 145/69  Pulse: 62  Resp: 18  Temp: 98.1 F (36.7 C)  SpO2: 100%     General: Alert and in no acute distress. Eyes:  PERRL. EOMI. Head: No acute traumatic findings.  Superficial scalp laceration noted.  Edges are well-approximated.  No active bleeding currently.  No other visible signs of trauma to the head.  No battle signs, raccoon eyes, serosanguineous fluid drainage from the ears or nares.  Neck: No stridor. No cervical spine tenderness to palpation.  Patient is freely moving cervical spine at this time  Cardiovascular:  Good peripheral perfusion Respiratory: Normal respiratory effort without tachypnea or retractions. Lungs CTAB.  Musculoskeletal: Full range of motion to all extremities.  Patient is moving all 4 extremities upon exam at this time.  No  obvious deformity.  No appreciable tenderness to palpation. Neurologic:  No gross focal neurologic deficits are appreciated.  Skin:   No rash noted Other:   ED Results / Procedures / Treatments   Labs (all labs ordered are listed, but only abnormal results are displayed) Labs Reviewed - No data to display   EKG     RADIOLOGY  I personally viewed, evaluated, and interpreted these images as part of my medical decision making, as well as reviewing the written report by the radiologist.  ED Provider Interpretation: No acute traumatic findings on CT scan of the head or cervical spine.  CT HEAD WO CONTRAST (5MM)  Result Date: 11/03/2022 CLINICAL DATA:  Mechanical fall tonight with laceration on top of head. No loss of consciousness. Dementia patient. EXAM: CT HEAD WITHOUT CONTRAST TECHNIQUE: Contiguous axial images were obtained from the base of the skull through the vertex without intravenous contrast. RADIATION DOSE REDUCTION: This exam was performed according to the departmental dose-optimization program which includes automated exposure control, adjustment of the mA and/or kV according to patient size and/or use of iterative reconstruction technique. COMPARISON:  01/07/2020 FINDINGS: Brain: No intracranial hemorrhage, mass  effect, or midline shift. No hydrocephalus. The basilar cisterns are patent. Slight progression in atrophy from 2021 exam. Similar chronic small vessel ischemic change. Remote lacunar left cerebellar infarct. No evidence of territorial infarct or acute ischemia. No extra-axial or intracranial fluid collection. Vascular: Atherosclerosis of skullbase vasculature without hyperdense vessel or abnormal calcification. Skull: No fracture or focal lesion. Sinuses/Orbits: No acute findings. Mild mucosal thickening of the sphenoid sinus. No mastoid effusion. Bilateral cataract resection. Other: Small left frontal scalp hematoma and laceration. IMPRESSION: 1. Small left frontal scalp  hematoma and laceration. No acute intracranial abnormality. No skull fracture. 2. Slight progression in atrophy from 2021 exam. Similar chronic small vessel ischemic change. 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 Electronically Signed   By: Keith Rake M.D.   On: 11/03/2022 21:42   CT Cervical Spine Wo Contrast  Result Date: 11/03/2022 CLINICAL DATA:  Mechanical fall with laceration to the top of head. History of dementia. EXAM: CT CERVICAL SPINE WITHOUT CONTRAST TECHNIQUE: Multidetector CT imaging of the cervical spine was performed without intravenous contrast. Multiplanar CT image reconstructions were also generated. RADIATION DOSE REDUCTION: This exam was performed according to the departmental dose-optimization program which includes automated exposure control, adjustment of the mA and/or kV according to patient size and/or use of iterative reconstruction technique. COMPARISON:  Radiograph 04/03/2014 FINDINGS: Alignment: No evidence of traumatic malalignment. Chronic retrolisthesis of C6. Skull base and vertebrae: No acute fracture. No primary bone lesion or focal pathologic process. Soft tissues and spinal canal: No prevertebral fluid or swelling. No visible canal hematoma. Disc levels: Advanced multilevel spondylosis, disc space height loss, degenerative endplate changes, greatest at C5-C6 and C6-C7. Multilevel facet arthropathy. Posterior disc osteophyte complexes at C5-C6 and C6-C7 cause mild effacement of the ventral thecal sac. No high-grade spinal canal narrowing. Uncovertebral spurring and facet arthropathy cause multilevel neural foraminal narrowing greatest on the right at C6-C7 where it is moderate-advanced. Upper chest: Biapical pleural-parenchymal scarring. No acute abnormality. Other: None. IMPRESSION: No acute fracture in the cervical spine. Multilevel spondylosis greatest at C5-C7. Electronically Signed   By: Placido Sou M.D.   On: 11/03/2022 21:41     PROCEDURES:  Critical Care performed: No  Procedures   MEDICATIONS ORDERED IN ED: Medications  Tdap (BOOSTRIX) injection 0.5 mL (has no administration in time range)     IMPRESSION / MDM / ASSESSMENT AND PLAN / ED COURSE  I reviewed the triage vital signs and the nursing notes.                                 Differential diagnosis includes, but is not limited to, fall, intracranial hemorrhage, skull fracture, cervical spine injury  Patient's presentation is most consistent with acute presentation with potential threat to life or bodily function.   Patient's diagnosis is consistent with fall, minor head injury, scalp laceration.  Patient presents emergency department after reported mechanical fall.  Patient has dementia and cannot answer questions.  Patient's history is provided by EMS from long-term care facility.  Patient is currently at her baseline according to EMS.  Patient had a superficial laceration to the scalp that does not require closure with sutures or staples at this time.  Soap and water will be appropriate for management.  Imaging of the head, cervical spine reveals no intracranial hemorrhage, skull fracture, cervical spine fracture.  Patient is moving all 4 extremities at this time, patient does not appear to be tender  to palpation of the extremities.  Again this was reported as a mechanical fall from long-term care facility.  Feel that patient is stable for discharge at this time..  Patient is given ED precautions to return to the ED for any worsening or new symptoms.     FINAL CLINICAL IMPRESSION(S) / ED DIAGNOSES   Final diagnoses:  Fall, initial encounter  Minor head injury, initial encounter  Laceration of scalp, initial encounter     Rx / DC Orders   ED Discharge Orders     None        Note:  This document was prepared using Dragon voice recognition software and may include unintentional dictation errors.   Brynda Peon 11/03/22 2149    Rada Hay, MD 11/04/22 [phone]

## 2022-11-03 NOTE — ED Triage Notes (Addendum)
Pt presents via [facility] from [facility] following a fall tonight - mechanical fall. Pt has a laceration to the top of her head - no LOC; bleeding well controlled. Not on thinners. Hx of dementia - Pt alert to self (baseline per facility). Denies CP or SOB.
